# Patient Record
Sex: Male | Born: 1935 | Race: White | Hispanic: No | Marital: Married | Smoking: Former smoker
Health system: Southern US, Community
[De-identification: ages and names within clinical notes are randomized; demographics above are authoritative.]

## PROBLEM LIST (undated history)

## (undated) DIAGNOSIS — M1990 Unspecified osteoarthritis, unspecified site: Secondary | ICD-10-CM

## (undated) DIAGNOSIS — K449 Diaphragmatic hernia without obstruction or gangrene: Secondary | ICD-10-CM

## (undated) DIAGNOSIS — J449 Chronic obstructive pulmonary disease, unspecified: Secondary | ICD-10-CM

## (undated) DIAGNOSIS — J189 Pneumonia, unspecified organism: Secondary | ICD-10-CM

## (undated) DIAGNOSIS — E785 Hyperlipidemia, unspecified: Secondary | ICD-10-CM

## (undated) DIAGNOSIS — T8859XA Other complications of anesthesia, initial encounter: Secondary | ICD-10-CM

## (undated) DIAGNOSIS — F0390 Unspecified dementia without behavioral disturbance: Secondary | ICD-10-CM

## (undated) DIAGNOSIS — T4145XA Adverse effect of unspecified anesthetic, initial encounter: Secondary | ICD-10-CM

## (undated) DIAGNOSIS — K219 Gastro-esophageal reflux disease without esophagitis: Secondary | ICD-10-CM

## (undated) DIAGNOSIS — R569 Unspecified convulsions: Secondary | ICD-10-CM

## (undated) DIAGNOSIS — D509 Iron deficiency anemia, unspecified: Secondary | ICD-10-CM

## (undated) DIAGNOSIS — I10 Essential (primary) hypertension: Secondary | ICD-10-CM

## (undated) DIAGNOSIS — I5023 Acute on chronic systolic (congestive) heart failure: Secondary | ICD-10-CM

## (undated) DIAGNOSIS — I480 Paroxysmal atrial fibrillation: Secondary | ICD-10-CM

## (undated) DIAGNOSIS — E6609 Other obesity due to excess calories: Secondary | ICD-10-CM

## (undated) DIAGNOSIS — I509 Heart failure, unspecified: Secondary | ICD-10-CM

## (undated) HISTORY — PX: JOINT REPLACEMENT: SHX530

## (undated) HISTORY — DX: Paroxysmal atrial fibrillation: I48.0

## (undated) HISTORY — DX: Iron deficiency anemia, unspecified: D50.9

## (undated) HISTORY — DX: Gastro-esophageal reflux disease without esophagitis: K21.9

## (undated) HISTORY — DX: Hyperlipidemia, unspecified: E78.5

## (undated) HISTORY — DX: Diaphragmatic hernia without obstruction or gangrene: K44.9

## (undated) HISTORY — DX: Essential (primary) hypertension: I10

## (undated) HISTORY — PX: TOTAL KNEE ARTHROPLASTY: SHX125

## (undated) HISTORY — DX: Other obesity due to excess calories: E66.09

## (undated) HISTORY — DX: Unspecified osteoarthritis, unspecified site: M19.90

---

## 1999-02-20 ENCOUNTER — Ambulatory Visit: Admission: RE | Admit: 1999-02-20 | Discharge: 1999-02-20 | Payer: Self-pay | Admitting: Family Medicine

## 1999-05-16 ENCOUNTER — Encounter: Payer: Self-pay | Admitting: Orthopaedic Surgery

## 1999-05-22 ENCOUNTER — Inpatient Hospital Stay (HOSPITAL_COMMUNITY): Admission: RE | Admit: 1999-05-22 | Discharge: 1999-05-29 | Payer: Self-pay | Admitting: Orthopaedic Surgery

## 1999-05-23 ENCOUNTER — Encounter: Payer: Self-pay | Admitting: Orthopaedic Surgery

## 1999-05-24 ENCOUNTER — Encounter: Payer: Self-pay | Admitting: Internal Medicine

## 1999-05-29 ENCOUNTER — Inpatient Hospital Stay: Admission: RE | Admit: 1999-05-29 | Discharge: 1999-06-02 | Payer: Self-pay | Admitting: *Deleted

## 1999-06-04 ENCOUNTER — Encounter: Admission: RE | Admit: 1999-06-04 | Discharge: 1999-08-08 | Payer: Self-pay | Admitting: Family Medicine

## 1999-09-25 ENCOUNTER — Ambulatory Visit (HOSPITAL_BASED_OUTPATIENT_CLINIC_OR_DEPARTMENT_OTHER): Admission: RE | Admit: 1999-09-25 | Discharge: 1999-09-25 | Payer: Self-pay | Admitting: Orthopaedic Surgery

## 2003-05-27 ENCOUNTER — Encounter (INDEPENDENT_AMBULATORY_CARE_PROVIDER_SITE_OTHER): Payer: Self-pay | Admitting: Specialist

## 2003-05-27 ENCOUNTER — Ambulatory Visit (HOSPITAL_COMMUNITY): Admission: RE | Admit: 2003-05-27 | Discharge: 2003-05-27 | Payer: Self-pay | Admitting: Gastroenterology

## 2003-08-23 ENCOUNTER — Inpatient Hospital Stay (HOSPITAL_COMMUNITY): Admission: RE | Admit: 2003-08-23 | Discharge: 2003-08-30 | Payer: Self-pay | Admitting: Orthopedic Surgery

## 2003-08-26 ENCOUNTER — Encounter (INDEPENDENT_AMBULATORY_CARE_PROVIDER_SITE_OTHER): Payer: Self-pay | Admitting: Cardiology

## 2003-11-15 ENCOUNTER — Encounter: Admission: RE | Admit: 2003-11-15 | Discharge: 2003-11-25 | Payer: Self-pay | Admitting: Family Medicine

## 2003-11-22 ENCOUNTER — Encounter: Admission: RE | Admit: 2003-11-22 | Discharge: 2003-11-22 | Payer: Self-pay | Admitting: Family Medicine

## 2003-11-25 ENCOUNTER — Observation Stay (HOSPITAL_COMMUNITY): Admission: EM | Admit: 2003-11-25 | Discharge: 2003-11-27 | Payer: Self-pay

## 2003-12-05 ENCOUNTER — Encounter: Admission: RE | Admit: 2003-12-05 | Discharge: 2004-03-04 | Payer: Self-pay | Admitting: Family Medicine

## 2005-07-29 HISTORY — PX: CARDIOVASCULAR STRESS TEST: SHX262

## 2005-11-26 ENCOUNTER — Encounter: Admission: RE | Admit: 2005-11-26 | Discharge: 2005-11-26 | Payer: Self-pay | Admitting: Orthopedic Surgery

## 2005-12-06 ENCOUNTER — Encounter: Admission: RE | Admit: 2005-12-06 | Discharge: 2005-12-06 | Payer: Self-pay | Admitting: Orthopedic Surgery

## 2005-12-10 ENCOUNTER — Encounter: Admission: RE | Admit: 2005-12-10 | Discharge: 2005-12-10 | Payer: Self-pay | Admitting: Orthopedic Surgery

## 2006-02-11 ENCOUNTER — Inpatient Hospital Stay (HOSPITAL_COMMUNITY): Admission: RE | Admit: 2006-02-11 | Discharge: 2006-02-14 | Payer: Self-pay | Admitting: Orthopedic Surgery

## 2006-03-10 ENCOUNTER — Encounter: Admission: RE | Admit: 2006-03-10 | Discharge: 2006-04-14 | Payer: Self-pay | Admitting: Orthopedic Surgery

## 2006-03-26 ENCOUNTER — Inpatient Hospital Stay (HOSPITAL_COMMUNITY): Admission: RE | Admit: 2006-03-26 | Discharge: 2006-03-30 | Payer: Self-pay | Admitting: Orthopedic Surgery

## 2006-04-21 ENCOUNTER — Encounter: Admission: RE | Admit: 2006-04-21 | Discharge: 2006-05-27 | Payer: Self-pay | Admitting: Orthopedic Surgery

## 2007-08-27 ENCOUNTER — Encounter: Admission: RE | Admit: 2007-08-27 | Discharge: 2007-08-27 | Payer: Self-pay | Admitting: Orthopedic Surgery

## 2007-10-26 ENCOUNTER — Inpatient Hospital Stay (HOSPITAL_COMMUNITY): Admission: RE | Admit: 2007-10-26 | Discharge: 2007-10-31 | Payer: Self-pay | Admitting: Orthopedic Surgery

## 2007-10-26 ENCOUNTER — Encounter (INDEPENDENT_AMBULATORY_CARE_PROVIDER_SITE_OTHER): Payer: Self-pay | Admitting: Orthopedic Surgery

## 2007-11-16 ENCOUNTER — Encounter: Admission: RE | Admit: 2007-11-16 | Discharge: 2008-02-14 | Payer: Self-pay | Admitting: Orthopedic Surgery

## 2008-09-16 ENCOUNTER — Ambulatory Visit (HOSPITAL_COMMUNITY): Admission: RE | Admit: 2008-09-16 | Discharge: 2008-09-16 | Payer: Self-pay | Admitting: Family Medicine

## 2009-05-18 ENCOUNTER — Ambulatory Visit (HOSPITAL_COMMUNITY): Admission: RE | Admit: 2009-05-18 | Discharge: 2009-05-18 | Payer: Self-pay | Admitting: Orthopedic Surgery

## 2009-05-31 ENCOUNTER — Encounter: Admission: RE | Admit: 2009-05-31 | Discharge: 2009-06-19 | Payer: Self-pay | Admitting: Orthopedic Surgery

## 2009-08-29 HISTORY — PX: US ECHOCARDIOGRAPHY: HXRAD669

## 2009-09-14 ENCOUNTER — Inpatient Hospital Stay (HOSPITAL_COMMUNITY): Admission: EM | Admit: 2009-09-14 | Discharge: 2009-09-18 | Payer: Self-pay | Admitting: Emergency Medicine

## 2009-09-15 ENCOUNTER — Encounter (INDEPENDENT_AMBULATORY_CARE_PROVIDER_SITE_OTHER): Payer: Self-pay | Admitting: Internal Medicine

## 2009-10-25 LAB — CBC AND DIFFERENTIAL: Hemoglobin: 14.2 g/dL (ref 13.5–17.5)

## 2010-01-12 ENCOUNTER — Encounter: Admission: RE | Admit: 2010-01-12 | Discharge: 2010-01-12 | Payer: Self-pay | Admitting: Cardiology

## 2010-01-25 ENCOUNTER — Ambulatory Visit (HOSPITAL_COMMUNITY): Admission: RE | Admit: 2010-01-25 | Discharge: 2010-01-25 | Payer: Self-pay | Admitting: Cardiology

## 2010-01-25 HISTORY — PX: CARDIOVERSION: SHX1299

## 2010-03-02 ENCOUNTER — Ambulatory Visit: Payer: Self-pay | Admitting: Cardiology

## 2010-03-29 ENCOUNTER — Ambulatory Visit: Payer: Self-pay | Admitting: Cardiology

## 2010-04-30 ENCOUNTER — Ambulatory Visit: Payer: Self-pay | Admitting: Cardiology

## 2010-05-29 ENCOUNTER — Ambulatory Visit: Payer: Self-pay | Admitting: Cardiology

## 2010-08-06 ENCOUNTER — Encounter: Payer: Self-pay | Admitting: Cardiology

## 2010-08-06 DIAGNOSIS — I482 Chronic atrial fibrillation, unspecified: Secondary | ICD-10-CM | POA: Insufficient documentation

## 2010-08-06 DIAGNOSIS — E6609 Other obesity due to excess calories: Secondary | ICD-10-CM | POA: Insufficient documentation

## 2010-08-06 DIAGNOSIS — I10 Essential (primary) hypertension: Secondary | ICD-10-CM | POA: Insufficient documentation

## 2010-08-06 DIAGNOSIS — E118 Type 2 diabetes mellitus with unspecified complications: Secondary | ICD-10-CM | POA: Insufficient documentation

## 2010-08-29 ENCOUNTER — Ambulatory Visit (INDEPENDENT_AMBULATORY_CARE_PROVIDER_SITE_OTHER): Payer: 59 | Admitting: *Deleted

## 2010-08-29 DIAGNOSIS — I4892 Unspecified atrial flutter: Secondary | ICD-10-CM

## 2010-10-19 LAB — URINALYSIS, ROUTINE W REFLEX MICROSCOPIC
Bilirubin Urine: NEGATIVE
Glucose, UA: NEGATIVE mg/dL
Hgb urine dipstick: NEGATIVE
Ketones, ur: NEGATIVE mg/dL
Protein, ur: NEGATIVE mg/dL

## 2010-10-19 LAB — PROTIME-INR
INR: 1.11 (ref 0.00–1.49)
Prothrombin Time: 14.2 seconds (ref 11.6–15.2)

## 2010-10-19 LAB — BASIC METABOLIC PANEL WITH GFR
BUN: 15 mg/dL (ref 6–23)
CO2: 27 meq/L (ref 19–32)
Calcium: 8.6 mg/dL (ref 8.4–10.5)
Chloride: 105 meq/L (ref 96–112)
Creatinine, Ser: 0.82 mg/dL (ref 0.4–1.5)
GFR calc non Af Amer: >60 mL/min
Glucose, Bld: 112 mg/dL — ABNORMAL HIGH (ref 70–99)
Potassium: 3.9 meq/L (ref 3.5–5.1)
Sodium: 139 meq/L (ref 135–145)

## 2010-10-19 LAB — PROTEIN ELECTROPHORESIS, SERUM
Albumin ELP: 58.7 % (ref 55.8–66.1)
Alpha-1-Globulin: 6.3 % — ABNORMAL HIGH (ref 2.9–4.9)
Alpha-2-Globulin: 12.9 % — ABNORMAL HIGH (ref 7.1–11.8)
Beta 2: 3.8 % (ref 3.2–6.5)
Beta Globulin: 8.3 % — ABNORMAL HIGH (ref 4.7–7.2)
Gamma Globulin: 10 % — ABNORMAL LOW (ref 11.1–18.8)
M-Spike, %: NOT DETECTED g/dL
Total Protein ELP: 6.3 g/dL (ref 6.0–8.3)

## 2010-10-19 LAB — CBC
HCT: 23.1 % — ABNORMAL LOW (ref 39.0–52.0)
Hemoglobin: 7.1 g/dL — ABNORMAL LOW (ref 13.0–17.0)
Hemoglobin: 8.6 g/dL — ABNORMAL LOW (ref 13.0–17.0)
Hemoglobin: 8.7 g/dL — ABNORMAL LOW (ref 13.0–17.0)
MCHC: 30.7 g/dL (ref 30.0–36.0)
MCHC: 30.8 g/dL (ref 30.0–36.0)
Platelets: 259 10*3/uL (ref 150–400)
RBC: 3.81 MIL/uL — ABNORMAL LOW (ref 4.22–5.81)
RBC: 4.05 MIL/uL — ABNORMAL LOW (ref 4.22–5.81)
RBC: 4.06 MIL/uL — ABNORMAL LOW (ref 4.22–5.81)
RDW: 21.6 % — ABNORMAL HIGH (ref 11.5–15.5)
RDW: 23.2 % — ABNORMAL HIGH (ref 11.5–15.5)
RDW: 28.3 % — ABNORMAL HIGH (ref 11.5–15.5)
WBC: 9.2 10*3/uL (ref 4.0–10.5)

## 2010-10-19 LAB — GLUCOSE, CAPILLARY
Glucose-Capillary: 104 mg/dL — ABNORMAL HIGH (ref 70–99)
Glucose-Capillary: 106 mg/dL — ABNORMAL HIGH (ref 70–99)
Glucose-Capillary: 106 mg/dL — ABNORMAL HIGH (ref 70–99)
Glucose-Capillary: 113 mg/dL — ABNORMAL HIGH (ref 70–99)
Glucose-Capillary: 114 mg/dL — ABNORMAL HIGH (ref 70–99)
Glucose-Capillary: 114 mg/dL — ABNORMAL HIGH (ref 70–99)
Glucose-Capillary: 115 mg/dL — ABNORMAL HIGH (ref 70–99)
Glucose-Capillary: 125 mg/dL — ABNORMAL HIGH (ref 70–99)
Glucose-Capillary: 127 mg/dL — ABNORMAL HIGH (ref 70–99)
Glucose-Capillary: 50 mg/dL — ABNORMAL LOW (ref 70–99)
Glucose-Capillary: 66 mg/dL — ABNORMAL LOW (ref 70–99)
Glucose-Capillary: 70 mg/dL (ref 70–99)
Glucose-Capillary: 99 mg/dL (ref 70–99)

## 2010-10-19 LAB — TISSUE TRANSGLUTAMINASE, IGA: Tissue Transglutaminase Ab, IgA: 0.2 U/mL (ref <7)

## 2010-10-19 LAB — CARDIAC PANEL(CRET KIN+CKTOT+MB+TROPI)
CK, MB: 1.3 ng/mL (ref 0.3–4.0)
CK, MB: 1.4 ng/mL (ref 0.3–4.0)
Relative Index: INVALID (ref 0.0–2.5)
Relative Index: INVALID (ref 0.0–2.5)
Total CK: 66 U/L (ref 7–232)
Total CK: 69 U/L (ref 7–232)

## 2010-10-19 LAB — BASIC METABOLIC PANEL
BUN: 20 mg/dL (ref 6–23)
CO2: 27 mEq/L (ref 19–32)
Calcium: 8.9 mg/dL (ref 8.4–10.5)
Creatinine, Ser: 0.9 mg/dL (ref 0.4–1.5)
GFR calc Af Amer: >60 mL/min (ref >60)
GFR calc non Af Amer: >60 mL/min (ref >60)
GFR calc non Af Amer: >60 mL/min (ref >60)
Glucose, Bld: 78 mg/dL (ref 70–99)
Potassium: 4.5 mEq/L (ref 3.5–5.1)
Sodium: 138 mEq/L (ref 135–145)

## 2010-10-19 LAB — CROSSMATCH: Antibody Screen: NEGATIVE

## 2010-10-19 LAB — RETICULIN ANTIBODIES, IGA W TITER: Reticulin Ab, IgA: NEGATIVE

## 2010-10-19 LAB — ERYTHROPOIETIN: Erythropoietin: 140 m[IU]/mL — ABNORMAL HIGH (ref 2.6–34.0)

## 2010-10-19 LAB — PREPARE RBC (CROSSMATCH)

## 2010-10-19 LAB — DIFFERENTIAL
Basophils Relative: 0 % (ref 0–1)
Eosinophils Absolute: 0.1 10*3/uL (ref 0.0–0.7)
Lymphocytes Relative: 21 % (ref 12–46)
Neutrophils Relative %: 69 % (ref 43–77)

## 2010-10-19 LAB — TISSUE TRANSGLUTAMINASE, IGG: t-Transglutaminase (tTG) IgG: 0.1 U/mL

## 2010-10-19 LAB — POCT CARDIAC MARKERS
CKMB, poc: 1 ng/mL (ref 1.0–8.0)
Myoglobin, poc: 65.7 ng/mL (ref 12–200)

## 2010-10-19 LAB — IRON AND TIBC: Iron: 50 ug/dL (ref 42–135)

## 2010-10-19 LAB — COMPREHENSIVE METABOLIC PANEL
BUN: 22 mg/dL (ref 6–23)
CO2: 26 mEq/L (ref 19–32)
Calcium: 8.9 mg/dL (ref 8.4–10.5)
Creatinine, Ser: 0.81 mg/dL (ref 0.4–1.5)
GFR calc non Af Amer: >60 mL/min (ref >60)
Glucose, Bld: 80 mg/dL (ref 70–99)

## 2010-10-19 LAB — GLIADIN ANTIBODIES, SERUM: Gliadin IgG: 0.1 U/mL (ref <7)

## 2010-10-19 LAB — URINE CULTURE: Culture: NO GROWTH

## 2010-10-19 LAB — RETICULOCYTES
RBC.: 4.56 MIL/uL (ref 4.22–5.81)
Retic Count, Absolute: 50.2 10*3/uL (ref 19.0–186.0)
Retic Ct Pct: 1.1 % (ref 0.4–3.1)

## 2010-10-19 LAB — VITAMIN B12: Vitamin B-12: 262 pg/mL (ref 211–911)

## 2010-10-21 ENCOUNTER — Other Ambulatory Visit: Payer: Self-pay | Admitting: Cardiology

## 2010-10-21 DIAGNOSIS — I10 Essential (primary) hypertension: Secondary | ICD-10-CM

## 2010-10-25 NOTE — Telephone Encounter (Signed)
escribe request  

## 2010-10-25 NOTE — Telephone Encounter (Signed)
[facility] °

## 2010-11-13 LAB — CROSSMATCH: ABO/RH(D): O NEG

## 2010-11-13 LAB — GLUCOSE, CAPILLARY: Glucose-Capillary: 134 mg/dL — ABNORMAL HIGH (ref 70–99)

## 2010-11-26 ENCOUNTER — Encounter: Payer: Self-pay | Admitting: Cardiology

## 2010-12-03 ENCOUNTER — Encounter: Payer: Self-pay | Admitting: Nurse Practitioner

## 2010-12-03 ENCOUNTER — Ambulatory Visit (INDEPENDENT_AMBULATORY_CARE_PROVIDER_SITE_OTHER): Payer: 59 | Admitting: Nurse Practitioner

## 2010-12-03 DIAGNOSIS — I10 Essential (primary) hypertension: Secondary | ICD-10-CM

## 2010-12-03 DIAGNOSIS — R609 Edema, unspecified: Secondary | ICD-10-CM

## 2010-12-03 DIAGNOSIS — I4891 Unspecified atrial fibrillation: Secondary | ICD-10-CM

## 2010-12-03 NOTE — Assessment & Plan Note (Signed)
Blood pressure is marginal. He is not really interested in more medicines. I encouraged him to work harder on his diet and weight loss.

## 2010-12-03 NOTE — Patient Instructions (Addendum)
Stay on your current medicines. Try to limit your salt use. Please wear your support stockings for your swelling. I will have you see Dr. Patty Sermons in about 3 months.

## 2010-12-03 NOTE — Assessment & Plan Note (Signed)
I encouraged him to use his support stockings and to try and watch his salt. He is not interested in further medicines at this point. I will have him see Dr. Patty Sermons in about 2 months. Patient is agreeable to this plan and will call if any problems develop in the interim.

## 2010-12-03 NOTE — Assessment & Plan Note (Signed)
He remains in sinus by clinical exam. Protimes are checked by his PCP.

## 2010-12-03 NOTE — Progress Notes (Signed)
Omar Chan Date of Birth: 02-Jul-1926   History of Present Illness: Omar Chan is seen today for his 2 month visit. He is seen for Dr. Patty Chan.  Overall he seems to be holding his own. His only concern is of lower extremity edema. He tries to watch his salt but I get the impression that he probably has more than he should. He does not wear his support stockings. He is on HCTZ. It does go down somewhat overnight. He is not really interested in other medicines. He is not having chest pain. He has chronic shortness of breath. He is tolerating his medicines. He will be seeing Dr. Clarene Chan on Wednesday with labs and a visit.  Current Outpatient Prescriptions on File Prior to Visit  Medication Sig Dispense Refill  . acetaminophen (TYLENOL) 500 MG tablet Take 500 mg by mouth as needed.        Marland Kitchen atorvastatin (LIPITOR) 80 MG tablet Take 80 mg by mouth daily.        Marland Kitchen diltiazem (CARDIZEM CD) 180 MG 24 hr capsule Take 180 mg by mouth daily.        . ferrous sulfate 325 (65 FE) MG EC tablet Take 325 mg by mouth daily.       Marland Kitchen gemfibrozil (LOPID) 600 MG tablet Take 600 mg by mouth 2 (two) times daily before a meal.        . glipiZIDE (GLUCOTROL) 10 MG tablet Take 10 mg by mouth 2 (two) times daily before a meal.        . hydrochlorothiazide 25 MG tablet Take 25 mg by mouth daily.        Marland Kitchen lisinopril (PRINIVIL,ZESTRIL) 40 MG tablet Take 40 mg by mouth daily.        . metFORMIN (GLUCOPHAGE) 500 MG tablet Take 500 mg by mouth every morning.        . metFORMIN (GLUCOPHAGE) 500 MG tablet Take 1,000 mg by mouth nightly.        . metoprolol tartrate (LOPRESSOR) 25 MG tablet TAKE 1 TABLET TWICE A DAY  180 tablet  2  . warfarin (COUMADIN) 5 MG tablet Take 5 mg by mouth as directed.        . famotidine-calcium carbonate-magnesium hydroxide (PEPCID COMPLETE) 10-800-165 MG CHEW Chew 1 tablet by mouth daily as needed.          Allergies  Allergen Reactions  . Aspirin     Past Medical History  Diagnosis Date    . A-fib     cardioversion 01/25/10  . Diabetes mellitus   . Exogenous obesity   . Hypertension     Past Surgical History  Procedure Date  . Total knee arthroplasty     B    History  Smoking status  . Former Smoker  . Quit date: 07/29/1981  Smokeless tobacco  . Not on file    History  Alcohol Use No    Family History  Problem Relation Age of Onset  . Cancer Mother     deceased  . Pneumonia Father     deceased  . ALS Brother     deceased  . Cancer Brother     deceased/bone ca  . Cancer Sister     deceased    Review of Systems: The review of systems is positive for chronic DOE. He is not having palpitations. He thinks his rhythm has been ok. Coumadin is checked by Dr. Clarene Chan. He is not having any adverse bleeding or  bruising. He has not fallen. Gait is a little unsteady due to his arthritis.  All other systems were reviewed and are negative.  Physical Exam: BP 150/80  Pulse 60  Wt 272 lb (123.378 kg) Patient is very pleasant and in no acute distress. He is obese. Skin is warm and dry. Color is normal.  HEENT is unremarkable. Normocephalic/atraumatic. PERRL. Sclera are nonicteric. Neck is supple. No masses. No JVD. Lungs are clear. Cardiac exam shows a regular rate and rhythm. He does have an outflow murmur noted.  Abdomen is obese and soft. Extremities are with trace edema bilaterally.  Gait and ROM are intact. He is using a cane.  No gross neurologic deficits noted.  LABORATORY DATA: N/A   Assessment / Plan:

## 2010-12-11 NOTE — H&P (Signed)
Omar Chan, Omar Chan                ACCOUNT NO.:  [id number]   MEDICAL RECORD NO.:  [id number]          PATIENT TYPE:  INP   LOCATION:  NA                           FACILITY:  [facility]   PHYSICIAN:  Madlyn Frankel. Charlann Boxer, M.D.  DATE OF BIRTH:  06/12/1926   DATE OF ADMISSION:  10/26/2007  DATE OF DISCHARGE:                              HISTORY & PHYSICAL   PROCEDURE:  Revision right total knee arthroplasty.   CHIEF COMPLAINTS:  Right knee pain.   HISTORY OF PRESENT ILLNESS:  A 77 year old male with a history of  painful and swollen right total knee with bone scan revealing loose  prosthesis.  Lab work was negative for infection.  He has been  presurgically assessed for revision right total knee with  recommendations for close monitoring of his diabetes which is not well  controlled and spinal anesthesia preferable to general.   PAST MEDICAL HISTORY:  Significant for:  1. Osteoarthritis.  2. Diabetes, non-insulin dependent.  3. Hypertension.  4. Dyslipidemia.   PAST SURGICAL HISTORY:  1. Left knee replacement in 2000.  2. Right knee replacement in 2005.   FAMILY HISTORY:  Lung cancer, Lou Gehrig's disease.   SOCIAL HISTORY:  Married, retired.  Primary caregiver will be spouse  versus SNF postoperatively.   DRUG ALLERGIES:  ASPIRIN.   MEDICATIONS:  1. Gemfibrozil 600 mg one p.o. b.i.d.  2. Lipitor 80 mg one p.o. daily.  3. Prazosin capsules 2 mg two p.o. b.i.d.  4. Hydrochlorothiazide 25 mg 1/2 tablet daily.  5. Lisinopril 20 mg two p.o. daily.  6. Glipizide 10 mg one p.o. b.i.d.  7. Metformin 500 mg one p.o. daily.  8. Tylenol p.r.n..   Verify dosages and frequencies with the patient at time of admission.   REVIEW OF SYSTEMS:  HEMATOLOGY-ONCOLOGY:  Bruises easily.  Otherwise see  History of Present Illness.   PHYSICAL EXAMINATION:  VITAL SIGNS:  Pulse 84, respirations 18, blood  pressure 142/70.  GENERAL:  Awake, alert and oriented, mild distress.  NECK:  Supple.  No  carotid bruits.  CHEST:  Lungs clear to auscultation bilaterally.  BREASTS:  Deferred.  HEART:  Regular rate and rhythm.  S1-S2 distinct.  ABDOMEN:  Soft, nontender, bowel sounds present.  GENITOURINARY:  Deferred.  EXTREMITIES:  Uses a single-point cane.  Unable to palpate dorsalis  pedis pulse.  He does have a brisk capillary refill.  Bilateral lower  extremity edema to mid calf.  SKIN:  No signs of cellulitis.  NEUROLOGIC:  Intact distal sensibilities.   Labs, EKG, chest x-ray pending presurgical testing.   IMPRESSION:  Aseptic loosening right total knee arthroplasty.   PLAN OF ACTION:  Revision right total knee arthroplasty at [facility] on October 26, 2007, by surgeon Dr. Durene Romans.  Risks and  complications were discussed.  Dr. Charlann Boxer plans on a hypertype fixation  with Press-Fit stems, cementing proximal portion of the tibia and femur.   Postoperative medications were not dispensed at time of History and  Physical as he may require SNF placement postoperatively.     ______________________________  Sharlet Salina  Chanetta Marshall, PA      [name], M.D.  Electronically Signed    BLM/MEDQ  D:  10/19/2007  T:  10/19/2007  Job:  [id number]   cc:   Chales Salmon. Abigail Miyamoto, M.D.  Fax: [phone]

## 2010-12-11 NOTE — Discharge Summary (Signed)
NAMESAMANTHA, Omar Chan                ACCOUNT NO.:  [id number]   MEDICAL RECORD NO.:  [id number]          PATIENT TYPE:  INP   LOCATION:  1612                         FACILITY:  [facility]   PHYSICIAN:  Madlyn Frankel. Charlann Boxer, M.D.  DATE OF BIRTH:  10/30/1925   DATE OF ADMISSION:  10/26/2007  DATE OF DISCHARGE:  10/31/2007                               DISCHARGE SUMMARY   ADMITTING DIAGNOSES:  1. Failed right total knee arthroplasty.  2. Osteoarthritis.  3. Diabetes.  4. Hypertension.  5. Dyslipidemia.   DISCHARGE DIAGNOSIS:  1. 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  2. Osteoarthritis.  3. Diabetes.  4. Hypertension.  5. Dyslipidemia.  6. Urinary retention.  7. Hypoglycemia.   HISTORY OF PRESENT ILLNESS:  A 77 year old male with a history painful  and swollen right total knee with bone scan revealing loose prosthesis.  Lab work was negative for infection.  Presurgically assessed for  revision right total knee replacement.  Close monitoring of diabetes  recommended as well spinal anesthesia preferable to general.   CONSULTANTS:  Dr. Rito Ehrlich for elevated heart rate.   PROCEDURE:  Revision right total knee arthroplasty.   SURGEON:  Dr. Durene Romans.   ASSISTANT:  Dwyane Luo PA-C.   LABS ON ADMISSION:  CBC:  White blood cells 7.3, hemoglobin 13.2,  hematocrit 39.1, platelets 225, tracked throughout his course of stay.  He did have some moderate blood loss.  White blood cell was 6,  hemoglobin 8.5, hematocrit 24, platelets 161 prior to discharge.  White  cell differential all within normal limits.  Coags:  His PT was 13.4,  INR one.  His PTT was 42.  D-dimer on March 31 was  1.92.  CMET and  routine chemistries on admission:  Sodium 140, potassium 3.9, glucose  166, creatinine 0.8.  Prior to discharge his sodium was 138, potassium  4.4, glucose 54 which restabilized afterwards and creatinine 0.69.  His  kidney function remained normal with his calcium 8 at discharge.  Cardiac markers performed while in the hospital with no sign of  myocardial infarction.  His troponin 0.01.  His urine was negative for  nitrites.  Synovial right knee negative for infection.  Urine clean  catch on the April 1 showed no culture growth.   Cardiology:  EKG normal sinus rhythm.  A run of SVT on April 1 which  resolved prior to discharge.   Radiology:  Chest two-view stable with probable changes of COPD, small  hiatal hernia.  CT angiography chest, no evidence of PE.  Small foci of  consolidation at the posterior lung bases with atelectasis, pneumonia or  aspiration pneumonitis, differential moderate hiatal hernia.   HOSPITAL COURSE:  The patient admitted to hospital for revision right  total knee arthroplasty.  Surgery performed on the March 30, admitted to  orthopedic floor.  We saw him on March 31.  He was afebrile, doing well.  We discontinued his Hemovac.  Nursing made a note that 6 o'clock on that  day he had elevated heart rate.  PA on call came in to see him, called a  cardiology consult, did a chest x-ray and got some cardiac enzymes which  were negative for acute myocardial infarction.  Placed a Foley due to  urinary retention.  We saw him the next day.  He was afebrile.  Wound  had some dried-on blood but no active drainage.  Xeroform was reapplied  to the dressing.  Neurovascularly intact.  He is weightbearing as  tolerated.  We maintained the Foley catheter for now.  He made a couple  of steps with physical therapy but very limited.  The hospitalist  continued to follow along.  He had low glucose of 54, hypoglycemic  event, stabilized afterwards.  We encouraged sitting in chair as he had  made limited progress of this point, encouraged incentive spirometer,  heplocked his IV, d/c'd his Foley, started him on Flomax.  Made very,  very slow progress.  Medicine continued to follow-up for his medical  conditions, was well maintained and managed.  Ortho staff saw him  on  April 3. He was doing okay.  The right knee was dry.  Plan was to DC him  on Flomax.  Medicine saw him and recommended some Avelox for potential  pneumonia.  He was able to urinate without a Foley.  Seen by Ortho on  April 4, was stable, afebrile, able to urinate, no significant drainage  or signs of infection of the wound.  He was ready for discharge home.   DISCHARGE MEDICATIONS:  1. Lovenox 40 mg subcu q. 24 x10 days.  2. Robaxin 500 mg p.o. q. 6 for muscle spasm.  3. Vicodin 5/325 p.o. q. 4-6 pain.  4. Aspirin 325 mg 1 p.o. daily x4 weeks.  5. Gemfibrozil 600 mg p.o. q.a.m., p.o. q.p.m..  6. Lipitor 80 mg 1 p.o. q.a.m.  7. __________  caps 2 mg 2 p.o. q.a.m. and 2 p.o. q.p.m.  8. HCTZ 25 mg 1 p.o. q.a.m.  9. Lisinopril 20 mg 2 p.o. q.a.m.  10.Glipizide XL tabs 10 mg 1 p.o. q.a.m., 1 p.o. q.p.m.  11.Metformin 500 mg 1 p.o. q.p.m.  12.Tylenol 500 mg p.r.n.  13.Flomax 0.4 mg p.o. daily.   DISCHARGE WOUND CARE:  Keep dry.   DISCHARGE DIET:  Diabetic regular.   DISCHARGE FOLLOWUP:  Follow up with Dr. Charlann Boxer [phone] in two weeks for  wound check.     ______________________________  Yetta Glassman. [address], [place]      Madlyn Frankel. Charlann Boxer, M.D.  Electronically Signed    BLM/MEDQ  D:  12/07/2007  T:  12/07/2007  Job:  [id number]

## 2010-12-11 NOTE — Op Note (Signed)
NAMEYERAY, TOMAS                ACCOUNT NO.:  [id number]   MEDICAL RECORD NO.:  [id number]          PATIENT TYPE:  INP   LOCATION:  0009                         FACILITY:  [facility]   PHYSICIAN:  Madlyn Frankel. Charlann Boxer, M.D.  DATE OF BIRTH:  1926-02-08   DATE OF PROCEDURE:  10/26/2007  DATE OF DISCHARGE:                               OPERATIVE REPORT   PREOPERATIVE DIAGNOSIS:  Failed right total knee replacement, which  appeared to be aseptic based on preoperative workup.   POSTOPERATIVE DIAGNOSIS:  Failed right total knee replacement, which  appeared to be aseptic based on preoperative workup.   FINDINGS:  During the operation I found that the tibial component was  easy to remove after and extensive amount of time probably 15 to 20  minutes of trying to remove the femoral stem. I determined that it was  not loose. I did not end up removing this component.   PROCEDURE:  Revision right knee replacement.   COMPONENTS USED:  We used a Dupuy MBP revision tray size 4 with a 10 mm  size 3 medial augment and a 10 mm lateral augment times four. I used a  75 x 20 mm stem. A 12.5 posterior stabilizing insert was utilized.   SURGEON:  Charlann Boxer.   ASSISTANT:  Glennis Brink, MD   ANESTHESIA:  General both with regional femoral block.   DRAINS:  Times one.   COMPLICATIONS:  None.   TOURNIQUET TIME:    120 minutes at 300 mmHg.   DESCRIPTION OF PROCEDURE:  Mr. Facundo is a 77 year old gentleman, who  presented to me with a painful revised total knee replacement. Initially  he had been doing fairly well with some difficulty with obtaining range  of motion.  He had persistent pain with increased activity. Workup for  this was negative for infection. Given this persistent discomfort, he  wished to proceed with knee replacement surgery. I discussed the risks  and potential concerns of infection. In addition to the risk of it  failing again and needing further surgery versus the standard risk of  infection  due to his diabetes, deep vein thrombosis. . Consent was  obtained.   PROCEDURE IN DETAIL:  The patient was brought to the operative theatre.  Anesthesia was administered. He was prepped and draped.  The patient was  placed in the supine position. Right thigh tourniquet was placed on the  right lower extremity.  The right lower extremity was prepped and  draped. Midline incision was made. The patient knee, skin and  subcutaneous tissue were noted to be very rigid consistent with a  patient with diabetes.  There were no signs of obvious distention.  At  this time, I was able to create soft tissue flap over this  I did do a  medial arthrotomy. I explored it and the joint was completely normal  with synovial looking fluid straw colored.  Cultures were taken. Further  debridement was carried out with exposure. I did do a quadricep snip to  help with the exposure due to stiffness of his tissue.   Once I had  done my debridement exposing this suspension mechanism, I was  able to flex the knee to 90 degrees. I used an osteootome to remove the  polyethylene insert.  Again, no signs of infection posteriorly.  A half  inch osteotome was utilized to elevate the tibial tray, which was then  fairly easy.  I was able to get enough exposure to remove this. I spent  time at this point removing the cement.  After removing most of the  cement other than down the portion of the proximal fibula, I tented the  femur. I spent approximately 20 to 30 minutes trying to debride this  component. This was done based on the findings of the bone scan a small  amount of uptake.  There was no surrounding infection and no drainage.  After a significant amount of effort I was unable to remove the femoral  component.   Based on the removal of some of the cement, I decided at the end of the  case I would pack cement back in around this area to help bone with  healing.   At this point, attention was redirected to the  tibia.  The remainder of  the cement was removed. I used the cement restricter insert to remove  the old cement restrictor.   I then began reaming. The plan was to use the cement proximally nad  press fit distally.  Once I was able to hand ream __________  trial  reduction. Initially, I had planned to do a 20 mm stem and ream  appropriately. Based on the fact that __________  I did do a portion of  __________   A trial reduction was carried out with initially the 20 mm x 75 mm stem.  The 10 augment we __________  laterally. With this I was able to do the  10 mm insert __________   At this point, all trial components were removed. The trials then closed  with __________  size 4 and BP revision. Initially, I did open up the 17  x 20 mm stem.   I used the size 3 10 mm medial augment and a size 4 lateral 10 mm  augment.   At this point, the entire knee was cleaned, irrigated with pulse lavage  any remaining cement as necessary. Cut surface of the tibia was  __________ .  I debrided around the __________ .  Once I had done this,  I dried the knee and cement was mixed.  The component was made on the  back table and cement mixed. I passed the component but unfortunately  did not have any purchase. At this point, we very quickly added the 2 mm  stem. This was placed in the component. There was no concern about the  cement hardening at this point due to the fact that I had used the HB  DePuy cement which had a longer setting up time.  This component was  impacted down with a good initial strength.  Excessive cement was  removed as I brought the knee out of extension with 12.5 insert.  The  knee came to full extension and the ligaments appeared to stable and  balanced.   At this  point, the knee was copiously irrigated with normal saline  solution. I placed the final 5 x 12.5 posterior stabilized insert.   A Hemovac drain was placed deep; reapproximated the extensor mechanism  including  the quad snip portion with a #1 vicrylwithout difficulty  Again the  subcutaneous layer was reapprioxiamted using a 2-0 vicryl and  chose to use 2-0 nylon on the skin due to its rigid nature.  The skin  was clean, dry and dressed thoroughly with a sterile dressing.  The  patient was brought to the recovery room in stable condition having  tolerated the procedure well.      [name], M.D.  Electronically Signed     MDO/MEDQ  D:  10/26/2007  T:  10/26/2007  Job:  [id number]

## 2010-12-11 NOTE — Consult Note (Signed)
Omar Chan, Omar Chan                ACCOUNT NO.:  [id number]   MEDICAL RECORD NO.:  [id number]          PATIENT TYPE:  INP   LOCATION:  1612                         FACILITY:  [facility]   PHYSICIAN:  Hollice Espy, M.D.DATE OF BIRTH:  05-24-1926   DATE OF CONSULTATION:  10/27/2007  DATE OF DISCHARGE:                                 CONSULTATION   PRIMARY CARE PHYSICIAN:  Chales Salmon. Abigail Miyamoto, M.D.   REASON FOR CONSULTATION:  Elevated heart rate.   HISTORY OF PRESENT ILLNESS:  The patient is a 77 year old white male  with past medical history of diabetes mellitus, morbid obesity, and  hypertension, who was admitted on October 26, 2007 for a revision of his  right total knee.  The patient was evaluated.  He had had a prosthetic  right total knee put in and lately was having painful swelling of that  knee.  After evaluation he was found to have a loose prosthesis.  The  patient was admitted and underwent a revision of the right knee.  Fortunately after an evaluation by Dr. Charlann Boxer, the patient did not have to  have the part of the component removed and tolerated the procedure well.  By March 31 he was doing well, starting to ambulate, and the plan was  for him to get him to [facility] skilled nursing when in the  evening of March 31 he continued pulse ox, noting that his heart rate  was logged in at 163.  The patient himself appeared to be asymptomatic  from this.  No evidence of chest pain or shortness of breath.  An EKG  was done which showed some atrial tachycardia with an ARQRS complex.  The patient did not have any medications given and converted to sinus  rhythm a short while later.  [facility] were called for  evaluation and consultation.   REVIEW OF SYSTEMS:  Currently the patient himself seems to be doing  well.  His only complaint is of pain in that leg area.  He says since  his PC has been discontinued he is having more painful spells, he has  trouble sleeping, but  otherwise he says he is having no episodes of  chest pain, past or present, and his breathing at times is a little  labored, but he says is completely at his baseline.  He denies any  headaches, vision changes.  No dysphagia, no chest pain or palpitations,  no shortness of breath, wheezing, or coughing.  No abdominal pain.  No  hematuria or dysuria.  No diarrhea.  No focal extremity numbness,  weakness, or pain other than his leg as described above.  Review of  systems otherwise negative.   PAST MEDICAL HISTORY:  1. Obesity.  2. Hypertension.  3. Diabetes mellitus.  4. History of BPH.  5. Osteoarthritis.  6. The patient underwent a successful stress test in June 2007.  An      old echocardiogram done in January of 2005 four years ago notes      dynamic left ventricular outflow tract obstruction with mild  increase in aortic valve thickness, but otherwise normal echo and      preserved ejection fraction.   MEDICATIONS:  1. The patient currently in the hospital is on Lipitor 80.  2. Colace 100 b.i.d.  3. Lovenox 40.  4. Iron 325 t.i.d.  5. Lopid 600 b.i.d.  6. Glucotrol 10.  7. HCTZ 25.  8. Insulin sliding scale.  9. Lisinopril 40 which has been put on hold today after his episode.  10.Glucophage 500.  11.P.r.n. Percocet.  12.P.r.n. MiraLax.  13.Terazosin four p.o. b.i.d.  14.Senokot.  15.Normal saline with fluids 100 ml an hour.  16.P.r.n. Tylenol as allotted.  17.Reglan.  18.Robaxin.  19.Ambien.   SOCIAL HISTORY:  Currently the patient denies any alcohol, tobacco, or  drug use.   FAMILY HISTORY:  Noncontributory.   PHYSICAL EXAMINATION:  VITAL SIGNS:  Most recent set of vitals:  Temperature 97.6, heart rate runs around 92, respirations 20, blood  pressure 95/42, O2 saturation 96% on 2 L.  GENERAL:  He is alert and oriented x3 and in some mild distress  secondary to pain.  HEENT:  Normocephalic, atraumatic.  Mucous membranes are slightly dry.  NECK:  He has no  carotid bruits.  HEART:  Currently regular, normal sinus rhythm, S1/S2.  Soft 2/6  systolic ejection murmur.  LUNGS:  Decreased breath sounds throughout secondary to body habitus.  ABDOMEN:  Soft, obese, nontender.  Positive bowel sounds.  EXTREMITIES:  No clubbing, cyanosis, but a trace to 1+ pitting edema.  MUSCULOSKELETAL:  I deferred the musculoskeletal exam secondary to his  recent surgery.   LABORATORY DATA:  D-dimer is elevated at 1.92.  CPK 138, MB 1.6,  troponin I 0.01.  Sodium 142, potassium 4.3, chloride 111, bicarb 25,  BUN 21, creatinine 1.1, glucose 151.  White count 9.2, H&H 9.5 and 27,  platelet count 156.   In review of his EKG it looks to be an attack of possible run of SVT.   ASSESSMENT/PLAN:  1. Tachycardia.  SVT versus atrial tachycardia.  Likely this may be      possibly secondary to the pain or volume depletion.  Agree with      continuing the patient on IV fluids.  He shows no signs of volume      overload.  He is asymptomatic from this.  Recommend keeping him on      telemetry, continuing to monitor at this time and would not      continue any other aggressive intervention.  We need to be properly      managing his pain to prevent future stress which can lead to      tachycardia.  See below for discussion of anemia.  2. Postoperative hemorrhagic blood loss.  Note that the patient's      hemoglobin dropped in approximately 14  hours from 10.8 down to      9.5.  Expect this is postoperative, but will check a repeat H&H.      If it has dropped further consider blood transfusion.  3. Status post knee revision.  Ambulate as per PT/OT.  4. Diabetes mellitus.  Continue sliding scale, p.o. medications.  5. Hypertension.  Will continue to monitor.  The patient may need a      chronotropic agent such as Cardizem or beta blocker.  His heart      rate remains on the higher end of normal.      Hollice Espy, M.D.  Electronically Signed  SKK/MEDQ  D:   10/27/2007  T:  10/28/2007  Job:  [id number]   cc:   [name], M.D.  Fax: [phone]   Chales Salmon. Abigail Miyamoto, M.D.  Fax: [phone]

## 2010-12-14 NOTE — Op Note (Signed)
Omar Chan, Omar Chan                ACCOUNT NO.:  [id number]   MEDICAL RECORD NO.:  [id number]          PATIENT TYPE:  INP   LOCATION:  1617                         FACILITY[facility]  [facility]   PHYSICIAN:  Shirley Friar, MDDATE OF BIRTH:  Aug 19, 1925   DATE OF PROCEDURE:  03/28/2006  DATE OF DISCHARGE:                                 OPERATIVE REPORT   INDICATION:  Hematemesis.   MEDICATIONS:  1. Fentanyl 50 mcg IV.  2. Versed 4 mg IV.   FINDINGS:  Endoscope was inserted into the oropharynx and esophagus was  intubated.  In the distal part of the esophagus was circumferential  erythema, ulceration and flat black eschars consistent with severe erosive  esophagitis.  No active bleeding was seen.  Endoscope was advanced through  this area which extended from 35 cm to the GE junction at 40 cm.  A medium-  sized hiatal hernia was noted upon insertion into the stomach.  Endoscope  was advanced down into the stomach which was normal in its appearance  including normal angularis, cardia, fundus, body and antrum, except for the  hiatal hernia as noted above.  Endoscope was advanced down through the  duodenal bulb and second portion of duodenum which were both normal.  Endoscope was withdrawn back into the stomach and then into the esophagus  and esophagitis was again evaluated.  Endoscope was then withdrawn and  confirmed the above findings.   ASSESSMENT:  1. Severe erosive esophagitis.  2. Medium sized hiatal hernia.   PLAN:  1. IV Protonix 40 mg q.12 hours and discontinue Pepcid.  2. Carafate slurry 1 g/10 mL p.o. 1 hour before meals and at bedtime.  3. Clear liquid diet and advance as tolerated to previous diet.  4. Okay from GI standpoint to resume anticoagulation, but would defer      timing of this to surgery.      Shirley Friar, MD  Electronically Signed     VCS/MEDQ  D:  03/28/2006  T:  03/30/2006  Job:  [id number]   cc:   [name], M.D.  Fax: [phone]

## 2010-12-14 NOTE — Op Note (Signed)
   Omar Chan, Omar Chan                          ACCOUNT NO.:  [id number]   MEDICAL RECORD NO.:  [id number]                   PATIENT TYPE:  AMB   LOCATION:  ENDO                                 FACILITY:  [facility]   PHYSICIAN:  John C. Madilyn Fireman, M.D.                 DATE OF BIRTH:  02-Jan-1926   DATE OF PROCEDURE:  05/27/2003  DATE OF DISCHARGE:                                 OPERATIVE REPORT   PROCEDURE:  Colonoscopy.   INDICATIONS FOR PROCEDURE:  Anemia with hemoglobin of 10.8.   DESCRIPTION OF PROCEDURE:  The patient was placed in the left lateral  decubitus position and placed on the pulse monitor with continuous low-flow  oxygen delivered by nasal cannula.  He was sedated with 62.5 mcg IV fentanyl  and  5 mg IV Versed.  The Olympus video colonoscope was inserted into the  rectum and advanced to the cecum, confirmed by transillumination of  McBurney's point and visualization of the ileocecal valve and appendiceal  orifice.  Prep was excellent.  The cecum, ascending and transverse colon all  appeared normal with no masses, polyps, diverticula, or other mucosal  abnormalities.  In the descending sigmoid colon, there were multiple  diverticula.  No other abnormalities.  The rectum appeared normal and  retroflexed view of the anus revealed no obvious internal hemorrhoids.  The  colonoscope was then withdrawn and the patient returned to the recovery room  in stable condition.  He tolerated the procedure well and there were no  immediate complications.   IMPRESSION:  Diverticulosis; otherwise normal study.   PLAN:  Will proceed with EGD.                                                John C. Madilyn Fireman, M.D.    JCH/MEDQ  D:  05/27/2003  T:  05/27/2003  Job:  [id number]   cc:   Chales Salmon. Abigail Miyamoto, M.D.  [address]  [place]  [place] [address]  Fax: [phone]

## 2010-12-14 NOTE — Consult Note (Signed)
[name], [name]                          ACCOUNT NO.:  [id number]   MEDICAL RECORD NO.:  [id number]                   PATIENT TYPE:  INP   LOCATION:  0163                                 FACILITY:  [facility]   PHYSICIAN:  Corinna L. Lendell Caprice, MD             DATE OF BIRTH:  1926/05/19   DATE OF CONSULTATION:  08/26/2003  DATE OF DISCHARGE:                                   CONSULTATION   REASON FOR CONSULTATION:  Heart rate of 180.   IMPRESSION/RECOMMENDATIONS:  1. Supraventricular tachycardia. Patient has been transferred to the     intensive care unit and given 6 mg of adenosine IV and has subsequently     converted to normal sinus rhythm. The patient has a history of atrial     flutter in 2000 according to records. I will rule out myocardial     infarction with serial enzymes and troponins. I will check a TSH,     echocardiogram, electrolytes, and also a D-dimer.  If other workup is     negative and if the D-dimer is elevated, he may need to have a PE ruled     out as the etiology. If he remains in normal sinus rhythm and remains     otherwise stable, he can be transferred to telemetry soon.  2. Right total knee arthroplasty. The patient is on DVT prophylaxis.  3. Diabetes. His control is margin. Continue sliding scale. His medications     may need to be adjusted.  4. Hypertension. If his supraventricular tachycardia continues, we may need     to change his antihypertensives to a calcium channel blocker or beta     blocker.  5. Hyperlipidemia. His medications have been continued.  6. History of gastrointestinal bleed in 2000 secondary to erosive     esophagitis. I will add Pepcid while patient is on anticoagulation.   HISTORY OF PRESENT ILLNESS:  Mr. Omar Chan is a pleasant 77 year old patient,  whose primary care physician is Dr. Abigail Miyamoto, who is hospitalized after a  total knee arthroplasty. He has severe DJD. I was called at  a.m. with a  STAT consult by the physician's assistant  for a heart rate of 180. The  patient was on the orthopedic floor and was not on a monitor at that time.  He was completely asymptomatic. He had no chest pain. He had some slight  shortness of breath, but could not feel any fibrillation. He was moved to  the intensive care unit to control his rate. There was a single reading of a  heart rate of 150 a few days ago, but otherwise his heart rate has been  below 100.   PAST MEDICAL HISTORY:  As above.   MEDICATIONS:  1. Dilaudid PCA.  2. Colace.  3. Iron.  4. Subcutaneous heparin.  5. Warfarin protocol.  6. Prazosin 2 mg p.o. b.i.d.  7. Avandia 4 mg p.o.  b.i.d.  8. Lopid 600 mg p.o. daily.  9. Lipitor 40 mg p.o. daily.  10.      Glucotrol 10 mg p.o. daily.  11.      Lisinopril 10 mg p.o. daily.  12.      Sliding scale insulin.  13.      Ancef.   SOCIAL HISTORY:  The patient is married. He does not smoke or drink.   FAMILY HISTORY:  Noncontributory.   REVIEW OF SYSTEMS:  CONSTITUTIONAL: No fevers or chills. HEENT: No headache  and no dysphagia. RESPIRATORY: As above. No cough. CARDIOVASCULAR: As above.  GI: No nausea, vomiting, or diarrhea. GU: No dysuria or hematuria.  MUSCULOSKELETAL: The patient is controlled. He has no calf pain. SKIN: No  rash. PSYCHIATRIC: No depression. NEUROLOGIC: No seizures or stroke history.  ENDOCRINE: His blood glucose measurements have bene ranging have been  ranging 150 to 200.   PHYSICAL EXAMINATION:  VITAL SIGNS: On physical examination, his heart rate  initially was about 180 and his blood pressure was about 100 systolic,  respiratory rate 16, and he is afebrile.  GENERAL: The patient is an obese white male in no acute distress. He is  asleep and easily arousable.  HEENT:  Normocephalic and atraumatic. Pupils equal, round, and reactive to  light. Sclerae nonicteric. Moist mucous membranes.  NECK: Thick, supple; no carotid bruits.  LUNGS: Clear to auscultation bilaterally without rales,  rhonchi, or wheezes.  CARDIOVASCULAR: A very rapid rate with no murmurs, rubs, or gallops  appreciated.  ABDOMEN: Soft, nontender, nondistended.  GU/RECTAL:  Deferred.  EXTREMITIES: He has a clean and dry dressing on his right knee. Kendalls are  in place. He has some slight swelling in his right leg. No calf tenderness.  Homan's sign negative.  NEUROLOGIC: Alert and oriented. Cranial nerves, sensory, and motor exams are  intact.  PSYCHIATRIC: Normal affect.  SKIN: No rash.   LABORATORY DATA:  His CBC today is significant for a hemoglobin of 9.8,  hematocrit 29. Yesterday his INR was 1.5. EKG shows SVT with a rate of 180  and slight ST depression laterally. After conversion to normal sinus rhythm,  his EKG shows a normal sinus rhythm and poor R-wave progression.   ASSESSMENT/PLAN:  As above. I would like to thank Dr. Darrelyn Hillock for this  consultation and I will continue to follow as needed.                                               Corinna L. Lendell Caprice, MD    CLS/MEDQ  D:  08/26/2003  T:  08/26/2003  Job:  [id number]   cc:   Chales Salmon. Abigail Miyamoto, M.D.  [address]  [place]  [place] [address]  Fax: [phone]

## 2010-12-14 NOTE — H&P (Signed)
NAMESOPHEAP, BOEHLE                          ACCOUNT NO.:  [id number]   MEDICAL RECORD NO.:  [id number]                   PATIENT TYPE:  INP   LOCATION:  NA                                   FACILITY:  [facility]   PHYSICIAN:  Georges Lynch. Gioffre, M.D.             DATE OF BIRTH:  06/20/1926   DATE OF ADMISSION:  08/23/2003  DATE OF DISCHARGE:                                HISTORY & PHYSICAL   HISTORY:  The patient has had right knee pain for the past several years.  Over the past few months, he has had increasing pain, making it difficult  for him to ambulate.  He is using a cane.  He has used Hyalgan injections in  the past, as well as cortisone.  He has not been able to take medicines for  inflammation because he is allergic to aspirin.  The patient had a left  total knee arthroplasty in 2000.  He has done very well following that  procedure, and elects to proceed with a right total knee arthroplasty.   ALLERGIES:  ASPIRIN causes a rash.   PRIMARY CARE Danyele Smejkal:  Chales Salmon. Abigail Miyamoto, M.D.   PAST MEDICAL HISTORY:  1. Hypertension.  2. Noninsulin-dependent diabetes.  3. Hypercholesterolemia.  4. Hiatal hernia.  5. Degenerative arthritis.   CURRENT MEDICATIONS:  1. Avandia 4 mg twice daily.  2. Lipitor 40 mg daily.  3. Glyburide ER 10 mg daily.  4. Gemfibrozil 600 mg b.i.d.  5. Lisinopril 10 mg daily.  6. Prazosin 2 mg b.i.d.   PREVIOUS SURGICAL HISTORY:  The patient had a left total knee arthroplasty  in 2000.   FAMILY HISTORY:  Mother had lung cancer.   REVIEW OF SYSTEMS:  GENERAL:  Denies weight change, fever, chills, fatigue.  HEENT:  Denies headache, visual changes, tinnitus, hearing loss or sore  throat.  CARDIOVASCULAR:  Denies chest pain, palpitations, shortness of  breath, or orthopnea.  PULMONARY:  Denies dyspnea, recent cough, sputum  production, hemoptysis.  GI:  Denies dysphagia, nausea, vomiting,  hematemesis, or abdominal pain.  GU:  Denies dysuria, frequency,  or  hematuria.  ENDOCRINE:  Denies polyuria, polydipsia, appetite change, heat  or cold intolerance.  MUSCULOSKELETAL:  The patient does have severe right  knee pain.  NEUROLOGIC:  Denies dizziness, vertigo, syncope, seizures.  SKIN:  Denies itching, rashes, masses, or moles.   PHYSICAL EXAMINATION:  VITAL SIGNS:  Temperature is 97.5, pulse 72,  respirations 18, blood pressure 140/70 right arm sitting.  GENERAL:  A 77 year old male in no acute distress.  HEENT:  Pupils are equal, round and reactive to light.  Extraocular  movements intact.  Pharynx clear.  NECK:  Supple without masses.  CHEST:  Clear to auscultation bilaterally.  No wheezes, rales, or rhonchi  noted.  HEART:  He does have a 3/6 systolic ejection murmur.  ABDOMEN:  Positive bowel sounds, soft, nontender.  EXTREMITIES:  He  has decreased range of motion of his right knee with  grating sensation with range of motion.  SKIN:  Warm and dry.   X-ray of his right knee reveals significant degenerative changes with mild  genu varus.   IMPRESSION:  Degenerative arthritis, right knee.   PLAN:  The patient is to be admitted to [facility] on August 23, 2003 to undergo a right total knee arthroplasty.     Ebbie Ridge. Paitsel, P.A.                     Ronald A. Darrelyn Hillock, M.D.    Tilden Dome  D:  08/19/2003  T:  08/19/2003  Job:  [id number]

## 2010-12-14 NOTE — Discharge Summary (Signed)
Omar Chan, Omar Chan                          ACCOUNT NO.:  [id number]   MEDICAL RECORD NO.:  [id number]                   PATIENT TYPE:  INP   LOCATION:  0354                                 FACILITY:  [facility]   PHYSICIAN:  Georges Lynch. Darrelyn Hillock, M.D.             DATE OF BIRTH:  05-24-1926   DATE OF ADMISSION:  08/23/2003  DATE OF DISCHARGE:  08/30/2003                                 DISCHARGE SUMMARY   ADMISSION DIAGNOSES:  1. Degenerative arthritis, right knee.  2. Hypertension.  3. Non-insulin dependent diabetes.  4. Hypercholesterolemia.  5. Hiatal hernia.   DISCHARGE DIAGNOSES:  1. Hypertension.  2. Non-insulin dependent diabetes.  3. Hypercholesterolemia.  4. Hiatal hernia.  5. Degenerative arthritis, right knee, status post right total knee     arthroplasty.  6. Tachycardia.   PROCEDURES:  The patient was taken to the operating room on August 23, 2003, to undergo a right total knee arthroplasty.  Surgeon was Ranee Gosselin, M.D., assistant was Terie Purser, P.A.-C.  Hemovac drain placed at  time of surgery.  Anesthesia was spinal.   CONSULTATIONS:  1. Physical therapy.  2. Occupational therapy.  3. Medicine, Dr. Lendell Caprice.   BRIEF HISTORY:  This patient is a 77 year old male who was admitted for a  right total knee arthroplasty on August 23, 2003.  Had been having  increasing right knee pain for the past several years.  The pain had become  increasingly worse, making it difficult for him to ambulate.  He was  requiring a cane for ambulation.  He had gotten Hyalgan injections in the  past as well as Cortisone.  He has not been able to take non-steroidal anti-  inflammatory medications due to his allergy to aspirin.  The patient  underwent a left total knee arthroplasty in 2000.  He has done very well  following that procedure, and elected to proceed with a right total knee  arthroplasty.   LABORATORY DATA:  Preadmission CBC:  WBC 4.5, RBC 3.93, hemoglobin 12.2,  hematocrit 36.1, platelet count 187.  Preadmission chemistries:  Sodium 141,  potassium 4.6, chloride 107, CO2 29, glucose 123 slightly elevated, BUN 19,  creatinine 1, calcium 9.6, albumin 3.4 slightly low.  Preadmission PT 13.8,  INR 1.1, PTT 44 slightly elevated.  Preadmission urinalysis was negative.  The patient's blood type is O negative, negative antibody screen.  Preadmission x-ray of right knee revealed osteoarthritis of right knee,  small joint effusion.  Preadmission chest x-ray showed no active disease.  Postoperative x-ray of the right knee revealed satisfactory positioning of  total right knee prosthesis.  Preadmission EKG revealed normal sinus rhythm  at a rate of 67.  The patient's hemoglobin and hematocrit were followed  throughout his hospitalization.  He did have a postoperative drop in his  hemoglobin on postoperative day #2, to 8.8, he received blood transfusion 2  units of packed red blood cells  on postoperative day #4.  The patient's  hemoglobin stabilized after the blood transfusion.   HOSPITAL COURSE:  The patient was admitted to [facility], taken to  the operating room for the above stated procedure.  He tolerated the  procedure well, was allowed to return to the recovery room and then to the  orthopaedic floor to continue his postoperative care.  A Hemovac drain was  placed at the time of surgery.  It was discontinued on postoperative day #1.  The patient's hemoglobin and hematocrit were followed throughout his  hospitalization.  He did have a postoperative drop in his hemoglobin on  postoperative day  #3, and he was transfused 2 units of packed red blood cells, stabilizing his  hemoglobin.  On postoperative day #3, the patient was having problems  maintaining his oxygen saturation, and it was documented that his pulse rate  was 177.  Medicine consult was called.  The patient was in SVT.  He was  converted to normal sinus rhythm with 6 mg of Adenosine.   The patient was  transferred to the ICU following this episode.  The patient was followed by  medicine in the ICU and stabilized and transferred back to telemetry.  The  patient has done well, and is progressing well.  He is able to ambulate down  the hall.  It is felt that as soon as the patient is medically stable he can  be transferred to [facility] to continue his rehabilitation.   DISCHARGE MEDICATIONS:  1. Colace 100 mg one p.o. b.i.d.  2. Trinsicon one p.o. t.i.d.  3. Coumadin per pharmacy protocol.  4. Zosyn 2 mg b.i.d.  5. Avandia 4 mg b.i.d.  6. Lopid 600 mg b.i.d.  7. Lipitor 40 mg daily.  8. Glucotrol 10 mg b.i.d.  9. Pepcid 20 mg b.i.d.  10.      Diltiazem 240 mg daily.  11.      Robaxin 500 mg one p.o. q.6h. p.r.n. muscle spasm.  12.      Mepergan Fortis one to two q.4h. p.r.n. pain.  13.      Insulin Novolog to scale t.i.d. a.c.  CBG's 60-100= 0 units, CBG     101-150= 2 units, CBG 151-200= 3 units, CBG 201-250= 6 units, CBG 251-     300= 9 units, CBG 301-350= 12 units.  14.      Insulin Novolog q.h.s.  CBG 251-300= 3 units, 301-350= 4 units,     greater than 350= 5 units.   WOUND CARE:  The patient is to keep incision dry and clean.  Staples need to  be removed on postoperative day #14.   ACTIVITY:  The patient can ambulate full weightbearing with walker.   FOLLOWUP:  The patient is scheduled to follow up with Dr. Darrelyn Hillock two weeks  from the date of surgery.     Ebbie Ridge. Paitsel, P.A.                     Ronald A. Darrelyn Hillock, M.D.    Tilden Dome  D:  08/30/2003  T:  08/30/2003  Job:  [id number]

## 2010-12-14 NOTE — Discharge Summary (Signed)
Omar Chan, Omar Chan                          ACCOUNT NO.:  [id number]   MEDICAL RECORD NO.:  [id number]                   PATIENT TYPE:  INP   LOCATION:  0451                                 FACILITY:  [facility]   PHYSICIAN:  Omar Quarry, MD                   DATE OF BIRTH:  06-12-1926   DATE OF ADMISSION:  11/25/2003  DATE OF DISCHARGE:  11/27/2003                                 DISCHARGE SUMMARY   HISTORY OF PRESENT ILLNESS:  Omar Chan is a 77 year old man who presented  to [facility] on November 25, 2003, with increasingly severe  low back pain over a two week period.  He stated that the pain began when he  bent over to unload dishes from a dishwasher.  He reported that the pain  seemed to be getting steadily worse in spite of taking oral oxycodone and  Soma compound.  His wife was having to take care of him at home which was  quite frustrating to him.   PAST MEDICAL HISTORY:  1. Generalized osteoarthritis.  2. Status post bilateral total knee replacements.   ADMISSION PHYSICAL EXAMINATION:  As described by Omar Chan:  VITAL SIGNS:  Blood pressure is 122/70, pulse is 83, respirations are 18, O2  saturation is 96%.  HEENT:  Within normal limits.  CHEST:  Clear to auscultation and percussion.  CARDIOVASCULAR:  Normal S1 and S2.  There were no murmurs, rubs, or gallops.  ABDOMEN:  Benign.  There were normal bowel sounds without masses or  tenderness.  There was no guarding or rebound.  NEUROLOGIC:  Remarkable for diffuse tenderness about the L5 area.  There was  a positive straight leg raise.  The patient had normal dorsiflexion and  plantar flexion of the feet.  Reflexes were symmetrical.   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  However, the  radiologist indicated that there were no signs of any nerve root impingement  and no signs of spinal stenosis.  Relevant  laboratory studies obtained  included a CBC which revealed a hemoglobin of 12.9, white count of 6000.  Sodium was 139, potassium 4.4, chloride was 109, CO2 23, creatinine of 0.9,  BUN 21.  Urinalysis was normal.   HOSPITAL COURSE:  On admission, the patient's status was discussed with  orthopaedics and a decision was made to try treating him with a Medrol Dose  Pak.  He was also prescribed Percocet one or two q.4h. p.r.n. pain.  His  usual medications were continued.  On November 26, 2003, I added a Duragesic  patch to be applied every three days.  The patient showed rather significant  improvement.  He said that his back pain was substantially better each day.  He was able to sit up and  walk around the room, although he continued to  experience some discomfort when he did this.  By Nov 27, 2003, the patient  indicated that he would prefer to be at home, and this seemed reasonable as  his condition had improved.   DISCHARGE DIAGNOSES:  1. Severe low back discomfort, possibly secondary to diffuse degenerative     joint disease.  2. Hypertension.  3. Diabetes.  4. Generalized osteoarthritis.  5. Hyperlipidemia.   DISCHARGE MEDICATIONS:  The patient will be advised to continue his usual  medications which consist of:  1. Prazosin 2 mg b.i.d.  2. Glipizide 10 mg daily.  3. Avandia 4 mg b.i.d.  4. Lisinopril 10 mg daily.  5. Lipitor 40 mg daily.  6. Gemfibrozil 600 mg b.i.d.   ADDITIONAL DISCHARGE MEDICATIONS:  1. Percocet one or two q.4h. p.r.n. pain.  2. We will continue a tapering schedule of prednisone.  We will have him     take 30 mg daily x2 days, 20 mg daily x2 days, 10 mg daily x2 days, and     then stop.   FOLLOWUP:  He will contact Dr. Recardo Chan office on Monday, and be seen back  in the office later that week.   I did not make an appointment with neurosurgery or orthopaedics because  first of all the patient's x-rays did not suggest that this was a problem  that would  be easily remedied by a surgical procedure, and secondly the  patient appropriately did not really want to consider the possibility of  surgery.   CONDITION ON DISCHARGE:  Fair.                                               Omar Quarry, MD    SY/MEDQ  D:  11/27/2003  T:  11/27/2003  Job:  [id number]   cc:   Omar Chan. Omar Chan, M.D.  [address]  [place]  [place] [address]  Fax: [phone]

## 2010-12-14 NOTE — Op Note (Signed)
Omar Chan, Omar Chan                          ACCOUNT NO.:  [id number]   MEDICAL RECORD NO.:  [id number]                   PATIENT TYPE:  AMB   LOCATION:  ENDO                                 FACILITY:  [facility]   PHYSICIAN:  John C. Madilyn Fireman, M.D.                 DATE OF BIRTH:  Dec 02, 1925   DATE OF PROCEDURE:  05/27/2003  DATE OF DISCHARGE:                                 OPERATIVE REPORT   PROCEDURE:  Esophagogastroduodenoscopy with biopsy.   INDICATIONS:  Anemia with documented significant recent drop in hemoglobin  and no specific findings on colonoscopy.   DESCRIPTION OF PROCEDURE:  The patient was placed in the left lateral  decubitus position and placed on the pulse monitor with continuous low-flow  oxygen delivered by nasal cannula.  He was sedated with 1 mg IV Versed and  12.5 mg IV fentanyl  in addition to the medicine given for the previous  colonoscopy.  The video endoscope was advanced under direct vision into the  oropharynx and esophagus.  The esophagus was straight and of normal caliber,  the squamocolumnar line at 38 cm, above a 4 cm fixed hiatal hernia.  There  was no visible esophagitis, ring, stricture, or other abnormalities at the  GE junction.  The stomach was entered and a small amount of liquid  secretions were suctioned from the fundus.  Retroflexed view of the cardia  confirmed a large hiatal hernia and was otherwise unremarkable.  No erosions  were noted within the hernia sac nor the endothoracic, fundus, body, antrum  and pylorus which were was otherwise completely normal as well.  The pylorus  was nondeformed and easily allowed passage of the endoscope to the tip of  the duodenum.  Both bulb and second portion were  inspected and appeared to  be within normal limits.  The scope was then advanced as far as possible  down distal duodenum and biopsies were taken to rule out celiac disease.  The scope was then withdrawn and the patient returned to the recovery  room  in stable condition.  He tolerated the procedure well and there were no  immediate complications.   IMPRESSION:  A 4 cm hiatal hernia; otherwise normal study.   PLAN:  Await biopsy results and will obtained Hemoccults to insure he is not  having any ongoing occult GI blood loss if this has not been done recently.                                               John C. Madilyn Fireman, M.D.    JCH/MEDQ  D:  05/27/2003  T:  05/27/2003  Job:  [id number]   cc:   Chales Salmon. Abigail Miyamoto, M.D.  [address]  [place]  [place] [address]  Fax: [phone]

## 2010-12-14 NOTE — Op Note (Signed)
   [name], Omar Chan                          ACCOUNT NO.:  [id number]   MEDICAL RECORD NO.:  [id number]                   PATIENT TYPE:  AMB   LOCATION:  ENDO                                 FACILITY:  [facility]   PHYSICIAN:  John C. Madilyn Fireman, M.D.                 DATE OF BIRTH:  [birth year]   DATE OF PROCEDURE:  05/27/2003  DATE OF DISCHARGE:                                 OPERATIVE REPORT   ADDENDUM:   PROCEDURE:  Colonoscopy.   I failed to mention in the body of the report that there was a large,  somewhat pale appearing AVM in the base of the cecum.  I elected not to  fulgurate it at this time due to no evidence of previous gross hematochezia,  melena or heme positive stools.                                               John C. Madilyn Fireman, M.D.    JCH/MEDQ  D:  05/27/2003  T:  05/27/2003  Job:  [id number]

## 2010-12-14 NOTE — Op Note (Signed)
NAMEJAIRON, RIPBERGER                ACCOUNT NO.:  [id number]   MEDICAL RECORD NO.:  [id number]          PATIENT TYPE:  INP   LOCATION:  1510                         FACILITY:  [facility]   PHYSICIAN:  Madlyn Frankel. Charlann Boxer, M.D.  DATE OF BIRTH:  1926-01-07   DATE OF PROCEDURE:  03/26/2006  DATE OF DISCHARGE:                                 OPERATIVE REPORT   PREOPERATIVE DIAGNOSIS:  Right knee pain and swelling, status post revision  total knee replacement four weeks ago, rule out infection.   POSTOPERATIVE DIAGNOSIS/FINDINGS:  Right knee postoperative swelling and  pain, no obvious clinical signs of infection.   PROCEDURE:  Right knee open incision and drainage, excision of synovium with  cultures sent from fluid and tissue.   SURGEON:  Durene Romans, M.D.   ASSISTANT:  Surgical tech.   ANESTHESIA:  General.   ESTIMATED BLOOD LOSS:  Minimal.   TOURNIQUET TIME:  70 minutes at 300 mmHg.   DRAINS:  Drains times one.   COMPLICATIONS:  None.   INDICATIONS FOR PROCEDURE:  Mr. Cueva is a pleasant 77 year old gentleman who  is now four weeks out from a revision right total knee.  He had presented to  the office about a week and a half ago with painful swelling on the knee. He  reported at the time, no fevers, chills or any other constitutional  symptoms.  An aspiration of the knee at the time revealed 2000 white cells  and no growth at three days.  This was called and reported to him and he  reported persistent swelling and pain. The 2000 white cells is obviously not  a very significant number with regards to native knees but in the setting of  the knee replacements, is somewhat of concern with threshold of around 2500  cells being reported.  I reviewed all these findings with him and felt that  the safest way to treat this, was to take him to the OR, I and D the knee,  excise the synovium and take new cultures.  I discussed over the phone,  risks and benefits, consent was obtained for the  surgical procedure.   PROCEDURE IN DETAIL:  The patient was brought to the operative theater. Once  adequate anesthesia was established, the patient was positioned supine on  the operating table with the proximal thigh a tourniquet placed. The leg was  exsanguinated and tourniquet elevated. Midline incision was incised, sharp  dissection was carried down to the extensor mechanism. A medial arthrotomy  was carried out.  Appeared to have more of a sanguineous hemarthrosis, post  anything that looked purulent.  Cultures were obtained. Knee exposure was  obtained including synovectomy and debridement.  I did send off  suprapatellar fat pad and synovium for STAT gram stain and culture.  Following the debridement, I did receive notes from the gram stain and  culture that were negative growth with rare white blood cells.  I irrigated  the knee with pulse lavage six liters normal saline solution, followed by  500 cc of Bacitracin fluid.  Following the irrigation and debridement, the  extensor mechanism was reapproximated over a suction drain with #1 PDS.  I  reapproximated the remainder of the wound with 2-0 Vicryl and 3-0 nylon on  the skin. The patient was extubated and brought to the recovery room in  stable condition.      [name], M.D.  Electronically Signed     MDO/MEDQ  D:  03/26/2006  T:  03/27/2006  Job:  [id number]

## 2010-12-14 NOTE — Discharge Summary (Signed)
NAMETREVONN, Omar Chan                ACCOUNT NO.:  [id number]   MEDICAL RECORD NO.:  [id number]          PATIENT TYPE:  INP   LOCATION:  1617                         FACILITY:  [facility]   PHYSICIAN:  Madlyn Frankel. Charlann Boxer, M.D.  DATE OF BIRTH:  30-Jun-1926   DATE OF ADMISSION:  03/26/2006  DATE OF DISCHARGE:  03/30/2006                                 DISCHARGE SUMMARY   ADMITTING DIAGNOSES:  1. Right knee pain and swelling status post revision total knee      replacement arthroplasty 4 weeks ago, rule out infection.  2. Hypertension.  3. Diabetes.  4. Benign prostatic hypertrophy.   DISCHARGE DIAGNOSES:  1. Right knee pain and swelling status post revision total knee      replacement arthroplasty 4 weeks ago, rule out infection.  2. Hypertension.  3. Diabetes.  4. Benign prostatic hypertrophy.  5. Severe erosive esophagitis.  6. Medium sized hiatal hernia.   OPERATION:  On [id number], the patient underwent right knee open incision  and drainage, excision of synovial with cultures sent from fluid and tissue.   CONSULTS:  Dr. Bosie Clos of gastroenterology.   PROCEDURE:  Endoscopy for hematemesis performed by Dr. Bosie Clos.   BRIEF HISTORY:  This 77 year old white male who underwent a revision of  total knee replacement arthroplasty of the right knee some 4 weeks ago was  seen in our office and had aspiration of the knee.  It was red, erythematous  and had an effusion.  The aspirate showed of 2000 white cells but did not  show any growth after 3 days. Since this was suspicious for a knee  infection, it was decided an I&D would be indicated and the patient was  admitted for same.   COURSE IN THE HOSPITAL:  The patient tolerated the surgical procedure quite  well, placed again on anticoagulation in the form of a Lovenox.  The patient  had some dyspepsia and began vomiting up dark vomitus. This was checked and  found to be positive for occult blood. We asked for a GI consult. Dr.  Bosie Clos  and his PA saw the patient.  It was found out that the patient had  severe erosive esophagitis and a medium-size hernia.  Medications and diet  was adjusted (please see below).  Once he was cleared and treated as far as  GI was concerned, it was decided he could be maintained in his home  environment.  Arrangements were made for discharge.  On the day of  discharge, the wound was clean and dry.  Neurovascular was intact at the  operative extremity.  Laboratory values in the hospital hematologically showed a preoperative CBC  within normal limits.  Final hemoglobin was 10.0 with hematocrit of 29.5.  Final white count was 10.2.  Blood chemistries remained normal other than an  elevated glucose. C-reactive protein was high at 0.9.  Urinalysis negative  for a urinary tract infection. Gram's stain showed rare WBCs, no organisms.  The culture showed no growth in 3 days.   CONDITION ON DISCHARGE:  Improved, stable.   PLAN:  The patient is discharged  to his home. He is to continue with his  Lovenox.  He is given mild analgesics for discomfort.  No IV antibiotics  were indicated at this time.  Dr. Bosie Clos has placed him on Carafate slurry  clear liquid diet to advance to previous diet and will follow-up in their  office.  Dr. Charlann Boxer will see him in his office 10 days after the date of  surgery.      [name].      [name], M.D.  Electronically Signed    DLU/MEDQ  D:  04/08/2006  T:  04/09/2006  Job:  [id number]   cc:   Omar Chan. Omar Chan, M.D.  Fax: [phone]

## 2010-12-14 NOTE — H&P (Signed)
Omar Chan, Omar Chan                ACCOUNT NO.:  [id number]   MEDICAL RECORD NO.:  [id number]           PATIENT TYPE:   LOCATION:                                 FACILITY:   PHYSICIAN:  Omar Frankel. Charlann Chan, M.D.  DATE OF BIRTH:  June 23, 1926   DATE OF ADMISSION:  02/11/2006  DATE OF DISCHARGE:                                HISTORY & PHYSICAL   CHIEF COMPLAINT:  Pain in my right knee.   HISTORY OF PRESENT ILLNESS:  This 77 year old white male seen by [place] for  continuing and progressive problems concerning pain into his right knee. The  patient underwent a right total knee replacement arthroplasty approximately  2 to 2-1/2 years ago utilizing the Jackson systems. This was performed by  Dr. Darrelyn Chan. Unfortunately he has had increasing pain into the right knee,  difficulty getting about and having swelling into the knee. He has had an  extensive workup with aspiration for infection, a low white blood count of  about 70. All the cultures were negative from the right knee. Bone scan  showed some uptake in the region of the fusion seen in the right knee. This  was thought to be in the presence of an inflammatory process. He tried  antiinflammatories and really has not helped his overall situation.   This is a very active gentleman, retired Cabin crew, attends many reunions and  finds that his right knee is interfering markedly with his day to day  activities. He now has to use a cane exclusive for ambulation. After much  discussion including the risks and benefits of surgery, it was felt he would  benefit with surgical intervention and is being admitted for revision of  right total knee replacement arthroplasty. Cultures will be taken at the  time of surgery.   The patient has been cleared preoperatively by Dr. Henrine Chan and by his  cardiologist, Dr. Cassell Chan.   PAST MEDICAL HISTORY:  This patient has been in relatively good health. He  is being treated for hypertension and type 2  diabetes.   CURRENT MEDICATIONS:  1.  Gemfibrozil 600 mg b.i.d.  2.  Avandia 4 mg 1 b.i.d.  3.  Lipitor 80 mg daily.  4.  Glipizide 10 mg daily.  5.  Prazosin 2 mg b.i.d.  6.  Lisinopril 20 mg daily.   ALLERGIES:  He is allergic to ASPIRIN which causes whelps.   Again, his cardiologist is Dr. Ronny Chan and his family physician is Dr.  Henrine Chan.   PAST SURGICAL HISTORY:  Left total knee replacement arthroplasty by Dr.  Jerl Chan in the year 2000 and right total knee replacement arthroplasty by  Dr. Darrelyn Chan in January 2005.   FAMILY HISTORY:  Noncontributory.   SOCIAL HISTORY:  The patient is married, retired, no intake of tobacco or  tobacco products, has 4 children, and spouse will be caregiver after  surgery. Perhaps will be able just to go home with home health.   REVIEW OF SYSTEMS:  CNS:  No seizure disorder, paralysis, numbness or double  vision. RESPIRATORY:  No productive cough, no  hemoptysis, no shortness of  breath. CARDIOVASCULAR:  No chest pain, no angina, no orthopnea.  GASTROINTESTINAL:  No nausea, vomiting, melena, or bloody stools.  GENITOURINARY:  No discharge, dysuria or hematuria.  MUSCULOSKELETAL:  Primarily in present illness.   PHYSICAL EXAMINATION:  GENERAL:  Alert, cooperative, friendly, obese, 86-  year-old white male whose accompanied by his wife, Omar Chan.  VITAL SIGNS:  Blood pressure 142/70, pulse 80, respirations 12.  HEENT:  Normocephalic. PERRLA. Wears glasses. Oropharynx is clear.  CHEST:  Clear to auscultation, no rhonchi, no rales, no wheezes.  HEART:  Regular rate and rhythm. Grade 3.6 holosystolic murmur heard best at  the right sternal border.  ABDOMEN:  Soft, nontender, obese. Liver and spleen not felt.  GENITALIA/RECTAL:  Not done not pertinent to present illness.  EXTREMITIES:  The patient has painful range of motion of the right knee. He  has at least 1+ swelling compared to the left. He has 1+ pitting edema  bilaterally and  equal.   ADMISSION DIAGNOSES:  1.  Painful right total knee replacement arthroplasty.  2.  Hypertension.  3.  Diabetes type 2.   PLAN:  The patient will undergo revision of the right total knee replacement  arthroplasty. Should we have any medical problems, we will certainly call  Dr. Abigail Chan or one of his associates and if we have any cardiac problems, we  will call Dr. Patty Chan. The patient mentioned that in his last  hospitalization in 2005 that he was in intensive care because he had a  spell after his anesthesia. I asked him to discuss with the  anesthesiologist the possibility of a spinal anesthesia for this procedure.      Omar Chan.      Omar Chan, M.D.  Electronically Signed    DLU/MEDQ  D:  01/28/2006  T:  01/28/2006  Job:  [id number]   cc:   Omar Chan. Omar Chan, M.D.  Fax: [phone]   Omar Chan, M.D.  Fax: [phone]

## 2010-12-14 NOTE — Consult Note (Signed)
Omar Chan, Omar Chan                ACCOUNT NO.:  [id number]   MEDICAL RECORD NO.:  [id number]          PATIENT TYPE:  INP   LOCATION:  1617                         FACILITY[facility]  [facility]   PHYSICIAN:  Shirley Friar, MDDATE OF BIRTH:  [birth year]   DATE OF CONSULTATION:  DATE OF DISCHARGE:                                   CONSULTATION   REASON FOR CONSULTATION:  We were asked to do a GI consult on Omar Chan for  hematemesis.   CHIEF COMPLAINT:  Hematemesis, status post total knee replacement.   HISTORY OF PRESENT ILLNESS:  The patient reports having knee surgery on  March 26, 2006 to clean out an infection and replace a knee replacement. He  has had this right knee replaced three times now, August 23, 2003, February 11, 2006, and March 26, 2006. He started vomiting several hours post surgery on  the 29th, after he had eaten a grilled cheese sandwich and [place] fries. The  vomiting continued throughout the day on Thursday, the 30th, and ceased  Thursday evening. As he vomited, it became progressively darker. The nurse  guaiaced a sample and found it heme positive. Interestingly enough, the  patient reports that a similar episode happened in October 2004 when he was  having knee surgery and suffered coffee ground emesis. In 2004, he was  scoped by Dr. Dorena Cookey. His endoscopy was normal with a 4 cm hiatal  hernia. His colonoscopy showed a pale AVM in the base of the cecum and  diverticulosis. The reason listed on the colon and endoscopy report for  doing these procedures, was chronic anemia. At that time, his hemoglobin was  10.8. Also of note, was that in the year 2000, Omar Chan had a lower  esophageal stricture and erosive esophagitis. Omar Chan denies NSAID'S and  alcohol. He reports no headache, weakness, dizziness, dyschesia, reflux,  abdominal pain. No nausea, vomiting, or dizziness. No melena, hematochezia.  He is positive for weight loss of 13 pounds in the last 4 to 6 weeks and  he  reports he has no taste for food since the knee surgery that he had in July.  He is also having costochondral pain from vomiting all day yesterday.   PAST MEDICAL HISTORY:  Significant for hiatal hernia, anemia, hypertension,  diabetes mellitus type 2, hypercholesterolemia, history of bilateral knee  replacements, and osteoarthritis.   CURRENT MEDICATIONS:  Include iron, Lopid, Lipitor, Prazosin, Glipizide,  Lisinopril, Tylenol, NovoLog, Pepcid, hydrocodone, Reglan, Benadryl,  Restoril, and Robaxin.   ALLERGIES:  ASPIRIN (swelling and a rash when he takes aspirin.)   SOCIAL HISTORY:  He is married and retired. He is an ex-smoker and an ex-  alcohol consumer.   FAMILY HISTORY:  Negative for any GI malignancy.   REVIEW OF SYSTEMS:  Negative for fever, chills, sore throat, headache,  cough.   PHYSICAL EXAMINATION:  VITAL SIGNS:  Temperature 98.4, pulse 86, respiratory  rate 20, blood pressure 144/64. Is O2 sat is 98% on 2 liters.  GENERAL:  He is alert and oriented. No acute distress.  HEENT:  He has  a good deal of periorbital edema.  NECK:  Supple with no adenopathy, no thyromegaly.  LUNGS:  He had some expiratory wheezes on the left anteriorly.  HEART:  Regular rate and rhythm.  ABDOMEN:  Obese, firm, but not tender to palpation. He has no bruits. He had  positive bowel sounds.   LABORATORY DATA:  CBC showed a white count of 10.2. Hemoglobin and  hematocrit 10.0 and 29.5. Platelet count 229,000. Chem 7 showed a sodium of  140, potassium 4.2, chloride 109, bicarb 26, BUN 18, creatinine 0.8. Glucose  144, calcium 8.1. He was gastric occult positive, pH of gastric fluid was 3.   ASSESSMENT/PLAN:  This is a 77 year old obese male with history of multiple  knee replacements, who had an upper gastrointestinal bleed, status post knee  surgery. He has chronic anemia. His hemoglobin is currently 10.0. He is  status post knee surgery on March 26, 2006 that was necessary because of  an  infection in his knee replacement. Dr. Charlott Rakes has seen and  examined the patient and he will perform endoscopy this evening. Will  evaluate with EGD to look for peptic ulcer disease vs. esophagitis (reflux  vs infectious) vs hemorrhagic gastritis.      Stephani Police, [place]      Shirley Friar, MD  Electronically Signed    MLY/MEDQ  D:  03/28/2006  T:  03/29/2006  Job:  [id number]   cc:   [name], M.D.  Fax: [phone]

## 2010-12-14 NOTE — Op Note (Signed)
Omar Chan, Omar Chan                ACCOUNT NO.:  [id number]   MEDICAL RECORD NO.:  [id number]          PATIENT TYPE:  INP   LOCATION:  0008                         FACILITY:  [facility]   PHYSICIAN:  Madlyn Frankel. Charlann Boxer, M.D.  DATE OF BIRTH:  Aug 17, 1925   DATE OF PROCEDURE:  02/11/2006  DATE OF DISCHARGE:                                 OPERATIVE REPORT   PREOPERATIVE DIAGNOSIS:  Failed right total knee replacement with noted  findings, clinically, preoperatively of infection.   POSTOPERATIVE DIAGNOSIS:  Failed right total knee replacement with noted  findings, clinically, preoperatively of infection.   PROCEDURE:  Revision right total knee replacement.   COMPONENTS USED:  A DePuy revision knee system with size #5 Sigma posterior  stabilized stem with a 30-mm cemented stem at 5 degrees valgus with a 4-mm  distal lateral augment.  A size 4 MBT revision tray without augments, and a  17.5-mm polyethylene insert posterior stabilized for the MBTA revision tray  and a 38 patellar button.   SURGEON:  Madlyn Frankel. Charlann Boxer, M.D.   ASSISTANTDruscilla Brownie. Cherlynn June.   ANESTHESIA:  Dermal spinal.   COMPLICATIONS:  None.   DRAINS:  x1.   BLOOD LOSS:  150 mL.   TOURNIQUET TIME:  120 minutes at 300 mm.   COMPLICATIONS:  None.   INDICATION FOR PROCEDURE:  Omar Chan is a pleasant 77 year old gentleman who  is referred for evaluation of his right knee.  He presented with recurring  swelling to the right knee with pain.  This pain had been progressive.  He  had no clinical presentation of infection.  Lab work had been negative  preoperatively.  He had a bone scan which indicated increased uptake around  the proximal tibia, indicating loosening.   I discussed with him the risks and benefits of the proposed procedure, the  concerns for infection. I reviewed the revision, total knee replacement.  The risks of infection, DVT, component failure, persistent discomfort, and  need for revision surgery  postoperatively.  This was all discussed in the  setting of increased complications in revision setting.  Consent was  obtained.   PROCEDURE IN DETAIL:  The patient was brought to the operative theater.  Once adequate anesthesia and 2 grams of Ancef and prophylactic antibiotics  were administered, the patient was positioned supine with the proximal thigh  tourniquet placed.  The right lower extremity was then prepped and draped in  the sterile fashion following a prescrub.  The patient's previous incision  was incised.  Sharp dissection was carried down to identify the extensor  mechanism and plane was created.  The joint was opened, and was there was  noted to be a large hemarthrosis.  No signs of obvious infection. I sent a  tube of joint fluid to the lab for STAT Gram stain culture as well as cell  count.  These did not come back until the end of the case prior to  cementing.  They were noted to have no white blood cells, in the Gram stain;  and no bacteria noted.  Cell count  was noted to be 40.   Following this the exposure of the knee was carried out including large  synovectomy creating the medial lateral gutters.  This allowed for exposure  of the knee.  With exposure of the knee, in routine fashion, without an  adjunctive technique like quad snip or tubercle osteotomy; I then removed  the polyethylene insert.  It was obvious that the tibial component was loose  based on the movement at this time.   Further exposure allowed for delineation of the femoral component which was  noted to be in some extension.  I then used a 1/4-inch osteotome; and was  able to undermine this component; and, with time, was able to remove the  component with some bone loss off the anterior aspect of the femur.  Again,  the previous femoral component was noted to be in some extension which made  it difficult to get underneath it without damaging the femur.   Following removal of the femur.  Retractors  were placed to allow for  exposure of the tibia.  Tibial exposure was obtained with very little  difficulty.  Then using the 1/4-inch osteotome, with several taps, I was  able to remove the tibial component.  It cleared the lateral aspect of the  femur fine.  At this point, attention was directed to removing the cement  off the tibial tray; as it never seemed to have bonded to the cement.   This was done without difficulty and without complication.  At this point, I  placed an extramedullary guide on proximal tibia.  Following this with  removal of cement, I hand-reamed up with the plan to use a 30-mm cemented  stem on the femur.  I hand-reamed up to 17-mm on the femur, then just to  clean out the tibial canal, I reamed up to 14 mm.  Based on the fact that I  was planning on using the MBT revision tray, I just used an extramedullary  guide, cutting some posterior slope.  I based this off the lateral side with  plan to resect 2-mm of bone, based on the fact that the previous component  appeared to be a slight bit of varus.  This cut was made removing 2-mm of  bone off the lateral side and then cleaned up the cup medially.   I then sized the proximal tibia to be a size 4 with good rotation of the  component.  With this the component was positioned and the pins placed, and  the canal reamed to that size.  Based on the nature of the proximal femur,  despite no significant bone loss, the keel punch was not significant to hold  it in place and this was not utilized at this point.   At this point attention was directed to the femur.  The size 15 reamer was  then passed into the mid shaft of the isthmus of the femur and placement of  a 16-mm sleeve was then utilized to centralize the rod into the canal.  I  then placed the distal femoral cutting guide onto the femur noting that the  previous component was in too much valgus.  With the guide in place, I had determined that I needed to use 4-mm  augments distally and laterally.  Freshened-up cuts were made both medially and laterally with minimal cuts  made with cleaning up the surface with a 4-mm augment distally and  laterally.  At this point, I sized the femur  to be a size 5 holding the  component into position which appeared to fit nicely in the AP and medial  and lateral planes.   With the +2 adapter in place, I placed the anterior-and-posterior cutting  guide.  The rotation was confirmed off the cut surface of the tibia.  There  was no cut anteriorly and a minimal cut posteriorly, medially and laterally;  no needs for augments.  After this cut, the chamfer cut guide was placed and  the anterior-and-posterior chamfer cuts as well as box cut was made.  Following the femoral preparation a trial component was made up, placed onto  the distal femur with the tibial tray in place.  I tried initially a 12.5  poly and then went right to 15-mm poly.  This felt good in extension and  with flexion there was a slight bit of opening medially.  At this point I  was utilizing this as my trial.  With these trial components in position  rotation was marked on the tibia.  I then attended to the patella.   Following debridement around the patella.  I measured the patella with the  previous placed button and it was approximately 28 mm.  I resected the bone  and old patella component down to 15 mm and measured it to be a 38-mm  patella from the [employer] DePuy system.  This was held in position and drill  holes made.  Patella was noted to track without application of any pressure.  I did not feel that the joint was overstuffed, based on the position of the  femoral component.  Given this the final components were brought into the  field.   The trial components were removed and the cement restricters were measured  and placed deep with a size 6 in the femur and a size 4 in the tibia.  I  injected the knee with 60 mL of Marcaine with epinephrine.  No  Toradol was  used based on the fact that he had an aspirin allergy.  Once the components  were brought into the field, cement was mixed.  I mixed 3 bags of cement  with 3 grams of vancomycin for prophylaxis.   The tibial component was cemented into position first, marking rotation  previously noted.  I then cemented the femur into position.  A  15-mm spacer was applied, and the knee brought to extension to allow the  cement to cure.  Cement was debrided particularly carefully around the  anterior aspect of the femoral component, based on the extended nature of  the previously placed component; I did not want to remove an excessive  amount.   The patella component was then cemented into position with the clamp placed.  Excessive cement was debrided.  Once this cement had had cured, excessive  cement was debrided off the posterior aspect of the tibia as well as on the  medial and lateral aspect of the femur.  A trial reduction was then carried out and I actually chose a 17.5 poly as I felt that it secured his flexion  gaps better.  The knee came to full extension; and was stable from the  extension to flexion.   At this point a final 17.5 poly was inserted onto the tibial tray and the  knee reduced.   A medium Hemovac drain was placed, at this point, and the knee was  irrigated, again, and any other debris was removed.   With the knee  flexed, the knee extensor mechanism was reapproximated using  #1 Vicryl.  The tourniquet was let down at 2 hours.  The remainder of the  wound was closed in layers using staples on the skin.  Following this the  knee was cleaned, dried, and dressed sterilely with a sterile bulky Jones  dressing.  He was then brought to the recovery room in stable condition.      [name], M.D.  Electronically Signed     MDO/MEDQ  D:  02/11/2006  T:  02/11/2006  Job:  [id number]

## 2010-12-14 NOTE — Op Note (Signed)
NAMEFRAN, Omar Chan                          ACCOUNT NO.:  [id number]   MEDICAL RECORD NO.:  [id number]                   PATIENT TYPE:  INP   LOCATION:  X006                                 FACILITY:  [facility]   PHYSICIAN:  Georges Lynch. Darrelyn Chan, M.D.             DATE OF BIRTH:  1925/08/15   DATE OF PROCEDURE:  08/23/2003  DATE OF DISCHARGE:                                 OPERATIVE REPORT   SURGEON:  Georges Lynch. Darrelyn Chan, M.D.   ASSISTANT:  Ebbie Ridge. Paitsel, P.A.   PREOPERATIVE DIAGNOSES:  Severe degenerative arthritis with a varus  deformity of the right knee.   POSTOPERATIVE DIAGNOSES:  Severe degenerative arthritis with a varus  deformity of the right knee.   OPERATION:  Right total knee arthroplasty.  All three components were  cemented and 1 g of vancomycin was used in the cement.  The sizes used was a  size 11 right posterior cruciate stabilized type femoral component, the  tibial tray was a size 9 tibial tray, the insert was a size 9 flexed tibial  insert. The patella was a size 26 mm recessed patella.   DESCRIPTION OF PROCEDURE:  Under spinal anesthesia, routine orthopedic prep  and draping of the right lower extremity was carried out. The patient had 1  g of IV Ancef.  At this time after the sterile prep and draping was carried  out, the leg was exsanguinated with an esmarch and the tourniquet was  elevated to 375 mmHg.  Anterior approach to the knee was carried out,  bleeders identified and cauterized.  Following this, two flaps were created  and a median parapatellar approach was carried out, patella was reflected  laterally, knee was flexed and I did excised the anterior posterior cruciate  ligaments and also the medial and lateral menisci.  At this time, initial  drill hole was made in the intercondylar notch.  A #1 jig was inserted and  10 mm thickness off the distal femur was removed. At this time, a #2 jig was  inserted for a size 11 right posterior cruciate's  stabilized femur and I  carried out my anterior, posterior and chamfering cuts. Following this, a  tibial cut was made, I removed 4 mm thickness of the affected medial side of  the tibia and we did utilize the intramedullary guides. After this, we then  made our notch cut out of the femur and our patellar notch cut in the usual  fashion. We then went through the trials and felt that the 10 mm thickness  trial insert was the most stable. We removed all the trials and prior to  doing that, I cut the 10 mm thickness off the articular surface of the  patella for a recessed patella and three drill holes were made and this was  for a size 26 patella.  Following this, I then flexed the knee after  removing all the  trial components and cut my keel cut out of the proximal  tibial metaphysis. I thoroughly water picked out the knee, dried the knee  out, cemented all three components in simultaneously and 1 g of vancomycin  was used in the cement.  At this particular time, I then removed all loose  pieces of cement and we went through the trials again and it was felt that  the size 10 mm thickness insert was the most stable.  After removing the  trial insert, we then flexed the knee and made sure there were no other  loose pieces of cement present. We thoroughly irrigated out the knee, dried  the knee out and inserted our permanent size 9 tibial tray flex insert.  The  knee then was reduced, taken through motion and we had excellent stability.  I did a partial lateral release, I inserted a Hemovac drain and closed the  wound in layers in the usual fashion.  Sterile Neosporin dressings were  applied.                                               Omar Chan, M.D.   RAG/MEDQ  D:  08/23/2003  T:  08/23/2003  Job:  [id number]

## 2010-12-14 NOTE — Discharge Summary (Signed)
NAMELYELL, CLUGSTON                ACCOUNT NO.:  [id number]   MEDICAL RECORD NO.:  [id number]          PATIENT TYPE:  INP   LOCATION:  1516                         FACILITY:  [facility]   PHYSICIAN:  Madlyn Frankel. Charlann Boxer, M.D.  DATE OF BIRTH:  Jul 31, 1925   DATE OF ADMISSION:  02/11/2006  DATE OF DISCHARGE:  02/14/2006                                 DISCHARGE SUMMARY   ADMITTING DIAGNOSES:  1.  Painful right total knee replacement arthroplasty.  2.  Hypertension afebrile.  3.  Diabetes type 2.   DISCHARGE DIAGNOSIS:  1.  Painful right total knee replacement arthroplasty.  2.  Hypertension afebrile.  3.  Diabetes type 2.  4.  Mild postoperative hemorrhagic anemia.   OPERATIONS:  On February 11, 2006, the patient underwent revision right total  knee replacement arthroplasty for failed right total knee replacement.  Jenne Campus, P.A.-C., assisted.   BRIEF HISTORY:  This 77 year old oral male was seen by [place] for problems  concerning his right knee.  He had recurrent swelling of the right knee, and  he had a level of pain that was markedly increasing.  He had moderate amount  of swelling.  The preoperative workup showed no evidence of any infection.  Bone scan showed loosening of the components.  This knee was done at another  facility.  After risks and benefits of surgery were described to the  patient, it was felt he would benefit from surgical intervention, and the  above procedure was scheduled.   COURSE IN THE HOSPITAL:  The patient tolerated surgical procedure quite  well, was very pleased with the relief in the discomfort he had  preoperatively.  He understood that he would have pain on the anterior knee  when incision was done, but overall he felt more stability of the knee.  The  patient did progress so well with physical therapy.  He was familiar with  the protocol as he had had this knee only 2 to 2-1/2 years ago.  That was  performed with Dr. Darrelyn Hillock.  Wound remained clean and  dry with no evidence  of infection.  Neurovascular remained intact in the operative extremity.  The patient had a hypoglycemic event but was totally asymptomatic.  This  occurred several times.  We certainly watched his glucose.  He did not have  a bowel movement but had had positive flatus.   It was felt that the hypoglycemic event was the fact that he did have some  diabetes and was placed on a insulin protocol.  This was, of course,  discontinued.  He mentioned that he had had a similar situation before his  previous surgery.  We notified Dr. Abigail Miyamoto of this and recommended follow-up  and would take the glipizide only and would follow-up with Dr. Abigail Miyamoto.   The patient was in stable condition at the time of discharge.   LABORATORY VALUES:  In the hospital hematologically showed his preoperative  CBC with a mild anemia.  RBC was 3.87, hemoglobin was 12.0, hematocrit 36.0.  White count was normal.  Final hemoglobin was 8.4 with  hematocrit of 25.0.  His blood chemistries were essentially negative.  His preoperative glucose  was 140, and due to insensitivity to insulin, it dropped to 47 as mentioned  above.  Urinalysis was negative urinary tract infection.  Cultures done at  surgery showed no growth in 3 days and no organisms by Gram stain.  No  anaerobes were isolated as well.  Electrocardiogram showed marked sinus  bradycardia at 49 beats per minute.  The chest x-ray showed mild  cardiomegaly with suspicion for COPD, pulmonary interstitial disease.   CONDITION ON DISCHARGE:  Improved, stable.  The patient is discharged to his  home.  Continue weightbearing as tolerated.  Return to see Dr. Charlann Boxer 2 weeks  after date of surgery.  Use dry dressing p.r.n. to the knee.  Per Dr.  Recardo Evangelist orders, he is take the glipizide only for his type 2 diabetes.  He  will see Dr. Abigail Miyamoto next week.  He is given on Coumadin, to continue with  Coumadin protocol for 4 weeks after date of surgery, and be  managed by  pharmacy.  Robaxin 500 mg for muscle relaxant, oxycodone for breakthrough  pain, Tylenol 2 q.6h. and Trinsicon as an iron supplement 1 b.i.d.  He is  encouraged to call should he have problems or questions.  He is to laxative  of choice at home.      [name].      [name], M.D.  Electronically Signed    DLU/MEDQ  D:  02/28/2006  T:  03/01/2006  Job:  [id number]   cc:   Chales Salmon. Abigail Miyamoto, M.D.  Fax: [phone]   Cassell Clement, M.D.  Fax: [phone]

## 2010-12-14 NOTE — H&P (Signed)
[name], [name]                ACCOUNT NO.:  [id number]   MEDICAL RECORD NO.:  [id number]          PATIENT TYPE:  INP   LOCATION:  1617                         FACILITY:  [facility]   PHYSICIAN:  Omar Chan. Omar Chan, M.D.  DATE OF BIRTH:  02/11/1926   DATE OF ADMISSION:  03/26/2006  DATE OF DISCHARGE:  03/30/2006                                HISTORY & PHYSICAL   This history and physical is dictated from the hospital record.   CHIEF COMPLAINT:  Problems with my right knee.   PRESENT ILLNESS:  This 77 year old gentleman had a revision of his right  total knee 4 weeks ago.  He presented to the office about a week ago with  painful swelling into the knee.  He had no fevers, chills, or other  constitutional symptoms.  Aspiration the knee, done in the office, showed  2,000 white cells and no growth on cultures at 3 days.  He had persistent  swelling and pain which gave [place] some concern as the threshold of white cell  count being 2,500.  Dr. Charlann Chan discussed this all with the patient, felt  safest way was to take him to the operating room, perform an incision and  drainage, excise the synovium and take new cultures.  Risks and benefits  were described to the patient at that time.   PAST MEDICAL HISTORY:  The patient's family physician is Dr. Abigail Chan.   SURGICALLY:  1. He had a right total knee revision done, October 12, 2005.  2. Right total knee replacement was done by Dr. Darrelyn Chan in January 2005.  3. Left total knee replacement arthroplasty in 2000.   The patient has:  1. Hypertension.  2. Heart murmur and currently is on prophylactic antibiotics.  3. He has diabetes.  4. He has been diagnosed with BPH, but denies any voiding problems.  5. He has generalized osteoarthritis with disk herniation, nerve      impingement in the back which transient as his symptomatology.  6. He has had a stress test in Chan 2007, which was normal with ejection      fraction of 61%, overseen by Dr. Cassell Chan.   ALLERGIES:  ASPIRIN WHICH CAUSES HIVES.   CURRENT MEDICATIONS:  1. Gemfibrozil 600 mg p.o. b.i.d.  2. Lipitor 80 mg p.o. q.a.m.  3. Prazosin 2 mg p.o. b.i.d.  4. Glipizide 10 mg p.o. q.a.m.  5. Lisinopril 20 mg p.o. q.a.m.  6. Tylenol for discomfort.   REVIEW OF SYSTEMS:  CNS:  No seizure disorder, paralysis, numbness, or  double vision.  RESPIRATORY:  The patient is a heavy breather.  He has  some shortness of breath with extreme exertion.  No hemoptysis.  GASTROINTESTINAL:  No nausea, vomiting, melena, bloody stools.  CARDIOVASCULAR:  No chest pain, no angina, no orthopnea.  GENITOURINARY:  No  discharge, dysuria, hematuria.  MUSCULOSKELETAL:  Primarily in present  illness.   SOCIAL HISTORY:  Can be seen in previous dictations and admissions.   FAMILY HISTORY:  Can be seen in previous dictations and admissions.   PHYSICAL EXAMINATION:  GENERAL:  Alert,  and cooperative, 77 year old white  male who is quite obese.  VITAL SIGNS:  Blood pressure  150/84, temperature 96.8, pulse 86,  respirations are 20.  HEENT:  Normocephalic.  PERLA.  Oropharynx is clear.  CHEST:  Clear to auscultation.  HEART:  Regular rate and rhythm.  Heart sounds are quite distant.  ABDOMEN:  Obese, soft.  Liver spleen not felt.  GENITALIA/RECTAL:  Not done.  Not pertinent to present illness.  EXTREMITIES:  Right knee with tenderness to palpation.  It is somewhat  erythematous.  Neurovascular is intact in operative extremity.   ADMISSION DIAGNOSES:  1. Status post right total knee replacement arthroplasty revision with      effusion.  2. Diabetes.  3. Hypertension.  4. Benign prostatic hypertrophy.   PLAN:  The patient will be admitted for a total knee I&D with cultures and  tissue samples sent for laboratory evaluation.      Omar Chan.      Omar Chan, M.D.  Electronically Signed    DLU/MEDQ  D:  04/08/2006  T:  04/08/2006  Job:  [id number]   cc:   Omar Chan.  Omar Chan, M.D.  Fax: [phone]

## 2011-03-01 IMAGING — CR DG CHEST 2V
2 series · 2 of 2 positions shown · non-contrast
Comparison: 10/28/2007

CLINICAL DATA: Shortness of breath on exertion.  Tobacco use.

CHEST - 2 VIEW

[w chest pa]
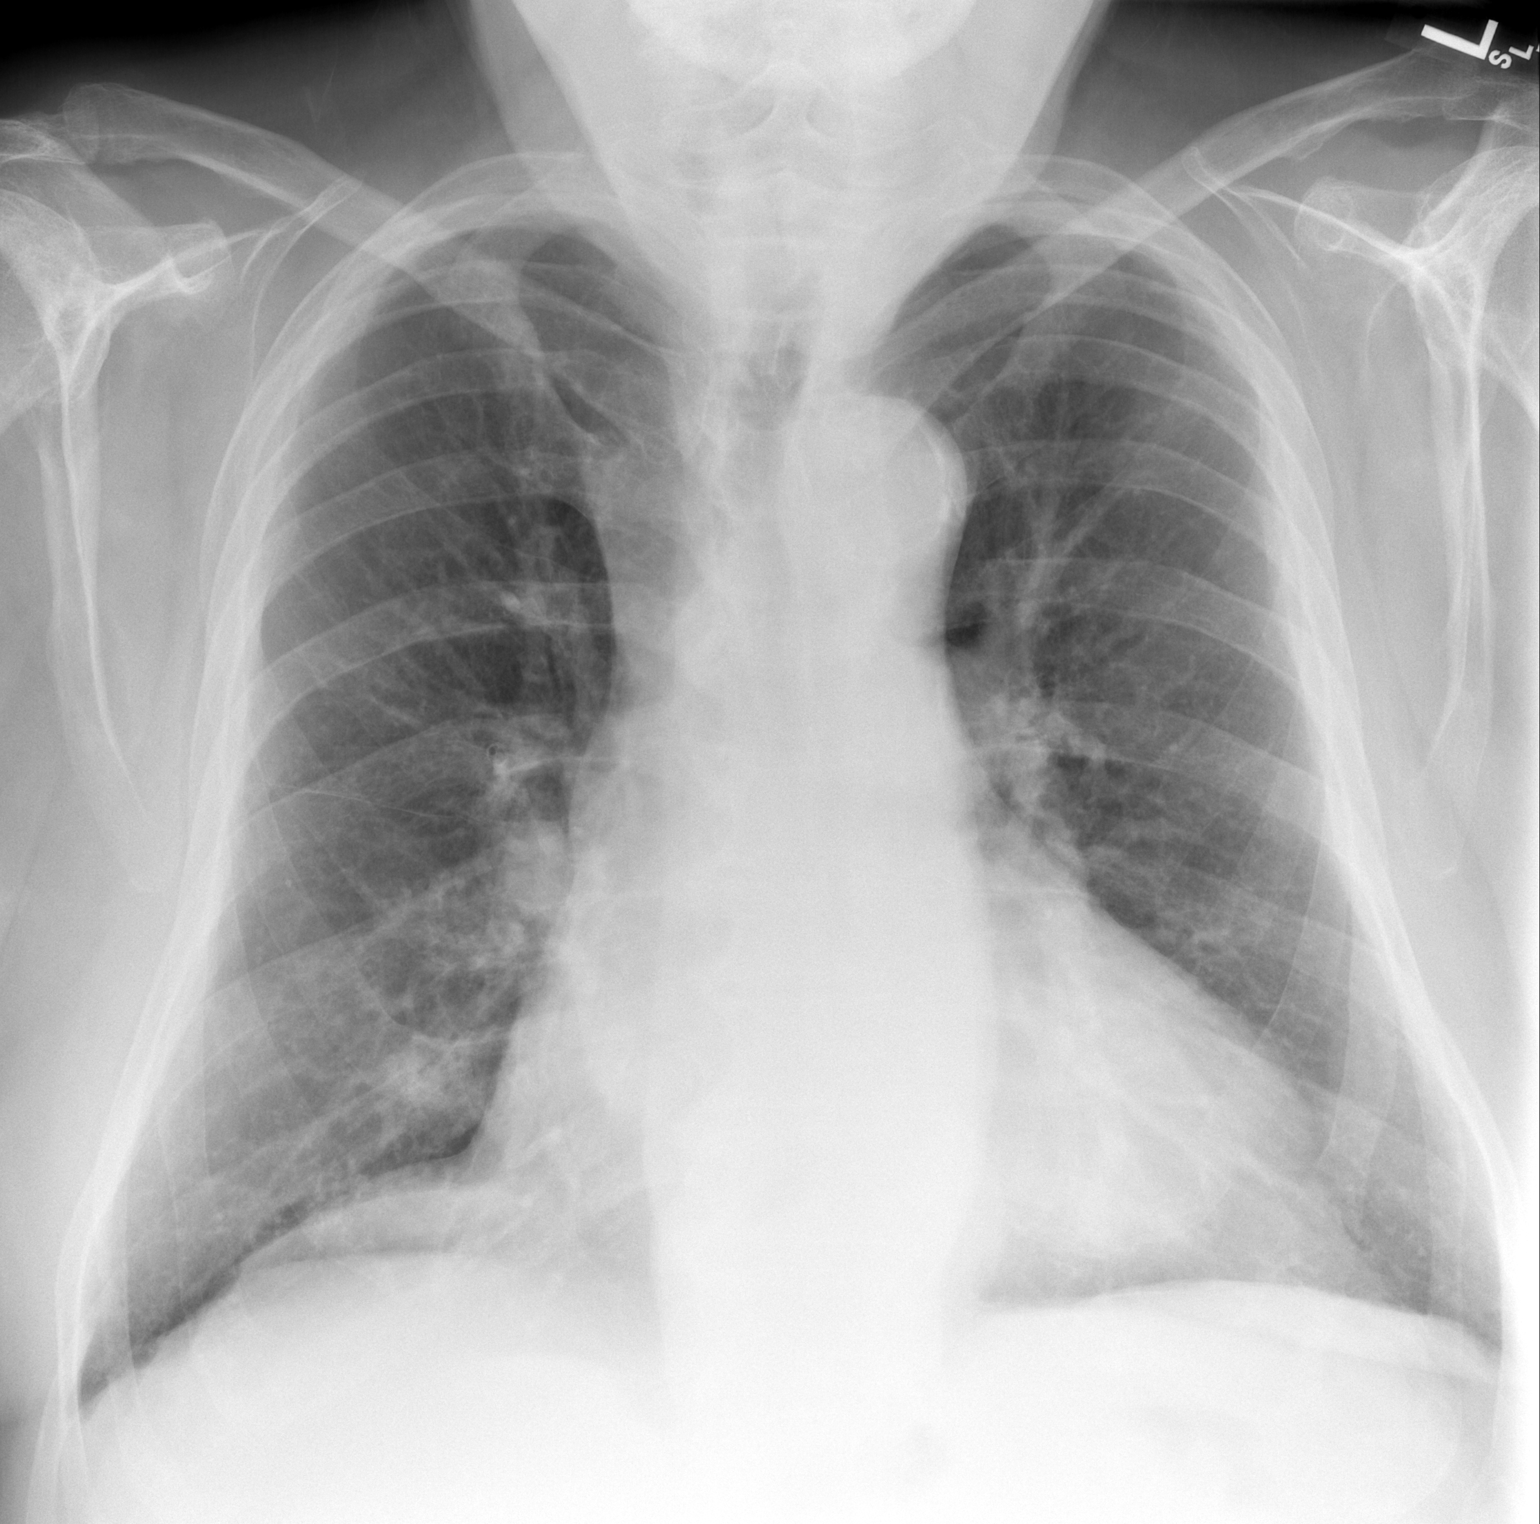

[w chest lat *]
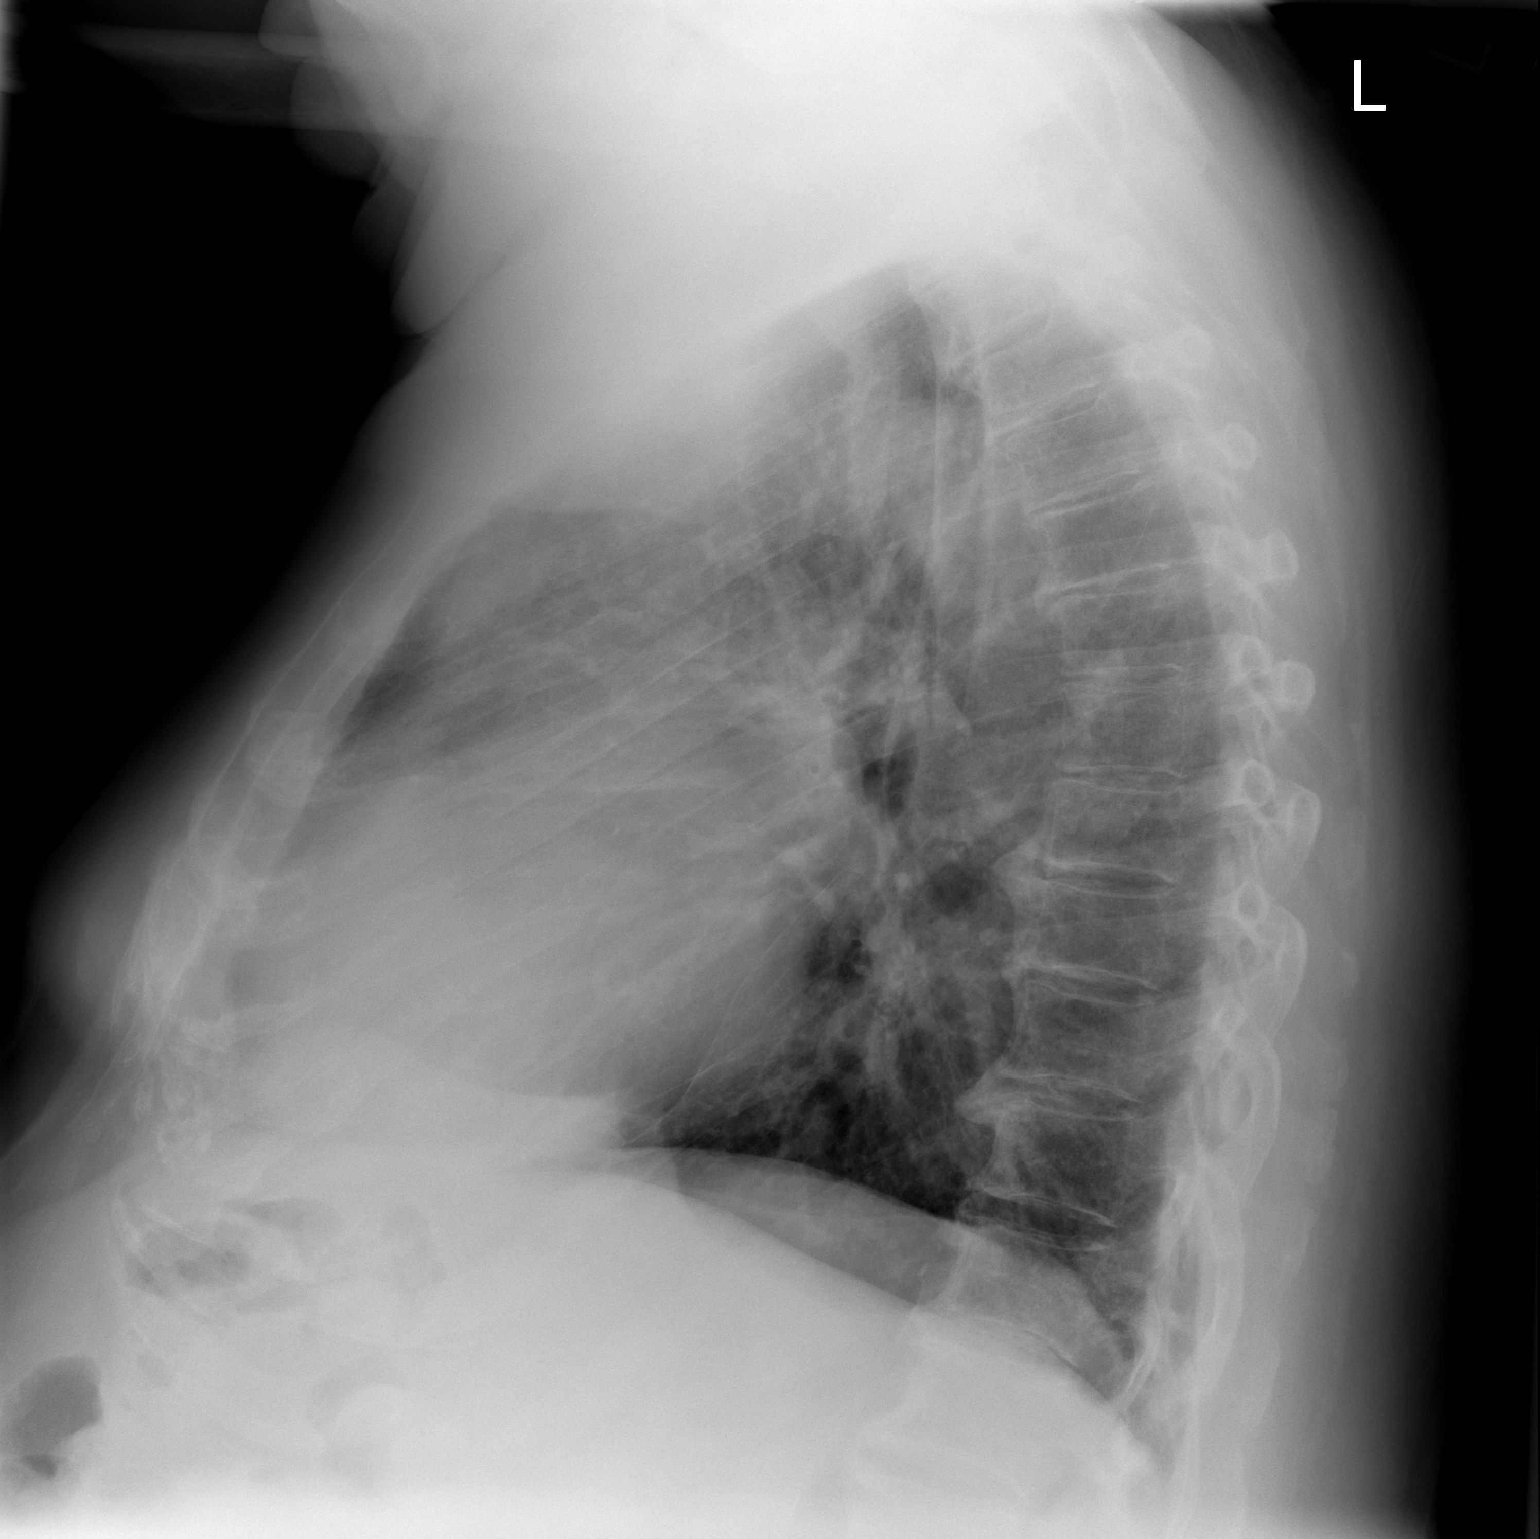

[2 of 2 positions shown; findings below may reference images not displayed]

FINDINGS: Cardiomegaly noted with atherosclerotic calcification of
the aortic arch.

No pleural effusion identified.  Thoracic spondylosis is present.
Mild right basilar subsegmental atelectasis is noted.  No pneumonia
is evident.
IMPRESSION: 1.  Right basilar subsegmental atelectasis.
2.  Cardiomegaly.
3.  Thoracic spondylosis.

## 2011-03-06 ENCOUNTER — Other Ambulatory Visit: Payer: Self-pay | Admitting: *Deleted

## 2011-03-06 ENCOUNTER — Ambulatory Visit (INDEPENDENT_AMBULATORY_CARE_PROVIDER_SITE_OTHER): Payer: 59 | Admitting: Cardiology

## 2011-03-06 ENCOUNTER — Encounter: Payer: Self-pay | Admitting: Cardiology

## 2011-03-06 DIAGNOSIS — M791 Myalgia, unspecified site: Secondary | ICD-10-CM

## 2011-03-06 DIAGNOSIS — I4891 Unspecified atrial fibrillation: Secondary | ICD-10-CM

## 2011-03-06 DIAGNOSIS — E119 Type 2 diabetes mellitus without complications: Secondary | ICD-10-CM

## 2011-03-06 DIAGNOSIS — I10 Essential (primary) hypertension: Secondary | ICD-10-CM

## 2011-03-06 DIAGNOSIS — E785 Hyperlipidemia, unspecified: Secondary | ICD-10-CM

## 2011-03-06 DIAGNOSIS — IMO0001 Reserved for inherently not codable concepts without codable children: Secondary | ICD-10-CM

## 2011-03-06 NOTE — Assessment & Plan Note (Signed)
The patient has been maintaining normal sinus rhythm since his cardioversion one year ago.  He himself is not aware if he is in atrial fibrillation or not.  He remains on long-term Coumadin.

## 2011-03-06 NOTE — Assessment & Plan Note (Signed)
The patient is diabetic.  He controls his blood sugar with diet.  He's not having any hypoglycemic episodes.  His weight is down slightly despite having increased edema in his feet

## 2011-03-06 NOTE — Progress Notes (Signed)
Omar Chan Date of Birth:  06-05-1926 [facility] / [facility] [address]   [address] [place], [place]  [address] [phone]           Fax   [phone]  History of Present Illness: This pleasant 77 year old gentleman is seen for a scheduled followup office visit.  He has a past history of atrial fibrillation and is on long-term Coumadin.  The Coumadin is followed at [facility] at [facility] he was cardioverted in June of 2011 and is maintaining normal sinus rhythm since that time.  He's not having any symptoms of congestive heart failure.  He denies any chest pain or increasing shortness of breath.  He's not been aware of any palpitations.  He does have some chronic lower extremity edema more on the right than on the left.  He has a heart murmur but his echocardiogram on 09/15/09 showed no significant aortic valve disease.  Current Outpatient Prescriptions  Medication Sig Dispense Refill  . acetaminophen (TYLENOL) 500 MG tablet Take 500 mg by mouth as needed.        Marland Kitchen atorvastatin (LIPITOR) 80 MG tablet Take 80 mg by mouth daily.        Marland Kitchen diltiazem (CARDIZEM CD) 180 MG 24 hr capsule Take 180 mg by mouth daily.        Marland Kitchen gemfibrozil (LOPID) 600 MG tablet Take 600 mg by mouth 2 (two) times daily before a meal.        . glipiZIDE (GLUCOTROL) 10 MG tablet Take 10 mg by mouth 2 (two) times daily before a meal.        . hydrochlorothiazide 25 MG tablet Take 25 mg by mouth daily.        Marland Kitchen lisinopril (PRINIVIL,ZESTRIL) 40 MG tablet Take 40 mg by mouth daily.        . metFORMIN (GLUCOPHAGE) 500 MG tablet Take 500 mg by mouth every morning.        . metFORMIN (GLUCOPHAGE) 500 MG tablet Take 1,000 mg by mouth nightly.        . metoprolol tartrate (LOPRESSOR) 25 MG tablet        . warfarin (COUMADIN) 5 MG tablet Take 5 mg by mouth as directed.        Marland Kitchen DISCONTD: metoprolol tartrate (LOPRESSOR) 25 MG tablet TAKE 1 TABLET TWICE A DAY  180 tablet  2    Allergies  Allergen  Reactions  . Aspirin     Patient Active Problem List  Diagnoses  . A-fib  . Diabetes mellitus  . Exogenous obesity  . Hypertension  . Edema    History  Smoking status  . Former Smoker  . Quit date: 07/29/1981  Smokeless tobacco  . Not on file    History  Alcohol Use No    Family History  Problem Relation Age of Onset  . Cancer Mother     deceased  . Pneumonia Father     deceased  . ALS Brother     deceased  . Cancer Brother     deceased/bone ca  . Cancer Sister     deceased    Review of Systems: Constitutional: no fever chills diaphoresis or fatigue or change in weight.  Head and neck: no hearing loss, no epistaxis, no photophobia or visual disturbance. Respiratory: No cough, shortness of breath or wheezing. Cardiovascular: No chest pain peripheral edema, palpitations. Gastrointestinal: No abdominal distention, no abdominal pain, no change in bowel habits hematochezia or melena. Genitourinary: No  dysuria, no frequency, no urgency, no nocturia. Musculoskeletal:No arthralgias, no back pain, no gait disturbance  Neurological: No dizziness, no headaches, no numbness, no seizures, no syncope, no weakness, no tremors. Hematologic: No lymphadenopathy, no easy bruising. Psychiatric: No confusion, no hallucinations, no sleep disturbance.    Physical Exam: Filed Vitals:   03/06/11 0912  BP: 136/76  Pulse: 80  The general appearance reveals a well-developed elderly gentleman in no distress.Pupils equal and reactive.   Extraocular Movements are full.  There is no scleral icterus.  The mouth and pharynx are normal.  The neck is supple.  The carotids reveal no bruits.  The jugular venous pressure is normal.  The thyroid is not enlarged.  There is no lymphadenopathy.  The chest is clear to percussion and auscultation. There are no rales or rhonchi. Expansion of the chest is symmetrical.  The precordium is quiet.  The first heart sound is normal.  The second heart sound  is physiologically split.  There is no  gallop rub or click.  There is no abnormal lift or heave.He does have a soft systolic ejection murmur at the base.The rhythm is regular in normal sinus rhythm clinically The abdomen is soft and nontender. Bowel sounds are normal. The liver and spleen are not enlarged. There Are no abdominal masses. There are no bruits.  Extremities reveal bilateral edema worse on the right.Strength is normal and symmetrical in all extremities.  There is no lateralizing weakness.  There are no sensory deficits.       Assessment / Plan: The patient is to continue on same medication.  He does complain of some aching and decreased strength in his legs he is on low dose statin therapy.  When he returns in 3 months for followup office visit we will check fasting lipid panel and chemistries and also a total CK level.

## 2011-03-06 NOTE — Assessment & Plan Note (Signed)
His blood pressures have been remaining normal when checked.  He's not having any headaches dizziness or syncope.

## 2011-04-22 LAB — CARDIAC PANEL(CRET KIN+CKTOT+MB+TROPI)
CK, MB: 1.6
Relative Index: 1.2

## 2011-04-22 LAB — URINALYSIS, ROUTINE W REFLEX MICROSCOPIC
Nitrite: NEGATIVE
Protein, ur: NEGATIVE
Specific Gravity, Urine: 1.014
Urobilinogen, UA: 0.2

## 2011-04-22 LAB — BASIC METABOLIC PANEL
BUN: 19
CO2: 25
CO2: 27
CO2: 28
Calcium: 7.9 — ABNORMAL LOW
Calcium: 8.5
Calcium: 9.1
Chloride: 107
Creatinine, Ser: 0.8
GFR calc Af Amer: >60
GFR calc non Af Amer: >60
Glucose, Bld: 151 — ABNORMAL HIGH
Glucose, Bld: 166 — ABNORMAL HIGH
Potassium: 4.3
Potassium: 4.3
Sodium: 140
Sodium: 142

## 2011-04-22 LAB — CBC
HCT: 30.6 — ABNORMAL LOW
Hemoglobin: 10.8 — ABNORMAL LOW
Hemoglobin: 9.5 — ABNORMAL LOW
MCHC: 33.8
MCHC: 35.4
Platelets: 225
RBC: 3.02 — ABNORMAL LOW
RBC: 3.38 — ABNORMAL LOW
RDW: 13.9

## 2011-04-22 LAB — TYPE AND SCREEN: ABO/RH(D): O NEG

## 2011-04-22 LAB — APTT: aPTT: 42 — ABNORMAL HIGH

## 2011-04-22 LAB — DIFFERENTIAL
Basophils Absolute: 0
Basophils Relative: 0
Monocytes Absolute: 0.5
Neutro Abs: 5.5
Neutrophils Relative %: 76

## 2011-04-22 LAB — PROTIME-INR
INR: 1
Prothrombin Time: 13.4

## 2011-04-22 LAB — GRAM STAIN

## 2011-04-23 LAB — URINE CULTURE
Culture: NO GROWTH
Special Requests: NEGATIVE

## 2011-04-23 LAB — CBC
MCV: 91.3
Platelets: 161
RBC: 2.96 — ABNORMAL LOW
WBC: 6
WBC: 7.5

## 2011-04-23 LAB — BASIC METABOLIC PANEL
BUN: 15
CO2: 26
Chloride: 108
Chloride: 108
Creatinine, Ser: 0.69
GFR calc Af Amer: >60
GFR calc non Af Amer: >60
Sodium: 140

## 2011-04-23 LAB — CARDIAC PANEL(CRET KIN+CKTOT+MB+TROPI)
CK, MB: 1.9
Total CK: 127

## 2011-06-06 ENCOUNTER — Other Ambulatory Visit (INDEPENDENT_AMBULATORY_CARE_PROVIDER_SITE_OTHER): Payer: 59 | Admitting: *Deleted

## 2011-06-06 ENCOUNTER — Other Ambulatory Visit: Payer: 59 | Admitting: *Deleted

## 2011-06-06 DIAGNOSIS — E785 Hyperlipidemia, unspecified: Secondary | ICD-10-CM

## 2011-06-06 DIAGNOSIS — M791 Myalgia, unspecified site: Secondary | ICD-10-CM

## 2011-06-06 LAB — HEPATIC FUNCTION PANEL
ALT: 13 U/L (ref 0–53)
AST: 16 U/L (ref 0–37)
Albumin: 3.6 g/dL (ref 3.5–5.2)
Total Protein: 6.6 g/dL (ref 6.0–8.3)

## 2011-06-06 LAB — BASIC METABOLIC PANEL
BUN: 20 mg/dL (ref 6–23)
CO2: 28 mEq/L (ref 19–32)
Chloride: 106 mEq/L (ref 96–112)
GFR: 100.9 mL/min (ref >60.00)
Glucose, Bld: 124 mg/dL — ABNORMAL HIGH (ref 70–99)
Potassium: 4.1 mEq/L (ref 3.5–5.1)

## 2011-06-06 LAB — LIPID PANEL
Cholesterol: 154 mg/dL (ref 0–200)
VLDL: 23 mg/dL (ref 0.0–40.0)

## 2011-06-07 ENCOUNTER — Encounter: Payer: Self-pay | Admitting: Cardiology

## 2011-06-07 ENCOUNTER — Ambulatory Visit (INDEPENDENT_AMBULATORY_CARE_PROVIDER_SITE_OTHER): Payer: 59 | Admitting: Cardiology

## 2011-06-07 VITALS — BP 140/70 | HR 62 | Ht 72.0 in | Wt 277.0 lb

## 2011-06-07 DIAGNOSIS — I119 Hypertensive heart disease without heart failure: Secondary | ICD-10-CM

## 2011-06-07 DIAGNOSIS — I4891 Unspecified atrial fibrillation: Secondary | ICD-10-CM

## 2011-06-07 DIAGNOSIS — I10 Essential (primary) hypertension: Secondary | ICD-10-CM

## 2011-06-07 DIAGNOSIS — E7800 Pure hypercholesterolemia, unspecified: Secondary | ICD-10-CM

## 2011-06-07 DIAGNOSIS — E119 Type 2 diabetes mellitus without complications: Secondary | ICD-10-CM

## 2011-06-07 NOTE — Patient Instructions (Signed)
Your physician recommends that you continue on your current medications as directed. Please refer to the Current Medication list given to you today. Your physician recommends that you schedule a follow-up appointment in: 4 months with EKG  

## 2011-06-07 NOTE — Assessment & Plan Note (Signed)
The patient's blood pressure has been remaining stable on his current therapy.  No dizzy spells.  No headaches.

## 2011-06-07 NOTE — Assessment & Plan Note (Signed)
Patient has history of diabetes mellitus and exogenous obesity.  He has not been having any hypoglycemic attacks.

## 2011-06-07 NOTE — Progress Notes (Signed)
[name] Date of Birth:  10-12-1925 [facility] / [facility] [address]   [address] [place], [place]  [address] [phone]           Fax   [phone]  History of Present Illness: This pleasant 77 year old gentleman is seen for a scheduled followup office visit.  He has a past history of chronic atrial fibrillation and is on long-term Coumadin.  The Coumadin is followed at the [place] [facility] at [facility].  He was cardioverted successfully in June of 2011 and is maintaining normal sinus rhythm since then.  He has not been experiencing any symptoms of congestive heart failure.  Current Outpatient Prescriptions  Medication Sig Dispense Refill  . acetaminophen (TYLENOL) 500 MG tablet Take 500 mg by mouth as needed.        Marland Kitchen atorvastatin (LIPITOR) 80 MG tablet Take 80 mg by mouth daily.        Marland Kitchen diltiazem (CARDIZEM CD) 180 MG 24 hr capsule Take 180 mg by mouth daily.        Marland Kitchen gemfibrozil (LOPID) 600 MG tablet Take 600 mg by mouth 2 (two) times daily before a meal.        . glipiZIDE (GLUCOTROL) 10 MG tablet Take 10 mg by mouth 2 (two) times daily before a meal.        . hydrochlorothiazide 25 MG tablet Take 25 mg by mouth daily.        Marland Kitchen lisinopril (PRINIVIL,ZESTRIL) 40 MG tablet Take 40 mg by mouth daily.        . metFORMIN (GLUCOPHAGE) 500 MG tablet Take 500 mg by mouth every morning. 1-2 tablets 3 times daily      . metoprolol tartrate (LOPRESSOR) 25 MG tablet        . warfarin (COUMADIN) 5 MG tablet Take 5 mg by mouth as directed.        Marland Kitchen DISCONTD: metFORMIN (GLUCOPHAGE) 500 MG tablet Take 1,000 mg by mouth nightly.          Allergies  Allergen Reactions  . Aspirin     Patient Active Problem List  Diagnoses  . A-fib  . Diabetes mellitus  . Exogenous obesity  . Hypertension  . Edema    History  Smoking status  . Former Smoker  . Quit date: 07/29/1981  Smokeless tobacco  . Not on file    History  Alcohol Use No    Family History    Problem Relation Age of Onset  . Cancer Mother     deceased  . Pneumonia Father     deceased  . ALS Brother     deceased  . Cancer Brother     deceased/bone ca  . Cancer Sister     deceased    Review of Systems: Constitutional: no fever chills diaphoresis or fatigue or change in weight.  Head and neck: no hearing loss, no epistaxis, no photophobia or visual disturbance. Respiratory: No cough, shortness of breath or wheezing. Cardiovascular: No chest pain peripheral edema, palpitations. Gastrointestinal: No abdominal distention, no abdominal pain, no change in bowel habits hematochezia or melena. Genitourinary: No dysuria, no frequency, no urgency, no nocturia. Musculoskeletal:No arthralgias, no back pain, no gait disturbance or myalgias. Neurological: No dizziness, no headaches, no numbness, no seizures, no syncope, no weakness, no tremors. Hematologic: No lymphadenopathy, no easy bruising. Psychiatric: No confusion, no hallucinations, no sleep disturbance.    Physical Exam: Filed Vitals:   06/07/11 0905  BP: 140/70  Pulse:  62  The patient appears to be in no distress.  Head and neck exam reveals that the pupils are equal and reactive.  The extraocular movements are full.  There is no scleral icterus.  Mouth and pharynx are benign.  No lymphadenopathy.  No carotid bruits.  The jugular venous pressure is normal.  Thyroid is not enlarged or tender.  Chest is clear to percussion and auscultation.  No rales or rhonchi.  Expansion of the chest is symmetrical.  Heart reveals no abnormal lift or heave.  First and second heart sounds are normal.  There is a soft basilar systolic ejection murmur  The abdomen is soft and nontender.  Bowel sounds are normoactive.  There is no hepatosplenomegaly or mass.  There are no abdominal bruits.  Extremities reveal no phlebitis or edema.  Pedal pulses are good.  There is no cyanosis or clubbing.  He does have significant osteoarthritic  deformity of his right knee.  His orthopedist Dr. Jerl Santos has not encouraged surgery.  Neurologic exam is normal strength and no lateralizing weakness.  No sensory deficits.  Integument reveals no rash    Assessment / Plan: Continue careful diet and same medication and try to lose more weight.  His weight this time is up 4 pounds.  Return in 4 months for a followup office visit and EKG.  We will send copies of recent labs to Dr. Catha Gosselin

## 2011-06-07 NOTE — Assessment & Plan Note (Signed)
His rhythm remains regular today.  We did not do an EKG.  He has not been aware of any palpitations recently.

## 2011-06-10 ENCOUNTER — Telehealth: Payer: Self-pay | Admitting: Cardiology

## 2011-06-10 NOTE — Telephone Encounter (Signed)
Labs,OV note faxed to Jan/Dr.Little Office @ [phone]  06/10/11/km

## 2011-10-04 ENCOUNTER — Ambulatory Visit (INDEPENDENT_AMBULATORY_CARE_PROVIDER_SITE_OTHER): Payer: 59 | Admitting: Cardiology

## 2011-10-04 ENCOUNTER — Encounter: Payer: Self-pay | Admitting: Cardiology

## 2011-10-04 VITALS — BP 138/80 | HR 60 | Ht 72.0 in | Wt 275.0 lb

## 2011-10-04 DIAGNOSIS — I359 Nonrheumatic aortic valve disorder, unspecified: Secondary | ICD-10-CM

## 2011-10-04 DIAGNOSIS — E119 Type 2 diabetes mellitus without complications: Secondary | ICD-10-CM

## 2011-10-04 DIAGNOSIS — R609 Edema, unspecified: Secondary | ICD-10-CM

## 2011-10-04 DIAGNOSIS — I119 Hypertensive heart disease without heart failure: Secondary | ICD-10-CM

## 2011-10-04 DIAGNOSIS — I4891 Unspecified atrial fibrillation: Secondary | ICD-10-CM

## 2011-10-04 DIAGNOSIS — I358 Other nonrheumatic aortic valve disorders: Secondary | ICD-10-CM

## 2011-10-04 NOTE — Assessment & Plan Note (Signed)
The patient has had no palpitations or recurrent atrial fibrillation.  He is not having any problems from his Coumadin

## 2011-10-04 NOTE — Progress Notes (Signed)
[name] Date of Birth:  18-Sep-1925 [facility] [address] [place], [place]  [address] [phone]  Fax   [phone]  HPI: This pleasant 78 year old gentleman is a seen for a scheduled 6 month followup office visit.  As a history of chronic atrial fibrillation and is on long-term Coumadin.  His Coumadin is followed at [facility] clinic for he is maintaining normal sinus rhythm since successful cardioversion in June of 2011.  Denies any symptoms of congestive heart failure.  He does have moderate exertional shortness of breath.  He is relatively sedentary because of severe arthritis of his knees.  Current Outpatient Prescriptions  Medication Sig Dispense Refill  . acetaminophen (TYLENOL) 500 MG tablet Take 500 mg by mouth as needed.        Marland Kitchen atorvastatin (LIPITOR) 80 MG tablet Take 80 mg by mouth daily.        Marland Kitchen diltiazem (CARDIZEM CD) 180 MG 24 hr capsule Take 180 mg by mouth daily.        Marland Kitchen gemfibrozil (LOPID) 600 MG tablet Take 600 mg by mouth 2 (two) times daily before a meal.        . glipiZIDE (GLUCOTROL) 10 MG tablet Take 10 mg by mouth 2 (two) times daily before a meal.        . hydrochlorothiazide 25 MG tablet Take 25 mg by mouth daily.        Marland Kitchen lisinopril (PRINIVIL,ZESTRIL) 40 MG tablet Take 40 mg by mouth daily.        . metFORMIN (GLUCOPHAGE) 500 MG tablet Take 500 mg by mouth every morning. 1-2 tablets 3 times daily      . metoprolol tartrate (LOPRESSOR) 25 MG tablet        . warfarin (COUMADIN) 5 MG tablet Take 5 mg by mouth as directed.          Allergies  Allergen Reactions  . Aspirin     Patient Active Problem List  Diagnoses  . A-fib  . Diabetes mellitus  . Exogenous obesity  . Hypertension  . Edema    History  Smoking status  . Former Smoker  . Quit date: 07/29/1981  Smokeless tobacco  . Not on file    History  Alcohol Use No    Family History  Problem Relation Age of Onset  . Cancer Mother     deceased  . Pneumonia  Father     deceased  . ALS Brother     deceased  . Cancer Brother     deceased/bone ca  . Cancer Sister     deceased    Review of Systems: The patient denies any heat or cold intolerance.  No weight gain or weight loss.  The patient denies headaches or blurry vision.  There is no cough or sputum production.  The patient denies dizziness.  There is no hematuria or hematochezia.  The patient denies any muscle aches or arthritis.  The patient denies any rash.  The patient denies frequent falling or instability.  There is no history of depression or anxiety.  All other systems were reviewed and are negative.   Physical Exam: Filed Vitals:   10/04/11 0902  BP: 138/80  Pulse: 60   The general appearance reveals a well-developed large gentleman in no distress.Pupils equal and reactive.   Extraocular Movements are full.  There is no scleral icterus.  The mouth and pharynx are normal.  The neck is supple.  The carotids reveal no bruits.  The jugular venous pressure is normal.  The thyroid is not enlarged.  There is no lymphadenopathy.  The chest is clear to percussion and auscultation. There are no rales or rhonchi. Expansion of the chest is symmetrical.  The precordium is quiet.  The first heart sound is normal.  The second heart sound is physiologically split.  There is no  gallop rub or click.  There is a soft systolic ejection murmur at the base. There is no abnormal lift or heave.The abdomen is soft and nontender. Bowel sounds are normal. The liver and spleen are not enlarged. There Are no abdominal masses. There are no bruits.  The pedal pulses are good.  There is no phlebitis .  There is trace edema  There is no cyanosis or clubbing.  EKG shows normal sinus rhythm at 59 per minute without ischemic changes.  He does have first degree AV block.  Assessment / Plan: Continue same medication and be rechecked in 6 months

## 2011-10-04 NOTE — Assessment & Plan Note (Signed)
The patient has a history of dependent edema which has not worsened since last visit.  He is sedentary because of his arthritis and walks very little and has to walk with a cane.

## 2011-10-04 NOTE — Assessment & Plan Note (Signed)
The patient denies any hypoglycemic reactions from his diabetes therapy

## 2011-10-04 NOTE — Patient Instructions (Signed)
Your physician recommends that you continue on your current medications as directed. Please refer to the Current Medication list given to you today.  Your physician wants you to follow-up in: 6 months. You will receive a reminder letter in the mail two months in advance. If you don't receive a letter, please call our office to schedule the follow-up appointment.  

## 2012-04-01 ENCOUNTER — Encounter: Payer: Self-pay | Admitting: Cardiology

## 2012-04-01 ENCOUNTER — Ambulatory Visit (INDEPENDENT_AMBULATORY_CARE_PROVIDER_SITE_OTHER): Payer: 59 | Admitting: Cardiology

## 2012-04-01 VITALS — BP 128/68 | HR 60 | Ht 72.0 in | Wt 278.0 lb

## 2012-04-01 DIAGNOSIS — E6609 Other obesity due to excess calories: Secondary | ICD-10-CM

## 2012-04-01 DIAGNOSIS — E669 Obesity, unspecified: Secondary | ICD-10-CM

## 2012-04-01 DIAGNOSIS — E7800 Pure hypercholesterolemia, unspecified: Secondary | ICD-10-CM

## 2012-04-01 DIAGNOSIS — I10 Essential (primary) hypertension: Secondary | ICD-10-CM

## 2012-04-01 DIAGNOSIS — I480 Paroxysmal atrial fibrillation: Secondary | ICD-10-CM

## 2012-04-01 DIAGNOSIS — I119 Hypertensive heart disease without heart failure: Secondary | ICD-10-CM

## 2012-04-01 DIAGNOSIS — I4891 Unspecified atrial fibrillation: Secondary | ICD-10-CM

## 2012-04-01 NOTE — Assessment & Plan Note (Signed)
The patient is remaining in normal sinus rhythm following his cardioversion 2 years ago.  He himself is not aware of his rhythm and remains on long-term Coumadin.  He has not had any TIA symptoms.

## 2012-04-01 NOTE — Progress Notes (Signed)
Omar Chan Date of Birth:  June 19, 1926 [facility] [address] [place], [place]  [address] [phone]         Fax   [phone]  History of Present Illness: This pleasant 78 year old gentleman is a seen for a scheduled 6 month followup office visit. As a history of chronic atrial fibrillation and is on long-term Coumadin. His Coumadin is followed at [facility] clinic for he is maintaining normal sinus rhythm since successful cardioversion in June of 2011. Denies any symptoms of congestive heart failure. He does have moderate exertional shortness of breath. He is relatively sedentary because of severe arthritis of his knees.   Current Outpatient Prescriptions  Medication Sig Dispense Refill  . acetaminophen (TYLENOL) 500 MG tablet Take 500 mg by mouth as needed.        Marland Kitchen atorvastatin (LIPITOR) 80 MG tablet Take 80 mg by mouth daily.        Marland Kitchen diltiazem (CARDIZEM CD) 180 MG 24 hr capsule Take 180 mg by mouth daily.        Marland Kitchen gemfibrozil (LOPID) 600 MG tablet Take 600 mg by mouth 2 (two) times daily before a meal.        . glipiZIDE (GLUCOTROL) 10 MG tablet Take 10 mg by mouth 2 (two) times daily before a meal.        . hydrochlorothiazide 25 MG tablet Take 25 mg by mouth daily.        Marland Kitchen lisinopril (PRINIVIL,ZESTRIL) 40 MG tablet Take 40 mg by mouth daily.        . metFORMIN (GLUCOPHAGE) 500 MG tablet Take 500 mg by mouth every morning. 1 in the am and 2 in the pm      . metoprolol tartrate (LOPRESSOR) 25 MG tablet daily.       Marland Kitchen warfarin (COUMADIN) 5 MG tablet Take 5 mg by mouth as directed.          Allergies  Allergen Reactions  . Aspirin     Patient Active Problem List  Diagnosis  . A-fib  . Diabetes mellitus  . Exogenous obesity  . Hypertension  . Edema    History  Smoking status  . Former Smoker  . Quit date: 07/29/1981  Smokeless tobacco  . Not on file    History  Alcohol Use No    Family History  Problem Relation Age of Onset  . Cancer  Mother     deceased  . Pneumonia Father     deceased  . ALS Brother     deceased  . Cancer Brother     deceased/bone ca  . Cancer Sister     deceased    Review of Systems: Constitutional: no fever chills diaphoresis or fatigue or change in weight.  Head and neck: no hearing loss, no epistaxis, no photophobia or visual disturbance. Respiratory: No cough, shortness of breath or wheezing. Cardiovascular: No chest pain peripheral edema, palpitations. Gastrointestinal: No abdominal distention, no abdominal pain, no change in bowel habits hematochezia or melena. Genitourinary: No dysuria, no frequency, no urgency, no nocturia. Musculoskeletal:No arthralgias, no back pain, no gait disturbance or myalgias. Neurological: No dizziness, no headaches, no numbness, no seizures, no syncope, no weakness, no tremors. Hematologic: No lymphadenopathy, no easy bruising. Psychiatric: No confusion, no hallucinations, no sleep disturbance.    Physical Exam: Filed Vitals:   04/01/12 1410  BP: 128/68  Pulse: 60   the general appearance reveals a well-developed well-nourished elderly gentleman in no distress.The head and  neck exam reveals pupils equal and reactive.  Extraocular movements are full.  There is no scleral icterus.  The mouth and pharynx are normal.  The neck is supple.  The carotids reveal no bruits.  The jugular venous pressure is normal.  The  thyroid is not enlarged.  There is no lymphadenopathy.  The chest is clear to percussion and auscultation.  There are no rales or rhonchi.  Expansion of the chest is symmetrical.  The precordium is quiet.  The first heart sound is normal.  The second heart sound is physiologically split.  There is no  gallop rub or click.  There is a grade 2/6 systolic ejection murmur at the base. There is no abnormal lift or heave.  The abdomen is soft and nontender.  The bowel sounds are normal.  The liver and spleen are not enlarged.  There are no abdominal masses.   There are no abdominal bruits.  Extremities reveal good pedal pulses.  There is mild to moderate pretibial edema. There is no cyanosis or clubbing.  Strength is normal and symmetrical in all extremities.  There is no lateralizing weakness.  There are no sensory deficits.  The skin is warm and dry.  There is no rash.     Assessment / Plan: The patient is to continue same medication.  Recheck in 6 months for followup office visit and EKG

## 2012-04-01 NOTE — Assessment & Plan Note (Signed)
Blood pressure has been maintained in a good range on current therapy.  He has not had any dizzy spells or syncope.

## 2012-04-01 NOTE — Assessment & Plan Note (Signed)
The patient has a history of diabetes mellitus.  He has not been having any hypoglycemic episodes

## 2012-04-01 NOTE — Assessment & Plan Note (Signed)
Patient continues to be overweight.  His weight is up 3 pounds since last visit.  The patient went to a [employer] reunion over Labor Day weekend and ate well and attributes his weight gain to this past weekend.

## 2012-04-01 NOTE — Patient Instructions (Addendum)
Your physician recommends that you continue on your current medications as directed. Please refer to the Current Medication list given to you today.  Your physician wants you to follow-up in: 6 months. You will receive a reminder letter in the mail two months in advance. If you don't receive a letter, please call our office to schedule the follow-up appointment.  

## 2012-04-13 ENCOUNTER — Encounter (HOSPITAL_COMMUNITY): Payer: Self-pay | Admitting: Emergency Medicine

## 2012-04-13 ENCOUNTER — Emergency Department (HOSPITAL_COMMUNITY)
Admission: EM | Admit: 2012-04-13 | Discharge: 2012-04-13 | Discharge status: Against Medical Advice | Payer: 59 | Attending: Emergency Medicine | Admitting: Emergency Medicine

## 2012-04-13 ENCOUNTER — Encounter (HOSPITAL_COMMUNITY): Payer: Self-pay | Admitting: *Deleted

## 2012-04-13 ENCOUNTER — Emergency Department (HOSPITAL_COMMUNITY)
Admission: EM | Admit: 2012-04-13 | Discharge: 2012-04-13 | Disposition: A | Discharge status: Home / Self Care | Payer: Medicare Other | Attending: Emergency Medicine | Admitting: Emergency Medicine

## 2012-04-13 ENCOUNTER — Emergency Department (HOSPITAL_COMMUNITY): Payer: Medicare Other

## 2012-04-13 DIAGNOSIS — I10 Essential (primary) hypertension: Secondary | ICD-10-CM | POA: Insufficient documentation

## 2012-04-13 DIAGNOSIS — R1310 Dysphagia, unspecified: Secondary | ICD-10-CM

## 2012-04-13 DIAGNOSIS — I4891 Unspecified atrial fibrillation: Secondary | ICD-10-CM | POA: Insufficient documentation

## 2012-04-13 DIAGNOSIS — Z7901 Long term (current) use of anticoagulants: Secondary | ICD-10-CM | POA: Insufficient documentation

## 2012-04-13 DIAGNOSIS — Z87891 Personal history of nicotine dependence: Secondary | ICD-10-CM | POA: Insufficient documentation

## 2012-04-13 DIAGNOSIS — E119 Type 2 diabetes mellitus without complications: Secondary | ICD-10-CM | POA: Insufficient documentation

## 2012-04-13 DIAGNOSIS — K219 Gastro-esophageal reflux disease without esophagitis: Secondary | ICD-10-CM | POA: Insufficient documentation

## 2012-04-13 DIAGNOSIS — E785 Hyperlipidemia, unspecified: Secondary | ICD-10-CM | POA: Insufficient documentation

## 2012-04-13 LAB — COMPREHENSIVE METABOLIC PANEL
ALT: 9 U/L (ref 0–53)
AST: 16 U/L (ref 0–37)
Albumin: 3.7 g/dL (ref 3.5–5.2)
CO2: 25 mEq/L (ref 19–32)
Chloride: 102 mEq/L (ref 96–112)
GFR calc non Af Amer: 83 mL/min — ABNORMAL LOW (ref >90)
Sodium: 139 mEq/L (ref 135–145)
Total Bilirubin: 0.4 mg/dL (ref 0.3–1.2)

## 2012-04-13 LAB — CBC WITH DIFFERENTIAL/PLATELET
Basophils Absolute: 0 10*3/uL (ref 0.0–0.1)
Basophils Relative: 0 % (ref 0–1)
Lymphocytes Relative: 12 % (ref 12–46)
Neutro Abs: 8.8 10*3/uL — ABNORMAL HIGH (ref 1.7–7.7)
Neutrophils Relative %: 81 % — ABNORMAL HIGH (ref 43–77)
Platelets: 288 10*3/uL (ref 150–400)
RDW: 14.4 % (ref 11.5–15.5)
WBC: 11 10*3/uL — ABNORMAL HIGH (ref 4.0–10.5)

## 2012-04-13 NOTE — ED Provider Notes (Signed)
History     CSN: [id number]  Arrival date & time 04/13/12  1148   First MD Initiated Contact with Patient 04/13/12 1355      Chief Complaint  Patient presents with  . Dysphagia    (Consider location/radiation/quality/duration/timing/severity/associated sxs/prior treatment) HPI Comments: Omar Chan is a 78 y.o. Male who began having trouble swallowing yesterday afternoon. He was unable to tolerate any food or fluids yesterday, without vomiting. This morning wen he drank coffee, he began vomiting. He is controlling his secretions now, by his report. He's never had this previously. He denies recent fever, chills, nausea, vomiting, chest pain, shortness of breath, or back pain.  The history is provided by the patient.    Past Medical History  Diagnosis Date  . Paroxysmal atrial fibrillation     cardioversion 01/25/10  . Diabetes mellitus   . Exogenous obesity   . Hypertension   . Microcytic anemia   . Hyperlipidemia   . GERD (gastroesophageal reflux disease)   . Hiatal hernia   . Osteoarthritis     Past Surgical History  Procedure Date  . Total knee arthroplasty     B  . Cardioversion 01/25/10  . Cardiovascular stress test 2007    No ischemia. EF 61%  . US echocardiography February 2011    Normal EF; LAE and RVE    Family History  Problem Relation Age of Onset  . Cancer Mother     deceased  . Pneumonia Father     deceased  . ALS Brother     deceased  . Cancer Brother     deceased/bone ca  . Cancer Sister     deceased    History  Substance Use Topics  . Smoking status: Former Smoker    Quit date: 07/29/1981  . Smokeless tobacco: Not on file  . Alcohol Use: No      Review of Systems  All other systems reviewed and are negative.    Allergies  Aspirin  Home Medications   Current Outpatient Rx  Name Route Sig Dispense Refill  . ACETAMINOPHEN 500 MG PO TABS Oral Take 1,000 mg by mouth every 6 (six) hours as needed. For pain    . DILTIAZEM HCL  ER COATED BEADS 180 MG PO CP24 Oral Take 180 mg by mouth daily.     Marland Kitchen FERROUS SULFATE 325 (65 FE) MG PO TABS Oral Take 325 mg by mouth daily with breakfast.    . GEMFIBROZIL 600 MG PO TABS Oral Take 600 mg by mouth 2 (two) times daily before a meal.     . GLIPIZIDE 10 MG PO TABS Oral Take 10 mg by mouth 2 (two) times daily before a meal.     . HYDROCHLOROTHIAZIDE 25 MG PO TABS Oral Take 25 mg by mouth daily.     Marland Kitchen LISINOPRIL 40 MG PO TABS Oral Take 40 mg by mouth daily.     Marland Kitchen METFORMIN HCL 500 MG PO TABS Oral Take 500-1,000 mg by mouth daily. 1 in the am and 2 in the pm    . METOPROLOL TARTRATE 25 MG PO TABS Oral Take 25 mg by mouth daily.     . WARFARIN SODIUM 5 MG PO TABS Oral Take 5-7.5 mg by mouth daily. Take 1 tablet (5 mg total) every day except on Wednesday 1.5 tablets (7.5 mg total)    . ATORVASTATIN CALCIUM 80 MG PO TABS Oral Take 80 mg by mouth daily.       BP  128/77  Pulse 79  Temp 97.9 F (36.6 C) (Oral)  Resp 16  Ht 6' (1.829 m)  Wt 278 lb (126.1 kg)  BMI 37.70 kg/m2  SpO2 96%  Physical Exam  Nursing note and vitals reviewed. Constitutional: He is oriented to person, place, and time. He appears well-developed and well-nourished.  HENT:  Head: Normocephalic and atraumatic.  Right Ear: External ear normal.  Left Ear: External ear normal.  Eyes: Conjunctivae normal and EOM are normal. Pupils are equal, round, and reactive to light.  Neck: Normal range of motion and phonation normal. Neck supple.  Cardiovascular: Normal rate, regular rhythm, normal heart sounds and intact distal pulses.   Pulmonary/Chest: Effort normal and breath sounds normal. He exhibits no bony tenderness.  Abdominal: Soft. Normal appearance. There is no tenderness.  Musculoskeletal: Normal range of motion.  Neurological: He is alert and oriented to person, place, and time. He has normal strength. No cranial nerve deficit or sensory deficit. He exhibits normal muscle tone. Coordination normal.  Skin:  Skin is warm, dry and intact.  Psychiatric: He has a normal mood and affect. His behavior is normal. Judgment and thought content normal.    ED Course  Procedures (including critical care time)  Water Trial :  He tolerated water, followed by crackers, without distress.  Labs Reviewed  CBC WITH DIFFERENTIAL - Abnormal; Notable for the following:    WBC 11.0 (*)     Neutrophils Relative 81 (*)     Neutro Abs 8.8 (*)     All other components within normal limits  COMPREHENSIVE METABOLIC PANEL - Abnormal; Notable for the following:    GFR calc non Af Amer 83 (*)     All other components within normal limits   Dg Chest 2 View  04/13/2012  *RADIOLOGY REPORT*  Clinical Data: Dysphagia, hypertension, diabetes, former smoker  CHEST - 2 VIEW  Comparison: 01/12/2010, 09/14/2009  Findings: Enlargement of cardiac silhouette. Calcified tortuous aorta. Mediastinal contours and pulmonary vascularity otherwise normal. Lungs clear. No pleural effusion or pneumothorax. Scattered end plate spur formation thoracic spine.  IMPRESSION: Enlargement of cardiac silhouette. No acute abnormalities.   Original Report Authenticated By: Lollie Marrow, M.D.      1. Dysphagia       MDM  Nonspecific  swollowing, disorder, improved. He may have an unknown type of esophageal obstruction which is likely partial. Stable for discharge, with close outpatient followup.     Plan: Home Medications- usual; Home Treatments- Gradually advance diet; Recommended follow up- GI evaluation as OP  Flint Melter, MD 04/13/12 1659

## 2012-04-13 NOTE — ED Notes (Signed)
Pt sts trouble swallowing and feels like something is stuck x 2 days; pt handling secretions but sts unable to swallow PO; no distress noted

## 2012-04-13 NOTE — ED Notes (Addendum)
Pt left after being seen in triage. States the wait is too long.

## 2012-04-13 NOTE — ED Notes (Addendum)
Pt trial for water went well without and with straw. Attempted to try a cracker and pt tolerated the first cracker but when pt attempted to eat the second one he began to cough. Listened to lungs afterwards and pt was clear; he was able to clear the cracker that he stated went down the wrong way.

## 2012-04-13 NOTE — ED Notes (Addendum)
Pt reports experiencing difficulty swallowing since yesterday afternoon. States that he has been choking and spitting up liquids and food. Pt reports a history of dysphagia, but PCP told him to come to the ER. Pt states that he drank water while in the waiting room without difficulty. Pt thinks swallowing issue has been resolved but would still like to be checked out.

## 2012-04-21 ENCOUNTER — Other Ambulatory Visit: Payer: Self-pay | Admitting: Gastroenterology

## 2012-04-21 DIAGNOSIS — T18108A Unspecified foreign body in esophagus causing other injury, initial encounter: Secondary | ICD-10-CM

## 2012-04-29 ENCOUNTER — Ambulatory Visit
Admission: RE | Admit: 2012-04-29 | Discharge: 2012-04-29 | Disposition: A | Discharge status: Home / Self Care | Payer: Medicare Other | Source: Ambulatory Visit | Attending: Gastroenterology | Admitting: Gastroenterology

## 2012-04-29 DIAGNOSIS — T18108A Unspecified foreign body in esophagus causing other injury, initial encounter: Secondary | ICD-10-CM

## 2013-07-20 ENCOUNTER — Other Ambulatory Visit: Payer: Self-pay | Admitting: Family Medicine

## 2013-07-20 DIAGNOSIS — Z5181 Encounter for therapeutic drug level monitoring: Secondary | ICD-10-CM

## 2013-07-20 LAB — PROTIME-INR: INR: 2 — AB (ref 0.9–1.1)

## 2013-07-26 ENCOUNTER — Ambulatory Visit
Admission: RE | Admit: 2013-07-26 | Discharge: 2013-07-26 | Disposition: A | Discharge status: Home / Self Care | Payer: Medicare Other | Source: Ambulatory Visit | Attending: Family Medicine | Admitting: Family Medicine

## 2013-07-26 DIAGNOSIS — Z5181 Encounter for therapeutic drug level monitoring: Secondary | ICD-10-CM

## 2013-08-04 ENCOUNTER — Other Ambulatory Visit (HOSPITAL_COMMUNITY): Payer: Self-pay | Admitting: Radiology

## 2013-08-04 ENCOUNTER — Encounter: Payer: Self-pay | Admitting: Cardiovascular Disease

## 2013-08-04 ENCOUNTER — Ambulatory Visit (INDEPENDENT_AMBULATORY_CARE_PROVIDER_SITE_OTHER): Payer: Medicare Other | Admitting: Cardiovascular Disease

## 2013-08-04 ENCOUNTER — Ambulatory Visit (HOSPITAL_COMMUNITY): Payer: Medicare Other | Attending: Cardiovascular Disease | Admitting: Radiology

## 2013-08-04 VITALS — BP 140/90 | HR 139 | Ht 72.0 in | Wt 262.8 lb

## 2013-08-04 DIAGNOSIS — E119 Type 2 diabetes mellitus without complications: Secondary | ICD-10-CM | POA: Insufficient documentation

## 2013-08-04 DIAGNOSIS — I079 Rheumatic tricuspid valve disease, unspecified: Secondary | ICD-10-CM | POA: Insufficient documentation

## 2013-08-04 DIAGNOSIS — I4891 Unspecified atrial fibrillation: Secondary | ICD-10-CM

## 2013-08-04 DIAGNOSIS — Z6835 Body mass index (BMI) 35.0-35.9, adult: Secondary | ICD-10-CM | POA: Insufficient documentation

## 2013-08-04 DIAGNOSIS — I10 Essential (primary) hypertension: Secondary | ICD-10-CM | POA: Insufficient documentation

## 2013-08-04 DIAGNOSIS — Z8673 Personal history of transient ischemic attack (TIA), and cerebral infarction without residual deficits: Secondary | ICD-10-CM | POA: Insufficient documentation

## 2013-08-04 DIAGNOSIS — G459 Transient cerebral ischemic attack, unspecified: Secondary | ICD-10-CM

## 2013-08-04 DIAGNOSIS — E785 Hyperlipidemia, unspecified: Secondary | ICD-10-CM | POA: Insufficient documentation

## 2013-08-04 DIAGNOSIS — E669 Obesity, unspecified: Secondary | ICD-10-CM | POA: Insufficient documentation

## 2013-08-04 MED ORDER — METOPROLOL TARTRATE 50 MG PO TABS: 50.0000 mg | ORAL_TABLET | Freq: Two times a day (BID) | ORAL | Status: DC

## 2013-08-04 MED ORDER — DILTIAZEM HCL ER COATED BEADS 360 MG PO CP24: 360.0000 mg | ORAL_CAPSULE | Freq: Every day | ORAL | Status: DC

## 2013-08-04 NOTE — Progress Notes (Signed)
Echocardiogram performed.  

## 2013-08-04 NOTE — Patient Instructions (Addendum)
Your physician has recommended you make the following change in your medication:  Increase Cardizem to 360 mg once daily Increase Metoprolol (Lopressor) to 50 mg twice daily  Keep your appointment with Norma FredricksonLori Gerhardt, NP on Tuesday 1/13 at 9:00 am

## 2013-08-04 NOTE — Addendum Note (Signed)
Addended by: Levi AlandSWINYER, Sabrena Gavitt M on: 08/04/2013 02:48 PM   Modules accepted: Orders

## 2013-08-04 NOTE — Progress Notes (Signed)
HPI:  80 year old gentleman who has been followed by [name]. He has a history of hypertensive heart disease and paroxysmal atrial fibrillation. He recently saw Dr. Zachery DauerBarnes and complained of symptoms that were concerning for recent stroke. An echocardiogram was ordered. He presented today for his echocardiogram and was noted to have a heart rate of 135 beats per minute. We worked him in for further evaluation and treatment of rapid atrial fibrillation. He is here with his wife today.  A few weeks ago the patient had an episode where he experienced memory problems, difficulty with gait, and there is concern about whether he may have had a TIA. His behavior was abnormal for about 12 hours. A CT scan of the brain was done and showed no acute stroke. As part of this evaluation, an echocardiogram was ordered and he presented today for this test but was found to be in atrial fibrillation with RVR.  The patient has been feeling poorly now for some time. He denies any acute changes are last few weeks. He is limited by chronic dyspnea. He also has bad knees and significant obesity. He is very sedentary. He's had no chest pain, orthopnea, or PND. He does admit to chronic leg swelling. He's been compliant with his medications. His anticoagulation is monitored through his primary physician's office. The patient specifically denies palpitations, lightheadedness, or syncope. He does feel unsteady on his feet.  Outpatient Encounter Prescriptions as of 08/04/2013  Medication Sig  . acetaminophen (TYLENOL) 500 MG tablet Take 1,000 mg by mouth every 6 (six) hours as needed. For pain  . atorvastatin (LIPITOR) 80 MG tablet Take 80 mg by mouth daily.   Marland Kitchen. diltiazem (CARDIZEM CD) 180 MG 24 hr capsule Take 180 mg by mouth daily.   . ferrous sulfate 325 (65 FE) MG tablet Take 325 mg by mouth daily with breakfast.  . gemfibrozil (LOPID) 600 MG tablet Take 600 mg by mouth 2 (two) times daily before a meal.   . glipiZIDE  (GLUCOTROL) 10 MG tablet Take 10 mg by mouth 2 (two) times daily before a meal.   . hydrochlorothiazide 25 MG tablet Take 25 mg by mouth daily.   Marland Kitchen. lisinopril (PRINIVIL,ZESTRIL) 40 MG tablet Take 40 mg by mouth daily.   . metFORMIN (GLUCOPHAGE) 500 MG tablet Take 500-1,000 mg by mouth daily. 1 in the am and 2 in the pm  . metoprolol tartrate (LOPRESSOR) 25 MG tablet Take 25 mg by mouth daily.   Marland Kitchen. warfarin (COUMADIN) 5 MG tablet Take 5-7.5 mg by mouth daily. Take 1 tablet (5 mg total) every day except on Wednesday 1.5 tablets (7.5 mg total)    Allergies  Allergen Reactions  . Aspirin Hives    Past Medical History  Diagnosis Date  . Paroxysmal atrial fibrillation     cardioversion 01/25/10  . Diabetes mellitus   . Exogenous obesity   . Hypertension   . Microcytic anemia   . Hyperlipidemia   . GERD (gastroesophageal reflux disease)   . Hiatal hernia   . Osteoarthritis    ROS: Negative except as per HPI  Pulse 139  Ht 6' (1.829 m)  Wt 262 lb 12.8 oz (119.205 kg)  BMI 35.63 kg/m2  PHYSICAL EXAM: Pt is alert and oriented, elderly obese male in NAD HEENT: normal Neck: JVP - normal, carotids 2+= without bruits Lungs: CTA bilaterally CV: Irregular and tachycardic without murmur or gallop Abd: soft, NT, Positive BS, no hepatomegaly, obese Ext: 1+ pretibial edema bilaterally,  distal pulses intact and equal Skin: warm/dry no rash  EKG:  Atrial flutter with RVR heart rate 136 beats per minute (2-1 conduction). Rightward axis  ASSESSMENT AND PLAN: Atrial flutter with RVR. The patient has known atrial fib/flutter but this is been paroxysmal in the past. He has rapid ventricular rate today. His echo interpretation is currently pending, but I have reviewed the images. His LV function appears normal. His aortic valve is thickened but mobility is reasonably well intact and he does not have any severe valvular lesions. I'm going to adjust his medications for better rate control. His Cardizem  CD will be increased to 360 mg daily and his metoprolol be increased to 50 mg twice a day. He has an appointment RV scheduled with Norma Fredrickson on January 13. If his rate remains elevated, would consider setting him up for a cardioversion at that time. We have obtained INRs from his primary physician. His INR has been therapeutic over the last several months with an INR of 3.6 on November 12, 2.5 on November 26, and 2.0 on December 23.  I reviewed his echo images today as above, and also reviewed the report of his brain CT which showed no evidence of acute infarction or bleed.  Tonny Bollman 08/04/2013 2:38 PM

## 2013-08-10 ENCOUNTER — Other Ambulatory Visit: Payer: Medicare Other

## 2013-08-10 ENCOUNTER — Ambulatory Visit
Admission: RE | Admit: 2013-08-10 | Discharge: 2013-08-10 | Disposition: A | Discharge status: Home / Self Care | Payer: Medicare Other | Source: Ambulatory Visit | Attending: Nurse Practitioner | Admitting: Nurse Practitioner

## 2013-08-10 ENCOUNTER — Ambulatory Visit (INDEPENDENT_AMBULATORY_CARE_PROVIDER_SITE_OTHER): Payer: Medicare Other | Admitting: Nurse Practitioner

## 2013-08-10 ENCOUNTER — Encounter: Payer: Self-pay | Admitting: Nurse Practitioner

## 2013-08-10 VITALS — BP 160/90 | HR 92 | Ht 72.0 in | Wt 264.8 lb

## 2013-08-10 DIAGNOSIS — I4891 Unspecified atrial fibrillation: Secondary | ICD-10-CM

## 2013-08-10 DIAGNOSIS — I10 Essential (primary) hypertension: Secondary | ICD-10-CM

## 2013-08-10 LAB — PROTIME-INR
INR: 3.2 ratio — ABNORMAL HIGH (ref 0.8–1.0)
Prothrombin Time: 32.8 s — ABNORMAL HIGH (ref 10.2–12.4)

## 2013-08-10 LAB — TSH: TSH: 1.64 u[IU]/mL (ref 0.35–5.50)

## 2013-08-10 LAB — BASIC METABOLIC PANEL
BUN: 20 mg/dL (ref 6–23)
CO2: 24 mEq/L (ref 19–32)
Calcium: 8.8 mg/dL (ref 8.4–10.5)
Chloride: 109 mEq/L (ref 96–112)
Creatinine, Ser: 1.1 mg/dL (ref 0.4–1.5)
GFR: 66.8 mL/min (ref >60.00)
Glucose, Bld: 96 mg/dL (ref 70–99)
Potassium: 4.4 mEq/L (ref 3.5–5.1)
Sodium: 140 mEq/L (ref 135–145)

## 2013-08-10 LAB — CBC
HCT: 34.6 % — ABNORMAL LOW (ref 39.0–52.0)
Hemoglobin: 11.5 g/dL — ABNORMAL LOW (ref 13.0–17.0)
MCHC: 33.1 g/dL (ref 30.0–36.0)
MCV: 89.3 fl (ref 78.0–100.0)
Platelets: 281 10*3/uL (ref 150.0–400.0)
RBC: 3.88 Mil/uL — ABNORMAL LOW (ref 4.22–5.81)
RDW: 15.7 % — ABNORMAL HIGH (ref 11.5–14.6)
WBC: 7.2 10*3/uL (ref 4.5–10.5)

## 2013-08-10 LAB — APTT: aPTT: 42.8 s — ABNORMAL HIGH (ref 21.7–28.8)

## 2013-08-10 MED ORDER — METOPROLOL TARTRATE 50 MG PO TABS: 75.0000 mg | ORAL_TABLET | Freq: Two times a day (BID) | ORAL | Status: DC

## 2013-08-10 NOTE — Progress Notes (Signed)
[name] Date of Birth: [birth year] Medical Record #[id number]  History of Present Illness: Mr. Cleckler is seen back today for a one week check. Seen for Dr. Patty Sermons. Has hypertensive heart disease, DM, obesity, HLD, GERD, OA, hiatal hernia and PAF. Several weeks ago, he had a spell where he had memory problems, difficulty with his gait and concern that he may have had a TIA. He is limited by chronic dyspnea/arthritis and obesity. Remains quite sedentary. Has had past cardioversion.   Seen last week when he was here for his echo - noted to be back in AF with RVR - meds were adjusted for better rate control. Dr. Excell Seltzer looked at his echo - this showed normal LV function, thickened AV but mobility was reasonably well intact and no severe valvular lesions. Noted that his INRs thru his PCP had been ok - INR has been therapeutic over the last several months with an INR of 3.6 on November 12, 2.5 on November 26, and 2.0 on December 23.  Comes back today. Here with his wife. Never got a recall letter to see Dr. Patty Sermons back since 2013. Still doing about the same. Limited by his breathing. More fatigued. No falls. More swelling. No chest pain. Does not really feel his heart racing/skipping/beating hard, etc.   Current Outpatient Prescriptions  Medication Sig Dispense Refill  . acetaminophen (TYLENOL) 500 MG tablet Take 1,000 mg by mouth every 6 (six) hours as needed. For pain      . atorvastatin (LIPITOR) 80 MG tablet Take 80 mg by mouth daily.       Marland Kitchen diltiazem (CARDIZEM CD) 360 MG 24 hr capsule Take 1 capsule (360 mg total) by mouth daily.  90 capsule  3  . ferrous sulfate 325 (65 FE) MG tablet Take 325 mg by mouth daily with breakfast.      . gemfibrozil (LOPID) 600 MG tablet Take 600 mg by mouth 2 (two) times daily before a meal.       . glipiZIDE (GLUCOTROL) 10 MG tablet Take 10 mg by mouth 2 (two) times daily before a meal.       . hydrochlorothiazide 25 MG tablet Take 25 mg by mouth daily.        Marland Kitchen lisinopril (PRINIVIL,ZESTRIL) 40 MG tablet Take 40 mg by mouth daily.       . metFORMIN (GLUCOPHAGE) 500 MG tablet Take 500-1,000 mg by mouth daily. 1 in the am and 2 in the pm      . metoprolol (LOPRESSOR) 50 MG tablet Take 1.5 tablets (75 mg total) by mouth 2 (two) times daily.  180 tablet  3  . warfarin (COUMADIN) 5 MG tablet Take 5-7.5 mg by mouth daily. M-W-F whole pill Tu-Th-Sat/Sun half pill       No current facility-administered medications for this visit.    Allergies  Allergen Reactions  . Aspirin Hives    Past Medical History  Diagnosis Date  . Paroxysmal atrial fibrillation     cardioversion 01/25/10  . Diabetes mellitus   . Exogenous obesity   . Hypertension   . Microcytic anemia   . Hyperlipidemia   . GERD (gastroesophageal reflux disease)   . Hiatal hernia   . Osteoarthritis     Past Surgical History  Procedure Laterality Date  . Total knee arthroplasty      B  . Cardioversion  01/25/10  . Cardiovascular stress test  2007    No ischemia. EF 61%  . US echocardiography  February 2011    Normal EF; LAE and RVE    History  Smoking status  . Former Smoker  . Quit date: 07/29/1981  Smokeless tobacco  . Not on file    History  Alcohol Use No    Family History  Problem Relation Age of Onset  . Cancer Mother     deceased  . Pneumonia Father     deceased  . ALS Brother     deceased  . Cancer Brother     deceased/bone ca  . Cancer Sister     deceased    Review of Systems: The review of systems is per the HPI.  All other systems were reviewed and are negative.  Physical Exam: BP 160/90  Pulse 92  Ht 6' (1.829 m)  Wt 264 lb 12.8 oz (120.112 kg)  BMI 35.91 kg/m2 Patient is very pleasant and in no acute distress. Skin is warm and dry. Color is normal.  HEENT is unremarkable. Normocephalic/atraumatic. PERRL. Sclera are nonicteric. Neck is supple. No masses. No JVD. Lungs are clear. Cardiac exam shows an irregular rate and rhythm. Still a  little fast. Abdomen is soft. Extremities are with 1+ edema. Gait and ROM are intact. No gross neurologic deficits noted.  Wt Readings from Last 3 Encounters:  08/10/13 264 lb 12.8 oz (120.112 kg)  08/04/13 262 lb 12.8 oz (119.205 kg)  04/13/12 278 lb (126.1 kg)     LABORATORY DATA: EKG shows atrial flutter with variable block - rate of 92.   Lab Results  Component Value Date   WBC 11.0* 04/13/2012   HGB 13.8 04/13/2012   HCT 41.0 04/13/2012   PLT 288 04/13/2012   GLUCOSE 95 04/13/2012   CHOL 154 06/06/2011   TRIG 115.0 06/06/2011   HDL 48.90 06/06/2011   LDLCALC 82 06/06/2011   ALT 9 04/13/2012   AST 16 04/13/2012   NA 139 04/13/2012   K 3.8 04/13/2012   CL 102 04/13/2012   CREATININE 0.72 04/13/2012   BUN 19 04/13/2012   CO2 25 04/13/2012   INR 1.11 09/14/2009   Echo Study Conclusions from January 2015  - Left ventricle: The cavity size was normal. There was mild concentric hypertrophy. Systolic function was normal. The estimated ejection fraction was in the range of 55% to 60%. Wall motion was normal; there were no regional wall motion abnormalities. Atrial fibrillation with rapid ventricular response precludes evaluation of LV diastolic function. - Left atrium: The atrium was severely dilated. - Right atrium: The atrium was mildly dilated. - Tricuspid valve: Mild-moderate regurgitation directed centrally. - Pulmonary arteries: Systolic pressure was moderately increased. PA peak pressure: 58mm Hg (S).   CAROTID DOPPLER IMPRESSION: 1. Mild heterogeneous atherosclerotic plaque resulting in less than 50% diameter stenosis in the right internal carotid artery. 2. No significant plaque or stenosis in the left internal carotid artery. 3. Irregular rhythm noted in both carotid arteries. Query clinical history of atrial fibrillation or other dysrhythmia? Signed,  Sterling BigHeath K. McCullough, MD   Assessment / Plan: 1. AF/flutter - will increase his Lopressor to 75 mg BID - arrange for  cardioversion next week with [name]. Procedure/risk/benefits have been reviewed and he is willing to proceed. He is scheduled for next Thursday, January 22nd at 11am. Check labs today and send for CXR today.   2. Probable recent TIA - remains on his coumadin. His CT was negative for acute infarct/bleed  3. HTN - lopressor is increased  4. Advanced age  Patient is  agreeable to this plan and will call if any problems develop in the interim.   Rosalio Macadamia, RN, ANP-C [facility] [address] [place], [place]  [address] [phone]

## 2013-08-10 NOTE — Patient Instructions (Addendum)
Stay on your current medicines but increase the Lopressor to 75 mg two times a day (this is a pill and a half twice daily)  We need to check labs today  [facility] for [facility] today - you can just walk in  We are going to arrange for a cardioversion for next Thursday - January 22nd with [name]  You are scheduled for a cardioversion on Thursday, January 22 at 11am with [name] or associates. [facility] at 9:30am.  Enter through the [facility] Do not have any food or drink after midnight on Wednesday.  You may take your medicines with a sip of water on the day of your procedure EXCEPT DO NOT TAKE YOUR DIABETIC PILLS ON THE MORNING OF YOUR PROCEDURE.  You will need someone to drive you home following your procedure.    Call the [facility] office at [phone] if you have any questions, problems or concerns.   Electrical Cardioversion Electrical cardioversion is the delivery of a jolt of electricity to change the rhythm of the heart. Sticky patches or metal paddles are placed on the chest to deliver the electricity from a device. This is done to restore a normal rhythm. A rhythm that is too fast or not regular keeps the heart from pumping well. Electrical cardioversion is done in an emergency if:   There is low or no blood pressure as a result of the heart rhythm.   Normal rhythm must be restored as fast as possible to protect the brain and heart from further damage.   It may save a life. Cardioversion may be done for heart rhythms that are not immediately life-threatening, such as atrial fibrillation or flutter, in which:   The heart is beating too fast or is not regular.   Medicine to change the rhythm has not worked.   It is safe to wait in order to allow time for preparation.  Symptoms of the abnormal rhythm are bothersome.  The risk of stroke and other  serious complications can be reduced. LET YOUR CAREGIVER KNOW ABOUT:   All medicines you are taking, including vitamins, herbs, eye drops, creams, and over-the-counter medicines.   Previous problems you or members of your family have had with the use of anesthetics.   Any blood disorders you have.   Previous surgeries you have had.   Medical conditions you have. RISKS AND COMPLICATIONS  Generally, this is a safe procedure. However, as with any procedure, complications can occur. Possible complications include:   Breathing problems related to the anesthetic used.  Cardiac arrest This risk is rare.  A blood clot that breaks free and travels to other parts of your body. This could cause a stroke or other problems. The risk of this is lowered by use of blood thinning medicine (anticoagulant) prior to the procedure. BEFORE THE PROCEDURE   You may have tests to detect blood clots in your heart and evaluate heart function.  You may start taking anticoagulants so your blood does not clot as easily.   Medicines may be given to help stabilize your heart rate and rhythm. PROCEDURE  You will be given medicine through an IV tube to reduce discomfort and make you sleepy (sedative).   An electrical shock will be delivered. AFTER THE PROCEDURE Your heart rhythm will be watched to make sure it does not change.You may be able to go  home within a few hours.  Document Released: 07/05/2002 Document Revised: 05/05/2013 Document Reviewed: 01/27/2013 [facility] Patient Information 2014 [place], [place].

## 2013-08-18 MED ORDER — SODIUM CHLORIDE 0.9 % IV SOLN
INTRAVENOUS | Status: DC
  Administered 2013-08-19: 10:00:00 via INTRAVENOUS

## 2013-08-19 ENCOUNTER — Ambulatory Visit (HOSPITAL_COMMUNITY): Payer: Medicare Other | Admitting: Anesthesiology

## 2013-08-19 ENCOUNTER — Ambulatory Visit (HOSPITAL_COMMUNITY)
Admission: RE | Admit: 2013-08-19 | Discharge: 2013-08-19 | Disposition: A | Discharge status: Home / Self Care | Payer: Medicare Other | Source: Ambulatory Visit | Attending: Cardiology | Admitting: Cardiology

## 2013-08-19 ENCOUNTER — Encounter (HOSPITAL_COMMUNITY)
Admission: RE | Disposition: A | Discharge status: Home / Self Care | Payer: Self-pay | Source: Ambulatory Visit | Attending: Cardiology

## 2013-08-19 ENCOUNTER — Encounter (HOSPITAL_COMMUNITY): Payer: Self-pay

## 2013-08-19 ENCOUNTER — Encounter (HOSPITAL_COMMUNITY): Payer: Medicare Other | Admitting: Anesthesiology

## 2013-08-19 DIAGNOSIS — E785 Hyperlipidemia, unspecified: Secondary | ICD-10-CM | POA: Insufficient documentation

## 2013-08-19 DIAGNOSIS — K449 Diaphragmatic hernia without obstruction or gangrene: Secondary | ICD-10-CM | POA: Insufficient documentation

## 2013-08-19 DIAGNOSIS — Z87891 Personal history of nicotine dependence: Secondary | ICD-10-CM | POA: Insufficient documentation

## 2013-08-19 DIAGNOSIS — E669 Obesity, unspecified: Secondary | ICD-10-CM | POA: Insufficient documentation

## 2013-08-19 DIAGNOSIS — K219 Gastro-esophageal reflux disease without esophagitis: Secondary | ICD-10-CM | POA: Insufficient documentation

## 2013-08-19 DIAGNOSIS — I4891 Unspecified atrial fibrillation: Secondary | ICD-10-CM

## 2013-08-19 DIAGNOSIS — I4892 Unspecified atrial flutter: Secondary | ICD-10-CM

## 2013-08-19 DIAGNOSIS — I10 Essential (primary) hypertension: Secondary | ICD-10-CM

## 2013-08-19 DIAGNOSIS — E119 Type 2 diabetes mellitus without complications: Secondary | ICD-10-CM | POA: Insufficient documentation

## 2013-08-19 DIAGNOSIS — M1990 Unspecified osteoarthritis, unspecified site: Secondary | ICD-10-CM | POA: Insufficient documentation

## 2013-08-19 DIAGNOSIS — I119 Hypertensive heart disease without heart failure: Secondary | ICD-10-CM | POA: Insufficient documentation

## 2013-08-19 DIAGNOSIS — M129 Arthropathy, unspecified: Secondary | ICD-10-CM | POA: Insufficient documentation

## 2013-08-19 HISTORY — PX: CARDIOVERSION: SHX1299

## 2013-08-19 LAB — BASIC METABOLIC PANEL
BUN: 23 mg/dL (ref 6–23)
CHLORIDE: 108 meq/L (ref 96–112)
CO2: 23 mEq/L (ref 19–32)
Calcium: 8.9 mg/dL (ref 8.4–10.5)
Creatinine, Ser: 0.85 mg/dL (ref 0.50–1.35)
GFR calc Af Amer: 90 mL/min — ABNORMAL LOW (ref >90)
GFR calc non Af Amer: 77 mL/min — ABNORMAL LOW (ref >90)
Glucose, Bld: 72 mg/dL (ref 70–99)
POTASSIUM: 4.7 meq/L (ref 3.7–5.3)
Sodium: 144 mEq/L (ref 137–147)

## 2013-08-19 LAB — GLUCOSE, CAPILLARY: Glucose-Capillary: 76 mg/dL (ref 70–99)

## 2013-08-19 LAB — PROTIME-INR
INR: 3.16 — ABNORMAL HIGH (ref 0.00–1.49)
Prothrombin Time: 31.3 seconds — ABNORMAL HIGH (ref 11.6–15.2)

## 2013-08-19 SURGERY — CARDIOVERSION
Anesthesia: General

## 2013-08-19 MED ORDER — LIDOCAINE HCL (CARDIAC) 20 MG/ML IV SOLN
INTRAVENOUS | Status: DC | PRN
  Administered 2013-08-19: 40 mg via INTRAVENOUS

## 2013-08-19 MED ORDER — PROPOFOL 10 MG/ML IV BOLUS
INTRAVENOUS | Status: DC | PRN
  Administered 2013-08-19: 60 mg via INTRAVENOUS

## 2013-08-19 NOTE — CV Procedure (Signed)
Electrical Cardioversion Procedure Note Omar Chan [id number] [birth year]  Procedure: Electrical Cardioversion Indications:  Atrial Flutter  Procedure Details Consent: Risks of procedure as well as the alternatives and risks of each were explained to the (patient/caregiver).  Consent for procedure obtained. Time Out: Verified patient identification, verified procedure, site/side was marked, verified correct patient position, special equipment/implants available, medications/allergies/relevent history reviewed, required imaging and test results available.  Performed  Patient placed on cardiac monitor, pulse oximetry, supplemental oxygen as necessary.  Sedation given: propofol 60 mg Pacer pads placed anterior and posterior chest.  Cardioverted 1 time(s).  Cardioverted at 120J.  Evaluation Findings: Post procedure EKG shows: NSR Complications: None Patient did tolerate procedure well.   Omar Chan 08/19/2013, 11:03 AM

## 2013-08-19 NOTE — Anesthesia Preprocedure Evaluation (Addendum)
Anesthesia Evaluation  Patient identified by MRN, date of birth, ID band Patient awake    Reviewed: Allergy & Precautions, H&P , NPO status , Patient's Chart, lab work & pertinent test results, reviewed documented beta blocker date and time   Airway Mallampati: II      Dental  (+) Teeth Intact   Pulmonary former smoker,          Cardiovascular hypertension, Pt. on home beta blockers     Neuro/Psych    GI/Hepatic   Endo/Other  diabetes, Type 2  Renal/GU      Musculoskeletal   Abdominal   Peds  Hematology   Anesthesia Other Findings   Reproductive/Obstetrics                          Anesthesia Physical Anesthesia Plan  ASA: III  Anesthesia Plan: General   Post-op Pain Management:    Induction: Intravenous  Airway Management Planned: Mask  Additional Equipment:   Intra-op Plan:   Post-operative Plan:   Informed Consent: I have reviewed the patients History and Physical, chart, labs and discussed the procedure including the risks, benefits and alternatives for the proposed anesthesia with the patient or authorized representative who has indicated his/her understanding and acceptance.   Dental advisory given  Plan Discussed with: CRNA and Anesthesiologist  Anesthesia Plan Comments:         Anesthesia Quick Evaluation

## 2013-08-19 NOTE — Discharge Instructions (Signed)
Monitored Anesthesia Care  °Monitored anesthesia care is an anesthesia service for a medical procedure. Anesthesia is the loss of the ability to feel pain. It is produced by medications called anesthetics. It may affect a small area of your body (local anesthesia), a large area of your body (regional anesthesia), or your entire body (general anesthesia). The need for monitored anesthesia care depends your procedure, your condition, and the potential need for regional or general anesthesia. It is often provided during procedures where:  °· General anesthesia may be needed if there are complications. This is because you need special care when you are under general anesthesia.   °· You will be under local or regional anesthesia. This is so that you are able to have higher levels of anesthesia if needed.   °· You will receive calming medications (sedatives). This is especially the case if sedatives are given to put you in a semi-conscious state of relaxation (deep sedation). This is because the amount of sedative needed to produce this state can be hard to predict. Too much of a sedative can produce general anesthesia. °Monitored anesthesia care is performed by one or more caregivers who have special training in all types of anesthesia. You will need to meet with these caregivers before your procedure. During this meeting, they will ask you about your medical history. They will also give you instructions to follow. (For example, you will need to stop eating and drinking before your procedure. You may also need to stop or change medications you are taking.) During your procedure, your caregivers will stay with you. They will:  °· Watch your condition. This includes watching you blood pressure, breathing, and level of pain.   °· Diagnose and treat problems that occur.   °· Give medications if they are needed. These may include calming medications (sedatives) and anesthetics.   °· Make sure you are comfortable.   °Having  monitored anesthesia care does not necessarily mean that you will be under anesthesia. It does mean that your caregivers will be able to manage anesthesia if you need it or if it occurs. It also means that you will be able to have a different type of anesthesia than you are having if you need it. When your procedure is complete, your caregivers will continue to watch your condition. They will make sure any medications wear off before you are allowed to go home.  °Document Released: 04/10/2005 Document Revised: 11/09/2012 Document Reviewed: 08/26/2012 °ExitCare® Patient Information ©2014 [employer]. °Electrical Cardioversion °Electrical cardioversion is the delivery of a jolt of electricity to change the rhythm of the heart. Sticky patches or metal paddles are placed on the chest to deliver the electricity from a device. This is done to restore a normal rhythm. A rhythm that is too fast or not regular keeps the heart from pumping well. °Electrical cardioversion is done in an emergency if:  °· There is low or no blood pressure as a result of the heart rhythm.   °· Normal rhythm must be restored as fast as possible to protect the brain and heart from further damage.   °· It may save a life. °Cardioversion may be done for heart rhythms that are not immediately life-threatening, such as atrial fibrillation or flutter, in which:  °· The heart is beating too fast or is not regular.   °· Medicine to change the rhythm has not worked.   °· It is safe to wait in order to allow time for preparation. °· Symptoms of the abnormal rhythm are bothersome. °· The risk of   stroke and other serious complications can be reduced. °LET YOUR CAREGIVER KNOW ABOUT:  °· All medicines you are taking, including vitamins, herbs, eye drops, creams, and over-the-counter medicines.   °· Previous problems you or members of your family have had with the use of anesthetics.   °· Any blood disorders you have.   °· Previous surgeries you have had.    °· Medical conditions you have. °RISKS AND COMPLICATIONS  °Generally, this is a safe procedure. However, as with any procedure, complications can occur. Possible complications include:  °· Breathing problems related to the anesthetic used. °· Cardiac arrest This risk is rare. °· A blood clot that breaks free and travels to other parts of your body. This could cause a stroke or other problems. The risk of this is lowered by use of blood thinning medicine (anticoagulant) prior to the procedure. °BEFORE THE PROCEDURE  °· You may have tests to detect blood clots in your heart and evaluate heart function.  °· You may start taking anticoagulants so your blood does not clot as easily.   °· Medicines may be given to help stabilize your heart rate and rhythm. °PROCEDURE °· You will be given medicine through an IV tube to reduce discomfort and make you sleepy (sedative).   °· An electrical shock will be delivered. °AFTER THE PROCEDURE °Your heart rhythm will be watched to make sure it does not change. You may be able to go home within a few hours.  °Document Released: 07/05/2002 Document Revised: 05/05/2013 Document Reviewed: 01/27/2013 °ExitCare® Patient Information ©2014 [employer]. ° °

## 2013-08-19 NOTE — Anesthesia Postprocedure Evaluation (Signed)
  Anesthesia Post-op Note  Patient: Omar Chan  Procedure(s) Performed: Procedure(s): CARDIOVERSION (N/A)  Patient Location: Endoscopy Unit  Anesthesia Type:General  Level of Consciousness: awake, alert  and oriented  Airway and Oxygen Therapy: Patient Spontanous Breathing and Patient connected to nasal cannula oxygen  Post-op Pain: none  Post-op Assessment: Post-op Vital signs reviewed, Patient's Cardiovascular Status Stable, Respiratory Function Stable and Patent Airway  Post-op Vital Signs: Reviewed and stable  Complications: No apparent anesthesia complications

## 2013-08-19 NOTE — Transfer of Care (Signed)
Immediate Anesthesia Transfer of Care Note  Patient: Omar Chan  Procedure(s) Performed: Procedure(s): CARDIOVERSION (N/A)  Patient Location: Endoscopy Unit  Anesthesia Type:General  Level of Consciousness: awake, alert  and oriented  Airway & Oxygen Therapy: Patient Spontanous Breathing and Patient connected to nasal cannula oxygen  Post-op Assessment: Post -op Vital signs reviewed and stable and Patient moving all extremities X 4  Post vital signs: Reviewed and stable  Complications: No apparent anesthesia complications

## 2013-08-19 NOTE — Anesthesia Procedure Notes (Signed)
Date/Time: 08/19/2013 11:00 AM Performed by: Quentin OreWALKER, Hibah Odonnell E Pre-anesthesia Checklist: Patient identified, Emergency Drugs available, Suction available, Patient being monitored and Timeout performed Oxygen Delivery Method: Ambu bag Preoxygenation: Pre-oxygenation with 100% oxygen Intubation Type: IV induction

## 2013-08-19 NOTE — H&P (Signed)
Patient returns for elective DCCV. See recent office note:  Mr. Omar Chan is seen back today for a one week check. Seen for [name]. Has hypertensive heart disease, DM, obesity, HLD, GERD, OA, hiatal hernia and PAF. Several weeks ago, he had a spell where he had memory problems, difficulty with his gait and concern that he may have had a TIA. He is limited by chronic dyspnea/arthritis and obesity. Remains quite sedentary. Has had past cardioversion.  Seen last week when he was here for his echo - noted to be back in AF with RVR - meds were adjusted for better rate control. Dr. Excell Seltzerooper looked at his echo - this showed normal LV function, thickened AV but mobility was reasonably well intact and no severe valvular lesions. Noted that his INRs thru his PCP had been ok - INR has been therapeutic over the last several months with an INR of 3.6 on November 12, 2.5 on November 26, and 2.0 on December 23. Last INR 3.2 on 08/10/13. Echo on 08/04/13: - Left ventricle: The cavity size was normal. There was mild concentric hypertrophy. Systolic function was normal. The estimated ejection fraction was in the range of 55% to 60%. Wall motion was normal; there were no regional wall motion abnormalities. Atrial fibrillation with rapid ventricular response precludes evaluation of LV diastolic function. - Left atrium: The atrium was severely dilated. - Right atrium: The atrium was mildly dilated. - Tricuspid valve: Mild-moderate regurgitation directed centrally. - Pulmonary arteries: Systolic pressure was moderately increased. PA peak pressure: 58mm Hg (S).  Plan: DCCV for atrial flutter today.

## 2013-08-19 NOTE — Preoperative (Signed)
Beta Blockers   Reason not to administer Beta Blockers:BB taken on 08/18/13 at 2100.

## 2013-08-20 ENCOUNTER — Encounter (HOSPITAL_COMMUNITY): Payer: Self-pay | Admitting: Cardiology

## 2013-08-26 ENCOUNTER — Other Ambulatory Visit: Payer: Self-pay

## 2013-08-26 DIAGNOSIS — I10 Essential (primary) hypertension: Secondary | ICD-10-CM

## 2013-08-26 DIAGNOSIS — I4891 Unspecified atrial fibrillation: Secondary | ICD-10-CM

## 2013-08-26 MED ORDER — METOPROLOL TARTRATE 50 MG PO TABS: 75.0000 mg | ORAL_TABLET | Freq: Two times a day (BID) | ORAL | Status: DC

## 2013-08-26 MED ORDER — DILTIAZEM HCL ER COATED BEADS 360 MG PO CP24: 360.0000 mg | ORAL_CAPSULE | Freq: Every day | ORAL | Status: DC

## 2013-09-07 ENCOUNTER — Encounter: Payer: Self-pay | Admitting: Nurse Practitioner

## 2013-09-07 ENCOUNTER — Ambulatory Visit (INDEPENDENT_AMBULATORY_CARE_PROVIDER_SITE_OTHER): Payer: Medicare Other | Admitting: Nurse Practitioner

## 2013-09-07 ENCOUNTER — Other Ambulatory Visit: Payer: Self-pay | Admitting: *Deleted

## 2013-09-07 VITALS — BP 140/80 | HR 55 | Ht 72.0 in | Wt 258.0 lb

## 2013-09-07 DIAGNOSIS — I4891 Unspecified atrial fibrillation: Secondary | ICD-10-CM

## 2013-09-07 DIAGNOSIS — I10 Essential (primary) hypertension: Secondary | ICD-10-CM

## 2013-09-07 MED ORDER — METOPROLOL TARTRATE 50 MG PO TABS: 75.0000 mg | ORAL_TABLET | Freq: Two times a day (BID) | ORAL | Status: DC

## 2013-09-07 NOTE — Patient Instructions (Signed)
Stay on your current medicines  See Dr. Clarene DukeLittle as planned - talk to him about your oxygen level and the need for oxygen  See [name] in 3 months  Call the [facility] office at [phone] if you have any questions, problems or concerns.

## 2013-09-07 NOTE — Progress Notes (Signed)
Omar Chan Date of Birth: 12/05/1925 Medical Record #[id number]  History of Present Illness: Omar Chan is seen back today for a post cardioversion visit. Seen for Omar Chan. Has hypertensive heart disease, DM, obesity, HLD, GERD, OA, hiatal hernia and PAF. Has had past spell in December of 2014 where he had a spell where he had memory problems, difficulty with his gait and concern that he may have had a TIA. He is limited by chronic dyspnea/arthritis and obesity. Remains quite sedentary. Has had past cardioversion.   Seen back in January when he was here for his echo (done because of the possible TIA) - noted to be back in AF with RVR - meds were adjusted for better rate control. Omar Chan looked at his echo - this showed normal LV function, thickened AV but mobility was reasonably well intact and no severe valvular lesions. Noted that his INRs thru his PCP had been ok - INR has been therapeutic over the last several months with an INR of 3.6 on November 12, 2.5 on November 26, and 2.0 on December 23.   I saw him about 3 weeks ago - Lopressor was increased for his BP and we arranged for cardioversion. This was successful.   Comes back today. Here with his wife. He is doing ok. Does not really feel any different now that he is back in sinus. No falls. Still a little wobbly. Wife notes that he is hallucinating at night - thinking someone else is in the bed with him. Little dizzy at times and wobbly but no falls. To see Omar Chan later this week. No chest pain.   Current Outpatient Prescriptions  Medication Sig Dispense Refill  . acetaminophen (TYLENOL) 500 MG tablet Take 1,000 mg by mouth every 6 (six) hours as needed. For pain      . atorvastatin (LIPITOR) 80 MG tablet Take 80 mg by mouth daily.       . cholestyramine (QUESTRAN) 4 G packet Place 4 g into feeding tube every other day.       . diltiazem (CARDIZEM CD) 360 MG 24 hr capsule Take 1 capsule (360 mg total) by mouth daily.  90  capsule  3  . ferrous sulfate 325 (65 FE) MG tablet Take 325 mg by mouth daily with breakfast.      . gemfibrozil (LOPID) 600 MG tablet Take 600 mg by mouth 2 (two) times daily before a meal.       . glipiZIDE (GLUCOTROL) 10 MG tablet Take 10 mg by mouth 2 (two) times daily before a meal.       . hydrochlorothiazide 25 MG tablet Take 25 mg by mouth daily.       Marland Kitchen lisinopril (PRINIVIL,ZESTRIL) 40 MG tablet Take 40 mg by mouth daily.       . metFORMIN (GLUCOPHAGE) 500 MG tablet Take 500-1,000 mg by mouth daily. 1 in the am and 2 in the pm      . metoprolol (LOPRESSOR) 50 MG tablet Take 1.5 tablets (75 mg total) by mouth 2 (two) times daily.  270 tablet  3  . warfarin (COUMADIN) 5 MG tablet Take 5-7.5 mg by mouth daily. M-W-F whole pill Tu-Th-Sat/Sun half pill       No current facility-administered medications for this visit.    Allergies  Allergen Reactions  . Aspirin Hives    Past Medical History  Diagnosis Date  . Paroxysmal atrial fibrillation     cardioversion 01/25/10  . Diabetes mellitus   .  Exogenous obesity   . Hypertension   . Microcytic anemia   . Hyperlipidemia   . GERD (gastroesophageal reflux disease)   . Hiatal hernia   . Osteoarthritis     Past Surgical History  Procedure Laterality Date  . Total knee arthroplasty      B  . Cardioversion  01/25/10  . Cardiovascular stress test  2007    No ischemia. EF 61%  . US echocardiography  February 2011    Normal EF; LAE and RVE  . Cardioversion N/A 08/19/2013    Procedure: CARDIOVERSION;  Surgeon: Omar Clement, MD;  Location: [facility] ENDOSCOPY;  Service: Cardiovascular;  Laterality: N/A;    History  Smoking status  . Former Smoker  . Quit date: 07/29/1981  Smokeless tobacco  . Not on file    History  Alcohol Use No    Family History  Problem Relation Age of Onset  . Cancer Mother     deceased  . Pneumonia Father     deceased  . ALS Brother     deceased  . Cancer Brother     deceased/bone ca  . Cancer  Sister     deceased    Review of Systems: The review of systems is per the HPI.  All other systems were reviewed and are negative.  Physical Exam: BP 140/80  Pulse 55  Ht 6' (1.829 m)  Wt 258 lb (117.028 kg)  BMI 34.98 kg/m2  SpO2 86% Patient is very pleasant elderly male who is in no acute distress. Skin is warm and dry. Color is normal.  HEENT is unremarkable. Normocephalic/atraumatic. PERRL. Sclera are nonicteric. Neck is supple. No masses. No JVD. Lungs are clear. Cardiac exam shows a regular rate and rhythm. Abdomen is soft. Extremities are without edema. Gait and ROM are intact. No gross neurologic deficits noted.  LABORATORY DATA: EKG today shows sinus bradycardia. Reviewed with Omar Chan.   Lab Results  Component Value Date   WBC 7.2 08/10/2013   HGB 11.5* 08/10/2013   HCT 34.6* 08/10/2013   PLT 281.0 08/10/2013   GLUCOSE 72 08/19/2013   CHOL 154 06/06/2011   TRIG 115.0 06/06/2011   HDL 48.90 06/06/2011   LDLCALC 82 06/06/2011   ALT 9 04/13/2012   AST 16 04/13/2012   NA 144 08/19/2013   K 4.7 08/19/2013   CL 108 08/19/2013   CREATININE 0.85 08/19/2013   BUN 23 08/19/2013   CO2 23 08/19/2013   TSH 1.64 08/10/2013   INR 3.16* 08/19/2013   Procedure: Electrical Cardioversion  Indications: Atrial Flutter  Procedure Details  Consent: Risks of procedure as well as the alternatives and risks of each were explained to the (patient/caregiver). Consent for procedure obtained.  Time Out: Verified patient identification, verified procedure, site/side was marked, verified correct patient position, special equipment/implants available, medications/allergies/relevent history reviewed, required imaging and test results available. Performed  Patient placed on cardiac monitor, pulse oximetry, supplemental oxygen as necessary.  Sedation given: propofol 60 mg  Pacer pads placed anterior and posterior chest.  Cardioverted 1 time(s).  Cardioverted at 120J.  Evaluation  Findings: Post procedure  EKG shows: NSR  Complications: None  Patient did tolerate procedure well.  Omar Chan  08/19/2013, 11:03 AM    Assessment / Plan: 1. PAF/flutter - s/p cardioversion - remains in sinus.   2. Chronic coumadin - no falls but remains at high risk.   3. HTN - BP has improved.   4. Recent TIA  5. Advanced age  44.  Hypoxia - some shortness of breath - qualifies for oxygen - does not wish to start but says he will talk with his PCP.   7. Hallucinations - probable dementia - defer to his PCP  See him back in 3 months.   Patient is agreeable to this plan and will call if any problems develop in the interim.   Rosalio MacadamiaLori C. Kamalei Roeder, RN, ANP-C [facility] [address] [place], [place]  [address] [phone]   Patient is agreeable to this plan and will call if any problems develop in the interim.   Rosalio MacadamiaLori C. Graciano Batson, RN, ANP-C [facility] [address] [place], [place]  [id number] [phone]

## 2013-09-12 ENCOUNTER — Encounter (HOSPITAL_COMMUNITY): Payer: Self-pay | Admitting: Emergency Medicine

## 2013-09-12 ENCOUNTER — Observation Stay (HOSPITAL_COMMUNITY)
Admission: EM | Admit: 2013-09-12 | Discharge: 2013-09-14 | Disposition: A | Discharge status: Home / Self Care | Payer: Medicare Other | Attending: Internal Medicine | Admitting: Internal Medicine

## 2013-09-12 ENCOUNTER — Emergency Department (HOSPITAL_COMMUNITY): Payer: Medicare Other

## 2013-09-12 DIAGNOSIS — E119 Type 2 diabetes mellitus without complications: Secondary | ICD-10-CM

## 2013-09-12 DIAGNOSIS — K219 Gastro-esophageal reflux disease without esophagitis: Secondary | ICD-10-CM | POA: Insufficient documentation

## 2013-09-12 DIAGNOSIS — Z7901 Long term (current) use of anticoagulants: Secondary | ICD-10-CM | POA: Insufficient documentation

## 2013-09-12 DIAGNOSIS — I119 Hypertensive heart disease without heart failure: Secondary | ICD-10-CM | POA: Insufficient documentation

## 2013-09-12 DIAGNOSIS — R55 Syncope and collapse: Secondary | ICD-10-CM | POA: Insufficient documentation

## 2013-09-12 DIAGNOSIS — Z87891 Personal history of nicotine dependence: Secondary | ICD-10-CM | POA: Insufficient documentation

## 2013-09-12 DIAGNOSIS — E6609 Other obesity due to excess calories: Secondary | ICD-10-CM

## 2013-09-12 DIAGNOSIS — R609 Edema, unspecified: Secondary | ICD-10-CM

## 2013-09-12 DIAGNOSIS — Z8673 Personal history of transient ischemic attack (TIA), and cerebral infarction without residual deficits: Secondary | ICD-10-CM | POA: Insufficient documentation

## 2013-09-12 DIAGNOSIS — E785 Hyperlipidemia, unspecified: Secondary | ICD-10-CM | POA: Insufficient documentation

## 2013-09-12 DIAGNOSIS — M1990 Unspecified osteoarthritis, unspecified site: Secondary | ICD-10-CM | POA: Insufficient documentation

## 2013-09-12 DIAGNOSIS — I498 Other specified cardiac arrhythmias: Principal | ICD-10-CM | POA: Insufficient documentation

## 2013-09-12 DIAGNOSIS — E669 Obesity, unspecified: Secondary | ICD-10-CM | POA: Insufficient documentation

## 2013-09-12 DIAGNOSIS — I4891 Unspecified atrial fibrillation: Secondary | ICD-10-CM | POA: Insufficient documentation

## 2013-09-12 DIAGNOSIS — I10 Essential (primary) hypertension: Secondary | ICD-10-CM

## 2013-09-12 DIAGNOSIS — R001 Bradycardia, unspecified: Secondary | ICD-10-CM | POA: Diagnosis present

## 2013-09-12 LAB — URINALYSIS, ROUTINE W REFLEX MICROSCOPIC
Bilirubin Urine: NEGATIVE
Glucose, UA: NEGATIVE mg/dL
HGB URINE DIPSTICK: NEGATIVE
Ketones, ur: NEGATIVE mg/dL
Leukocytes, UA: NEGATIVE
NITRITE: NEGATIVE
PH: 6 (ref 5.0–8.0)
Protein, ur: NEGATIVE mg/dL
SPECIFIC GRAVITY, URINE: 1.016 (ref 1.005–1.030)
Urobilinogen, UA: 0.2 mg/dL (ref 0.0–1.0)

## 2013-09-12 LAB — GLUCOSE, CAPILLARY: Glucose-Capillary: 105 mg/dL — ABNORMAL HIGH (ref 70–99)

## 2013-09-12 LAB — CBC WITH DIFFERENTIAL/PLATELET
Basophils Absolute: 0 10*3/uL (ref 0.0–0.1)
Basophils Relative: 0 % (ref 0–1)
EOS ABS: 0.1 10*3/uL (ref 0.0–0.7)
Eosinophils Relative: 2 % (ref 0–5)
HCT: 35.2 % — ABNORMAL LOW (ref 39.0–52.0)
HEMOGLOBIN: 11.4 g/dL — AB (ref 13.0–17.0)
LYMPHS ABS: 1.1 10*3/uL (ref 0.7–4.0)
LYMPHS PCT: 16 % (ref 12–46)
MCH: 29.7 pg (ref 26.0–34.0)
MCHC: 32.4 g/dL (ref 30.0–36.0)
MCV: 91.7 fL (ref 78.0–100.0)
MONO ABS: 0.5 10*3/uL (ref 0.1–1.0)
MONOS PCT: 7 % (ref 3–12)
Neutro Abs: 5.4 10*3/uL (ref 1.7–7.7)
Neutrophils Relative %: 76 % (ref 43–77)
Platelets: 292 10*3/uL (ref 150–400)
RBC: 3.84 MIL/uL — ABNORMAL LOW (ref 4.22–5.81)
RDW: 14.6 % (ref 11.5–15.5)
WBC: 7.2 10*3/uL (ref 4.0–10.5)

## 2013-09-12 LAB — BASIC METABOLIC PANEL
BUN: 25 mg/dL — ABNORMAL HIGH (ref 6–23)
CALCIUM: 8.8 mg/dL (ref 8.4–10.5)
CO2: 22 meq/L (ref 19–32)
CREATININE: 1.07 mg/dL (ref 0.50–1.35)
Chloride: 108 mEq/L (ref 96–112)
GFR calc Af Amer: 71 mL/min — ABNORMAL LOW (ref >90)
GFR, EST NON AFRICAN AMERICAN: 61 mL/min — AB (ref >90)
GLUCOSE: 113 mg/dL — AB (ref 70–99)
Potassium: 4.6 mEq/L (ref 3.7–5.3)
SODIUM: 141 meq/L (ref 137–147)

## 2013-09-12 LAB — PROTIME-INR
INR: 3.14 — AB (ref 0.00–1.49)
Prothrombin Time: 31.1 seconds — ABNORMAL HIGH (ref 11.6–15.2)

## 2013-09-12 LAB — MRSA PCR SCREENING: MRSA by PCR: NEGATIVE

## 2013-09-12 LAB — TROPONIN I: Troponin I: <0.3 ng/mL (ref <0.30)

## 2013-09-12 MED ORDER — METOPROLOL TARTRATE 25 MG PO TABS: 25.0000 mg | ORAL_TABLET | Freq: Once | ORAL | Status: DC

## 2013-09-12 MED ORDER — WARFARIN SODIUM 2.5 MG PO TABS: 2.5000 mg | ORAL_TABLET | Freq: Every morning | ORAL | Status: DC

## 2013-09-12 MED ORDER — METFORMIN HCL 500 MG PO TABS
1000.0000 mg | ORAL_TABLET | Freq: Every day | ORAL | Status: DC
  Administered 2013-09-12 – 2013-09-13 (×2): 1000 mg via ORAL
  Filled 2013-09-12 (×3): qty 2

## 2013-09-12 MED ORDER — LISINOPRIL 40 MG PO TABS
40.0000 mg | ORAL_TABLET | Freq: Every morning | ORAL | Status: DC
Start: 2013-09-13 — End: 2013-09-14
  Administered 2013-09-13 – 2013-09-14 (×2): 40 mg via ORAL
  Filled 2013-09-12 (×2): qty 1

## 2013-09-12 MED ORDER — METFORMIN HCL 500 MG PO TABS
500.0000 mg | ORAL_TABLET | Freq: Every day | ORAL | Status: DC
  Administered 2013-09-13 – 2013-09-14 (×2): 500 mg via ORAL
  Filled 2013-09-12 (×3): qty 1

## 2013-09-12 MED ORDER — TRIAMTERENE-HCTZ 75-50 MG PO TABS
1.0000 | ORAL_TABLET | Freq: Every morning | ORAL | Status: DC
  Administered 2013-09-13 – 2013-09-14 (×2): 1 via ORAL
  Filled 2013-09-12 (×2): qty 1

## 2013-09-12 MED ORDER — METFORMIN HCL 500 MG PO TABS: 500.0000 mg | ORAL_TABLET | Freq: Every day | ORAL | Status: DC

## 2013-09-12 MED ORDER — ACETAMINOPHEN 500 MG PO TABS: 1000.0000 mg | ORAL_TABLET | Freq: Four times a day (QID) | ORAL | Status: DC | PRN

## 2013-09-12 MED ORDER — GLIPIZIDE ER 10 MG PO TB24
10.0000 mg | ORAL_TABLET | Freq: Two times a day (BID) | ORAL | Status: DC
  Administered 2013-09-12 – 2013-09-14 (×4): 10 mg via ORAL
  Filled 2013-09-12 (×5): qty 1

## 2013-09-12 MED ORDER — WARFARIN - PHARMACIST DOSING INPATIENT: Freq: Every day | Status: DC

## 2013-09-12 MED ORDER — SODIUM CHLORIDE 0.9 % IJ SOLN
3.0000 mL | Freq: Two times a day (BID) | INTRAMUSCULAR | Status: DC
  Administered 2013-09-12 – 2013-09-14 (×4): 3 mL via INTRAVENOUS

## 2013-09-12 MED ORDER — ATORVASTATIN CALCIUM 80 MG PO TABS
80.0000 mg | ORAL_TABLET | Freq: Every morning | ORAL | Status: DC
  Administered 2013-09-13 – 2013-09-14 (×2): 80 mg via ORAL
  Filled 2013-09-12 (×2): qty 1

## 2013-09-12 MED ORDER — CHOLESTYRAMINE 4 G PO PACK
4.0000 g | PACK | Freq: Every morning | ORAL | Status: DC
  Filled 2013-09-12: qty 1

## 2013-09-12 NOTE — Progress Notes (Addendum)
Patient arrived to unit at approximately 1630.  MD at bedside. Pt HR ranging in 40's, pt asymptomatic.  Pt is alert and oriented.  No orders were given at this time in regards to pt HR.  Nurse requested minimum parameters for HR interventions, no parameters were communicated at this time.Nurse will continue to monitor.

## 2013-09-12 NOTE — ED Provider Notes (Signed)
CSN: [id number]     Arrival date & time 09/12/13  1351 History   First MD Initiated Contact with Patient 09/12/13 1410     Chief Complaint  Patient presents with  . Dizziness  . Bradycardia      HPI  Patient presents with his family. Has history of paroxysmal atrial fibrillation. He underwent DC synchronous cardioversion with Dr. Vedia Coffer male on January 26. He had increase of his doses of his beta blocker and calcium at that time since that time has had intermittent episodes where he has been lightheaded but occasionally confused him or syncopal. This happened last night. It recurred again today. Patient's wife with whom he lives called his daughter. She was at church across the street. She  states that he was weak and dizzy and she called 911. Paramedics with a heart rate of 35. He was transferred here with sinus bradycardia awake and alert en route.  He has not been dyspneic. He has had easy fatigue. No chest pain. No frank syncope or falls.  Past Medical History  Diagnosis Date  . Paroxysmal atrial fibrillation     cardioversion 01/25/10  . Diabetes mellitus   . Exogenous obesity   . Hypertension   . Microcytic anemia   . Hyperlipidemia   . GERD (gastroesophageal reflux disease)   . Hiatal hernia   . Osteoarthritis    Past Surgical History  Procedure Laterality Date  . Total knee arthroplasty      B  . Cardioversion  01/25/10  . Cardiovascular stress test  2007    No ischemia. EF 61%  . US echocardiography  February 2011    Normal EF; LAE and RVE  . Cardioversion N/A 08/19/2013    Procedure: CARDIOVERSION;  Surgeon: Cassell Clement, MD;  Location: [facility] ENDOSCOPY;  Service: Cardiovascular;  Laterality: N/A;   Family History  Problem Relation Age of Onset  . Cancer Mother     deceased  . Pneumonia Father     deceased  . ALS Brother     deceased  . Cancer Brother     deceased/bone ca  . Cancer Sister     deceased   History  Substance Use Topics  . Smoking status:  Former Smoker    Quit date: 07/29/1981  . Smokeless tobacco: Not on file  . Alcohol Use: No    Review of Systems  Constitutional: Negative for fever, chills, diaphoresis, appetite change and fatigue.  HENT: Negative for mouth sores, sore throat and trouble swallowing.   Eyes: Negative for visual disturbance.  Respiratory: Negative for cough, chest tightness, shortness of breath and wheezing.   Cardiovascular: Negative for chest pain.  Gastrointestinal: Negative for nausea, vomiting, abdominal pain, diarrhea and abdominal distention.  Endocrine: Negative for polydipsia, polyphagia and polyuria.  Genitourinary: Negative for dysuria, frequency and hematuria.  Musculoskeletal: Negative for gait problem.  Skin: Negative for color change, pallor and rash.  Neurological: Positive for dizziness and syncope. Negative for light-headedness and headaches.  Hematological: Does not bruise/bleed easily.  Psychiatric/Behavioral: Negative for behavioral problems and confusion.      Allergies  Aspirin  Home Medications   Current Outpatient Rx  Name  Route  Sig  Dispense  Refill  . acetaminophen (TYLENOL) 500 MG tablet   Oral   Take 1,000 mg by mouth every 6 (six) hours as needed. For pain         . atorvastatin (LIPITOR) 80 MG tablet   Oral   Take 80 mg by mouth  daily.          . cholestyramine (QUESTRAN) 4 G packet   Per Tube   Place 4 g into feeding tube every other day.          . diltiazem (CARDIZEM CD) 360 MG 24 hr capsule   Oral   Take 1 capsule (360 mg total) by mouth daily.   90 capsule   3   . ferrous sulfate 325 (65 FE) MG tablet   Oral   Take 325 mg by mouth daily with breakfast.         . gemfibrozil (LOPID) 600 MG tablet   Oral   Take 600 mg by mouth 2 (two) times daily before a meal.          . glipiZIDE (GLUCOTROL) 10 MG tablet   Oral   Take 10 mg by mouth 2 (two) times daily before a meal.          . hydrochlorothiazide 25 MG tablet   Oral    Take 25 mg by mouth daily.          Marland Kitchen lisinopril (PRINIVIL,ZESTRIL) 40 MG tablet   Oral   Take 40 mg by mouth daily.          . metFORMIN (GLUCOPHAGE) 500 MG tablet   Oral   Take 500-1,000 mg by mouth daily. 1 in the am and 2 in the pm         . metoprolol (LOPRESSOR) 50 MG tablet   Oral   Take 1.5 tablets (75 mg total) by mouth 2 (two) times daily.   270 tablet   3   . warfarin (COUMADIN) 5 MG tablet   Oral   Take 5-7.5 mg by mouth daily. M-W-F whole pill Tu-Th-Sat/Sun half pill          BP 127/58  Pulse 36  Temp(Src) 97.4 F (36.3 C) (Oral)  Resp 14  Ht 6' (1.829 m)  Wt 260 lb (117.935 kg)  BMI 35.25 kg/m2  SpO2 99% Physical Exam  Constitutional: He is oriented to person, place, and time. He appears well-developed and well-nourished. No distress.  HENT:  Head: Normocephalic.  Eyes: Conjunctivae are normal. Pupils are equal, round, and reactive to light. No scleral icterus.  Neck: Normal range of motion. Neck supple. No thyromegaly present.  Cardiovascular: Regular rhythm.  Bradycardia present.  Exam reveals no gallop and no friction rub.   No murmur heard. Sinus bradycardia on monitor  Pulmonary/Chest: Effort normal and breath sounds normal. No respiratory distress. He has no wheezes. He has no rales.  Abdominal: Soft. Bowel sounds are normal. He exhibits no distension. There is no tenderness. There is no rebound.  Musculoskeletal: Normal range of motion.  Neurological: He is alert and oriented to person, place, and time.  Skin: Skin is warm and dry. No rash noted.  Psychiatric: He has a normal mood and affect. His behavior is normal.    ED Course  Procedures (including critical care time) Labs Review Labs Reviewed  CBC WITH DIFFERENTIAL - Abnormal; Notable for the following:    RBC 3.84 (*)    Hemoglobin 11.4 (*)    HCT 35.2 (*)    All other components within normal limits  GLUCOSE, CAPILLARY - Abnormal; Notable for the following:     Glucose-Capillary 105 (*)    All other components within normal limits  URINE CULTURE  BASIC METABOLIC PANEL  PROTIME-INR  TROPONIN I  URINALYSIS, ROUTINE W REFLEX MICROSCOPIC  Imaging Review Ct Head Wo Contrast  09/12/2013   CLINICAL DATA:  Dizziness, headache.  EXAM: CT HEAD WITHOUT CONTRAST  TECHNIQUE: Contiguous axial images were obtained from the base of the skull through the vertex without intravenous contrast.  COMPARISON:  CT scan of July 26, 2013.  FINDINGS: Bony calvarium appears intact. Mild diffuse cortical atrophy is noted. Mild chronic ischemic white matter disease is noted. No mass effect or midline shift is noted. Ventricular size is within normal limits. There is no evidence of mass lesion, hemorrhage or acute infarction.  IMPRESSION: Mild diffuse cortical atrophy. Mild chronic ischemic white matter disease. No acute intracranial abnormality seen.   Electronically Signed   By: Roque LiasJames  Green M.D.   On: 09/12/2013 14:53    EKG Interpretation   None       MDM   Final diagnoses:  Bradycardia   Patient remained bradycardic. However he shows no signs of congestive heart failure. Is not hypotensive is awake alert and mentating well. I discussed the case with Dr.Kosevwarren.  Patient admitted. I think his bradycardia is very likely to medication effects and these will be held.     Rolland PorterMark Odelle Kosier, MD 09/12/13 [id number]

## 2013-09-12 NOTE — ED Notes (Signed)
Patient is alert and oriented upon arrival to ED.  Family reports they were talking to him today and he became less oriented.  cbg was low,  Patient was fed and is now back to baseline.  Patient family also report periods of shakiness.  Patient also reported to have periods of aggitation.  Patient with recent cardioversion for afib and increase in meds.  Sx correlate with increase in meds.  ermd has been to bedside and aware of critical labs

## 2013-09-12 NOTE — ED Notes (Signed)
Patient remains alert and oriented.  Patient with heart rate in the 30's.  Patient tolerated ct well.  Patient has noted edema to bil lower extremities.  No chest pain.  Pulse ox 98% on 2 liter/min

## 2013-09-12 NOTE — Plan of Care (Signed)
Problem: Phase II Progression Outcomes Goal: Vital signs remain stable Outcome: Not Progressing Pt remains bradycardic.  MD at bedside

## 2013-09-12 NOTE — ED Notes (Signed)
Patient with 3 episodes of dizziness and confusion.  Patient also reported to become aggitated.  Today patient had episode again,  cbg was 1342 by family.  Patient did eat and cbg is now 131.  Patient found to have heart rate of 30's on monitor.  Patient remains alert.  Initial pulse ox was 94 percent on room air.  Patient denies any pain.  He is sob with activity.  New dx of copd.  Patient also has hx of afib.  Patient is also diabetic.  Patient with recent increase motoprolol and cardiazem.  bp 95/50.

## 2013-09-12 NOTE — Progress Notes (Signed)
ANTICOAGULATION CONSULT NOTE - Initial Consult  Pharmacy Consult for Warfarin Indication: atrial fibrillation  Allergies  Allergen Reactions  . Aspirin Hives    Patient Measurements: Height: 6' (182.9 cm) Weight: 254 lb 10.1 oz (115.5 kg) IBW/kg (Calculated) : 77.6 Heparin Dosing Weight: n/a  Vital Signs: Temp: 97.8 F (36.6 C) (02/15 1624) Temp src: Oral (02/15 1624) BP: 126/54 mmHg (02/15 1715) Pulse Rate: 40 (02/15 1715)  Labs:  Recent Labs  09/12/13 1425  HGB 11.4*  HCT 35.2*  PLT 292  LABPROT 31.1*  INR 3.14*  CREATININE 1.07  TROPONINI <0.30    Estimated Creatinine Clearance: 66.3 ml/min (by C-G formula based on Cr of 1.07).   Medical History: Past Medical History  Diagnosis Date  . Paroxysmal atrial fibrillation     cardioversion 01/25/10  . Diabetes mellitus   . Exogenous obesity   . Hypertension   . Microcytic anemia   . Hyperlipidemia   . GERD (gastroesophageal reflux disease)   . Hiatal hernia   . Osteoarthritis     Medications:  Prescriptions prior to admission  Medication Sig Dispense Refill  . acetaminophen (TYLENOL) 500 MG tablet Take 1,000 mg by mouth every 6 (six) hours as needed. For pain      . atorvastatin (LIPITOR) 80 MG tablet Take 80 mg by mouth every morning.       . cholestyramine (QUESTRAN) 4 G packet Take 4 g by mouth every morning.       . diltiazem (TIAZAC) 360 MG 24 hr capsule Take 360 mg by mouth every morning.      Marland Kitchen. glipiZIDE (GLUCOTROL XL) 10 MG 24 hr tablet Take 10 mg by mouth 2 (two) times daily.      Marland Kitchen. lisinopril (PRINIVIL,ZESTRIL) 40 MG tablet Take 40 mg by mouth every morning.       . metFORMIN (GLUCOPHAGE) 500 MG tablet Take 500-1,000 mg by mouth daily. 1 in the am and 2 in the pm      . metoprolol (LOPRESSOR) 50 MG tablet Take 125 mg by mouth 2 (two) times daily.      . Probiotic Product (ALIGN PO) Take 1 packet by mouth every morning.      Marland Kitchen. tiZANidine (ZANAFLEX) 2 MG tablet Take 2 mg by mouth every 8 (eight)  hours as needed for muscle spasms.      Marland Kitchen. triamterene-hydrochlorothiazide (MAXZIDE) 75-50 MG per tablet Take 1 tablet by mouth every morning.      . warfarin (COUMADIN) 5 MG tablet Take 2.5-5 mg by mouth every morning. M-W-F whole pill Tu-Th-Sat/Sun half pill        Assessment: [age] YOM with CC of dizziness and near syncope. Admitted for symptomatic bradycardia. Pharmacy to continue PTA coumadin for paroxysmal Afib. INR is slightly supra-therapeutic today at 3.14. H/H 11.4/35.2. Plt wnl. Patient has already taken today's dose. Will wait till tomorrow before giving another dose.   Home coumadin dose: 5 mg on MWF, 2.5 mg on all other days   Goal of Therapy:  INR 2-3 Monitor platelets by anticoagulation protocol: Yes   Plan:  -No more coumadin doses tonight.  -Monitor daily INR and s/s of bleeding   Vinnie LevelBenjamin Tim Corriher, PharmD.  Clinical Pharmacist Pager [phone]

## 2013-09-12 NOTE — H&P (Addendum)
Chief Complaint: Dizziness  HPI: The patient is a 80 year old gentleman who has been followed by Dr. Mare Ferrari. He has a history of hypertensive heart disease, paroxysmal atrial fibrillation and a recent history of TIA. He recently underwent a DCCV by Dr. Mare Ferrari on Jan. 26, 2015. He was successfully cardioverted back into NSR after 1 shock at 120J. He is on PO Cardizem and lopressor for rate control and Warfarin for anticoagulation. His last 2D echo was 08/04/13 demonstrating normal systolic function. The estimated ejection fraction was in the range of 55% to 60%. Wall motion was normal.  He presented to the [facility] ER today with a complaint of dizziness/ near syncope. He denies frank syncope. No chest pain or palpitations. He notes mild SOB. He states that he has had intermittent dizziness for the last several weeks. This first started when both his Cardizem and Lopressor were increased in an effort to achieve better rate control. He stated that his family called 911 today due to his complaints. They also noted that he didn't "look well" and they also felt he had slurred speech and confusion. When EMS arrived, he was noted to have a HR in the 30s. He was transferred to [facility] for further evaluation.   Since arriving he states that he feels better. His HR continues in the 40s, but he denies any further dizziness and near syncope.    Past Medical History  Diagnosis Date  . Paroxysmal atrial fibrillation     cardioversion 01/25/10  . Diabetes mellitus   . Exogenous obesity   . Hypertension   . Microcytic anemia   . Hyperlipidemia   . GERD (gastroesophageal reflux disease)   . Hiatal hernia   . Osteoarthritis     Past Surgical History  Procedure Laterality Date  . Total knee arthroplasty      B  . Cardioversion  01/25/10  . Cardiovascular stress test  2007    No ischemia. EF 61%  . US echocardiography  February 2011    Normal EF; LAE and RVE  . Cardioversion N/A 08/19/2013    Procedure:  CARDIOVERSION;  Surgeon: [name], MD;  Location: [facility] ENDOSCOPY;  Service: Cardiovascular;  Laterality: N/A;    Family History  Problem Relation Age of Onset  . Cancer Mother     deceased  . Pneumonia Father     deceased  . ALS Brother     deceased  . Cancer Brother     deceased/bone ca  . Cancer Sister     deceased   Social History:  reports that he quit smoking about 32 years ago. He does not have any smokeless tobacco history on file. He reports that he does not drink alcohol or use illicit drugs.  Allergies:  Allergies  Allergen Reactions  . Aspirin Hives   Prior to Admission medications   Medication Sig Start Date End Date Taking? Authorizing Provider  acetaminophen (TYLENOL) 500 MG tablet Take 1,000 mg by mouth every 6 (six) hours as needed. For pain   Yes Historical Provider, MD  atorvastatin (LIPITOR) 80 MG tablet Take 80 mg by mouth every morning.    Yes Historical Provider, MD  cholestyramine Lucrezia Starch) 4 G packet Take 4 g by mouth every morning.  08/23/13  Yes Historical Provider, MD  diltiazem (TIAZAC) 360 MG 24 hr capsule Take 360 mg by mouth every morning.   Yes Historical Provider, MD  glipiZIDE (GLUCOTROL XL) 10 MG 24 hr tablet Take 10 mg by mouth 2 (  two) times daily.   Yes Historical Provider, MD  lisinopril (PRINIVIL,ZESTRIL) 40 MG tablet Take 40 mg by mouth every morning.    Yes Historical Provider, MD  metFORMIN (GLUCOPHAGE) 500 MG tablet Take 500-1,000 mg by mouth daily. 1 in the am and 2 in the pm   Yes Historical Provider, MD  metoprolol (LOPRESSOR) 50 MG tablet Take 125 mg by mouth 2 (two) times daily.   Yes Historical Provider, MD  Probiotic Product (ALIGN PO) Take 1 packet by mouth every morning.   Yes Historical Provider, MD  tiZANidine (ZANAFLEX) 2 MG tablet Take 2 mg by mouth every 8 (eight) hours as needed for muscle spasms.   Yes Historical Provider, MD  triamterene-hydrochlorothiazide (MAXZIDE) 75-50 MG per tablet Take 1 tablet by mouth every  morning.   Yes Historical Provider, MD  warfarin (COUMADIN) 5 MG tablet Take 2.5-5 mg by mouth every morning. M-W-F whole pill Tu-Th-Sat/Sun half pill   Yes Historical Provider, MD    Results for orders placed during the hospital encounter of 09/12/13 (from the past 48 hour(s))  CBC WITH DIFFERENTIAL     Status: Abnormal   Collection Time    09/12/13  2:25 PM      Result Value Ref Range   WBC 7.2  4.0 - 10.5 K/uL   RBC 3.84 (*) 4.22 - 5.81 MIL/uL   Hemoglobin 11.4 (*) 13.0 - 17.0 g/dL   HCT 35.2 (*) 39.0 - 52.0 %   MCV 91.7  78.0 - 100.0 fL   MCH 29.7  26.0 - 34.0 pg   MCHC 32.4  30.0 - 36.0 g/dL   RDW 14.6  11.5 - 15.5 %   Platelets 292  150 - 400 K/uL   Neutrophils Relative % 76  43 - 77 %   Neutro Abs 5.4  1.7 - 7.7 K/uL   Lymphocytes Relative 16  12 - 46 %   Lymphs Abs 1.1  0.7 - 4.0 K/uL   Monocytes Relative 7  3 - 12 %   Monocytes Absolute 0.5  0.1 - 1.0 K/uL   Eosinophils Relative 2  0 - 5 %   Eosinophils Absolute 0.1  0.0 - 0.7 K/uL   Basophils Relative 0  0 - 1 %   Basophils Absolute 0.0  0.0 - 0.1 K/uL  BASIC METABOLIC PANEL     Status: Abnormal   Collection Time    09/12/13  2:25 PM      Result Value Ref Range   Sodium 141  137 - 147 mEq/L   Potassium 4.6  3.7 - 5.3 mEq/L   Chloride 108  96 - 112 mEq/L   CO2 22  19 - 32 mEq/L   Glucose, Bld 113 (*) 70 - 99 mg/dL   BUN 25 (*) 6 - 23 mg/dL   Creatinine, Ser 1.07  0.50 - 1.35 mg/dL   Calcium 8.8  8.4 - 10.5 mg/dL   GFR calc non Af Amer 61 (*) >90 mL/min   GFR calc Af Amer 71 (*) >90 mL/min   Comment: (NOTE)     The eGFR has been calculated using the CKD EPI equation.     This calculation has not been validated in all clinical situations.     eGFR's persistently <90 mL/min signify possible Chronic Kidney     Disease.  PROTIME-INR     Status: Abnormal   Collection Time    09/12/13  2:25 PM      Result Value Ref Range  Prothrombin Time 31.1 (*) 11.6 - 15.2 seconds   INR 3.14 (*) 0.00 - 1.49  TROPONIN I      Status: None   Collection Time    09/12/13  2:25 PM      Result Value Ref Range   Troponin I <0.30  <0.30 ng/mL   Comment:            Due to the release kinetics of cTnI,     a negative result within the first hours     of the onset of symptoms does not rule out     myocardial infarction with certainty.     If myocardial infarction is still suspected,     repeat the test at appropriate intervals.  GLUCOSE, CAPILLARY     Status: Abnormal   Collection Time    09/12/13  2:26 PM      Result Value Ref Range   Glucose-Capillary 105 (*) 70 - 99 mg/dL   Comment 1 Notify RN     Comment 2 Documented in Chart     Ct Head Wo Contrast  09/12/2013   CLINICAL DATA:  Dizziness, headache.  EXAM: CT HEAD WITHOUT CONTRAST  TECHNIQUE: Contiguous axial images were obtained from the base of the skull through the vertex without intravenous contrast.  COMPARISON:  CT scan of July 26, 2013.  FINDINGS: Bony calvarium appears intact. Mild diffuse cortical atrophy is noted. Mild chronic ischemic white matter disease is noted. No mass effect or midline shift is noted. Ventricular size is within normal limits. There is no evidence of mass lesion, hemorrhage or acute infarction.  IMPRESSION: Mild diffuse cortical atrophy. Mild chronic ischemic white matter disease. No acute intracranial abnormality seen.   Electronically Signed   By: Sabino Dick M.D.   On: 09/12/2013 14:53   Dg Chest Port 1 View  09/12/2013   CLINICAL DATA:  Hypertension.  Low oxygen saturation.  EXAM: PORTABLE CHEST - 1 VIEW  COMPARISON:  PA and lateral chest 04/13/2012 and 08/10/2013.  FINDINGS: There is cardiomegaly without edema. Lungs are clear. No pneumothorax or pleural effusion. No focal bony abnormality.  IMPRESSION: Cardiomegaly without acute disease.   Electronically Signed   By: Inge Rise M.D.   On: 09/12/2013 15:06    Review of Systems  Constitutional: Positive for malaise/fatigue. Negative for fever and chills.  Respiratory:  Positive for shortness of breath.   Cardiovascular: Negative for chest pain and palpitations.  Neurological: Positive for dizziness. Negative for loss of consciousness.  All other systems reviewed and are negative.    Blood pressure 127/58, pulse 36, temperature 97.4 F (36.3 C), temperature source Oral, resp. rate 14, height 6' (1.829 m), weight 260 lb (117.935 kg), SpO2 99.00%. Physical Exam  Constitutional: He is oriented to person, place, and time. He appears well-developed and well-nourished. No distress.  Neck: No JVD present. Carotid bruit is not present.  Cardiovascular: Exam reveals no gallop and no friction rub.   Murmur heard. Pulses:      Radial pulses are 2+ on the right side, and 2+ on the left side.       Dorsalis pedis pulses are 1+ on the right side, and 1+ on the left side.  Respiratory: Effort normal and breath sounds normal. No respiratory distress. He has no wheezes. He exhibits no tenderness.  GI: Soft. Bowel sounds are normal. He exhibits no distension and no mass. There is no tenderness.  Musculoskeletal: He exhibits no edema.  Neurological: He is alert and  oriented to person, place, and time.  Skin: Skin is dry. He is not diaphoretic.  Cool distal extremities   Psychiatric: He has a normal mood and affect. His behavior is normal.     Assessment/Plan Active Problems:   Bradycardia  Plan: 80 y/o male with history of PAF, s/p DDCV in Jan, also on rate control meds including Cardizem and Lopressor, admitted for symptomatic bradycardia. HR is currently in the 40s. HR has been as low as the 30s. This is in the setting of recent increase in his rate control medications. BP is a bit soft with SBP in the low 100s. Initial troponin is negative. CT of head was done in ER to r/o stroke and was negative. Will keep on telemetry. Will hold all AV nodal blocking agents. Will keep Zoll pads by bedside as well as PRN atropine. His bradycardia is likely medication induced,  however will check TSH. We may need to consider a PPM to allow for rate control for PAF. His INR is slightly supra therapeutic at 3.14. Goal is 2-3. Will ask pharmacy to follow and help with dosing.  MD to follow.   SIMMONS, BRITTAINY 09/12/2013, 3:48 PM   I have seen, examined the patient, and reviewed the above assessment and plan.  Changes to above are made where necessary.  The patient has a h/o afib for which he has been on rate control.  He now presents with symptomatic bradycardia.  He would like to avoid PPM.  Given his advanced age, this is probably reasonable.  Check TFTs. We will allow chronotropic agents to washout and then reassess.  Goal INR 2-3. Observe on telemetry in the interim.  Co Sign: Thompson Grayer, MD 09/12/2013 5:21 PM

## 2013-09-13 LAB — BASIC METABOLIC PANEL
BUN: 22 mg/dL (ref 6–23)
CO2: 22 mEq/L (ref 19–32)
Calcium: 8.8 mg/dL (ref 8.4–10.5)
Chloride: 110 mEq/L (ref 96–112)
Creatinine, Ser: 1.01 mg/dL (ref 0.50–1.35)
GFR calc Af Amer: 76 mL/min — ABNORMAL LOW (ref >90)
GFR, EST NON AFRICAN AMERICAN: 66 mL/min — AB (ref >90)
Glucose, Bld: 52 mg/dL — ABNORMAL LOW (ref 70–99)
POTASSIUM: 4.1 meq/L (ref 3.7–5.3)
SODIUM: 144 meq/L (ref 137–147)

## 2013-09-13 LAB — URINE CULTURE
COLONY COUNT: NO GROWTH
CULTURE: NO GROWTH

## 2013-09-13 LAB — CBC
HCT: 34.5 % — ABNORMAL LOW (ref 39.0–52.0)
Hemoglobin: 11.1 g/dL — ABNORMAL LOW (ref 13.0–17.0)
MCH: 29.6 pg (ref 26.0–34.0)
MCHC: 32.2 g/dL (ref 30.0–36.0)
MCV: 92 fL (ref 78.0–100.0)
Platelets: 244 10*3/uL (ref 150–400)
RBC: 3.75 MIL/uL — AB (ref 4.22–5.81)
RDW: 14.7 % (ref 11.5–15.5)
WBC: 5.5 10*3/uL (ref 4.0–10.5)

## 2013-09-13 LAB — TSH: TSH: 1.668 u[IU]/mL (ref 0.350–4.500)

## 2013-09-13 LAB — PROTIME-INR
INR: 2.63 — AB (ref 0.00–1.49)
PROTHROMBIN TIME: 27.2 s — AB (ref 11.6–15.2)

## 2013-09-13 LAB — T4: T4, Total: 7.9 ug/dL (ref 5.0–12.5)

## 2013-09-13 MED ORDER — WARFARIN SODIUM 5 MG PO TABS
5.0000 mg | ORAL_TABLET | ORAL | Status: DC
  Administered 2013-09-13: 5 mg via ORAL
  Filled 2013-09-13: qty 1

## 2013-09-13 MED ORDER — CHOLESTYRAMINE 4 G PO PACK
4.0000 g | PACK | ORAL | Status: DC
  Filled 2013-09-13: qty 1

## 2013-09-13 MED ORDER — WARFARIN SODIUM 2.5 MG PO TABS
2.5000 mg | ORAL_TABLET | ORAL | Status: DC
  Filled 2013-09-13: qty 1

## 2013-09-13 NOTE — Progress Notes (Signed)
UR completed 

## 2013-09-13 NOTE — Progress Notes (Signed)
ANTICOAGULATION CONSULT NOTE - Follow-up Consult  Pharmacy Consult for Warfarin Indication: atrial fibrillation  Allergies  Allergen Reactions  . Aspirin Hives    Patient Measurements: Height: 6' (182.9 cm) Weight: 254 lb 10.1 oz (115.5 kg) IBW/kg (Calculated) : 77.6  Vital Signs: Temp: 97.4 F (36.3 C) (02/16 1159) Temp src: Oral (02/16 1159) BP: 132/61 mmHg (02/16 1159) Pulse Rate: 57 (02/16 1159)  Labs:  Recent Labs  09/12/13 1425 09/13/13 0425  HGB 11.4* 11.1*  HCT 35.2* 34.5*  PLT 292 244  LABPROT 31.1* 27.2*  INR 3.14* 2.63*  CREATININE 1.07 1.01  TROPONINI <0.30  --     Estimated Creatinine Clearance: 70.2 ml/min (by C-G formula based on Cr of 1.01).  Assessment: 87 YOM on coumadin for PAF. INR therapeutic (2.63) today. No bleeding noted. CBC stable. Home coumadin dose: 5 mg on MWF, 2.5 mg on all other days   Goal of Therapy:  INR 2-3 Monitor platelets by anticoagulation protocol: Yes   Plan:  -Will start home dose of 5mg  MWF and 2.5mg  all other days  -Monitor daily INR and s/s of bleeding  Omar Chan, PharmD, BCPS Clinical pharmacist, pager [phone] 09/13/2013  1:49 PM

## 2013-09-13 NOTE — Progress Notes (Signed)
Patient: Omar Chan Date of Encounter: 09/13/2013, 7:20 AM Admit date: 09/12/2013     Subjective  Omar Chan has no complaints this AM. He denies CP or SOB. He denies dizziness.    Objective  Physical Exam: Vitals: BP 119/49  Pulse 51  Temp(Src) 97.9 F (36.6 C) (Oral)  Resp 23  Ht 6' (1.829 m)  Wt 254 lb 10.1 oz (115.5 kg)  BMI 34.53 kg/m2  SpO2 96% General: Well developed, well appearing 80 year old male in no acute distress. Neck: Supple. JVD not elevated. Lungs: Clear bilaterally to auscultation without wheezes, rales, or rhonchi. Breathing is unlabored. Heart: RRR S1 S2 without murmurs, rubs, or gallops.  Abdomen: Soft, non-distended. Extremities: No clubbing or cyanosis. No edema.  Distal pedal pulses are 2+ and equal bilaterally. Neuro: Alert and oriented X 3. Moves all extremities spontaneously. No focal deficits.  Intake/Output:  Intake/Output Summary (Last 24 hours) at 09/13/13 0720 Last data filed at 09/12/13 2200  Gross per 24 hour  Intake    243 ml  Output    925 ml  Net   -682 ml    Inpatient Medications:  . atorvastatin  80 mg Oral q morning - 10a  . cholestyramine  4 g Oral q morning - 10a  . glipiZIDE  10 mg Oral BID  . lisinopril  40 mg Oral q morning - 10a  . metFORMIN  1,000 mg Oral Q supper  . metFORMIN  500 mg Oral Q breakfast  . sodium chloride  3 mL Intravenous Q12H  . triamterene-hydrochlorothiazide  1 tablet Oral q morning - 10a  . Warfarin - Pharmacist Dosing Inpatient   Does not apply q1800    Labs:  Recent Labs  09/12/13 1425 09/13/13 0425  NA 141 144  K 4.6 4.1  CL 108 110  CO2 22 22  GLUCOSE 113* 52*  BUN 25* 22  CREATININE 1.07 1.01  CALCIUM 8.8 8.8    Recent Labs  09/12/13 1425 09/13/13 0425  WBC 7.2 5.5  NEUTROABS 5.4  --   HGB 11.4* 11.1*  HCT 35.2* 34.5*  MCV 91.7 92.0  PLT 292 244    Recent Labs  09/12/13 1425  TROPONINI <0.30    Recent Labs  09/12/13 1940  TSH 1.668  T4TOTAL 7.9     Recent Labs  09/13/13 0425  INR 2.63*    Radiology/Studies: Ct Head Wo Contrast  09/12/2013   CLINICAL DATA:  Dizziness, headache.  EXAM: CT HEAD WITHOUT CONTRAST  TECHNIQUE: Contiguous axial images were obtained from the base of the skull through the vertex without intravenous contrast.  COMPARISON:  CT scan of July 26, 2013.  FINDINGS: Bony calvarium appears intact. Mild diffuse cortical atrophy is noted. Mild chronic ischemic white matter disease is noted. No mass effect or midline shift is noted. Ventricular size is within normal limits. There is no evidence of mass lesion, hemorrhage or acute infarction.  IMPRESSION: Mild diffuse cortical atrophy. Mild chronic ischemic white matter disease. No acute intracranial abnormality seen.   Electronically Signed   By: Roque Lias M.D.   On: 09/12/2013 14:53   Dg Chest Port 1 View  09/12/2013   CLINICAL DATA:  Hypertension.  Low oxygen saturation.  EXAM: PORTABLE CHEST - 1 VIEW  COMPARISON:  PA and lateral chest 04/13/2012 and 08/10/2013.  FINDINGS: There is cardiomegaly without edema. Lungs are clear. No pneumothorax or pleural effusion. No focal bony abnormality.  IMPRESSION: Cardiomegaly without acute disease.  Electronically Signed   By: Drusilla Kannerhomas  Dalessio M.D.   On: 09/12/2013 15:06    Echocardiogram Jan 2015 Study Conclusions - Left ventricle: The cavity size was normal. There was mild concentric hypertrophy. Systolic function was normal. The estimated ejection fraction was in the range of 55% to 60%. Wall motion was normal; there were no regional wall motion abnormalities. Atrial fibrillation with rapid ventricular response precludes evaluation of LV diastolic function. - Left atrium: The atrium was severely dilated. - Right atrium: The atrium was mildly dilated. - Tricuspid valve: Mild-moderate regurgitation directed centrally. - Pulmonary arteries: Systolic pressure was moderately increased. PA peak pressure: 58mm Hg  (S).  Telemetry: sinus bradycardia in upper 50s   Assessment and Plan  1. Dizziness - resolved without recurrence while here; probably due to symptomatic bradycardia with rate 30 bpm per EMS report, in 40s on admission 2. Sinus bradycardia - improved off rate slowing medications so far 3. PAF - s/p DCCV on 08/23/2013 - continue warfarin for stroke prevention  Signed, EDMISTEN, BROOKE PA-C  EP Attending  Patient seen and examined. Agree with above. He is currently stable. I suspect he will ultimately require PPM and AV nodal blocking drugs +/- anti-arrhythmic drugs. For now will observe overnight with eye toward discharge tomorrow. He is currently maintaining NSR on anti-coagulation and off of AV nodal or sinus nodal slowing agents.   Leonia ReevesGregg Tashawn Greff,M.D.

## 2013-09-13 NOTE — Progress Notes (Signed)
Inpatient Diabetes Program Recommendations  AACE/ADA: New Consensus Statement on Inpatient Glycemic Control (2013)  Target Ranges:  Prepandial:   less than 140 mg/dL      Peak postprandial:   less than 180 mg/dL (1-2 hours)      Critically ill patients:  140 - 180 mg/dL   Inpatient Diabetes Program Recommendations Correction (SSI): rec sensitive scale TID+HS Oral Agents: consider discontinuing oral agents during hospitalization and use insulin instead Thank you  Omar Chan BSN, RN,CDE Inpatient Diabetes Coordinator [phone] (team pager)

## 2013-09-14 ENCOUNTER — Other Ambulatory Visit: Payer: Self-pay | Admitting: *Deleted

## 2013-09-14 LAB — BASIC METABOLIC PANEL
BUN: 19 mg/dL (ref 6–23)
CO2: 23 mEq/L (ref 19–32)
Calcium: 8.8 mg/dL (ref 8.4–10.5)
Chloride: 108 mEq/L (ref 96–112)
Creatinine, Ser: 0.81 mg/dL (ref 0.50–1.35)
GFR calc Af Amer: >90 mL/min (ref >90)
GFR calc non Af Amer: 79 mL/min — ABNORMAL LOW (ref >90)
Glucose, Bld: 56 mg/dL — ABNORMAL LOW (ref 70–99)
Potassium: 4.1 mEq/L (ref 3.7–5.3)
Sodium: 143 mEq/L (ref 137–147)

## 2013-09-14 LAB — PROTIME-INR
INR: 1.7 — ABNORMAL HIGH (ref 0.00–1.49)
Prothrombin Time: 19.5 seconds — ABNORMAL HIGH (ref 11.6–15.2)

## 2013-09-14 LAB — CBC
HCT: 33.2 % — ABNORMAL LOW (ref 39.0–52.0)
Hemoglobin: 11.1 g/dL — ABNORMAL LOW (ref 13.0–17.0)
MCH: 30.3 pg (ref 26.0–34.0)
MCHC: 33.4 g/dL (ref 30.0–36.0)
MCV: 90.7 fL (ref 78.0–100.0)
Platelets: 205 10*3/uL (ref 150–400)
RBC: 3.66 MIL/uL — AB (ref 4.22–5.81)
RDW: 14.4 % (ref 11.5–15.5)
WBC: 5.2 10*3/uL (ref 4.0–10.5)

## 2013-09-14 MED ORDER — WARFARIN SODIUM 5 MG PO TABS: 2.5000 mg | ORAL_TABLET | Freq: Every day | ORAL | Status: DC

## 2013-09-14 MED ORDER — AMIODARONE HCL 200 MG PO TABS: 200.0000 mg | ORAL_TABLET | Freq: Every day | ORAL | Status: DC

## 2013-09-14 NOTE — Progress Notes (Signed)
Patient ID: Omar Chan, male   DOB: 02-02-1926, 80 y.o.   MRN: [id number]   Patient Name: Omar Chan Date of Encounter: 09/14/2013     Active Problems:   Bradycardia    SUBJECTIVE  "I am ready to go home", denies chest pain or sob  CURRENT MEDS . atorvastatin  80 mg Oral q morning - 10a  . cholestyramine  4 g Oral Q24H  . glipiZIDE  10 mg Oral BID  . lisinopril  40 mg Oral q morning - 10a  . metFORMIN  1,000 mg Oral Q supper  . metFORMIN  500 mg Oral Q breakfast  . sodium chloride  3 mL Intravenous Q12H  . triamterene-hydrochlorothiazide  1 tablet Oral q morning - 10a  . warfarin  2.5 mg Oral Q T,Th,S,Su-1800  . warfarin  5 mg Oral Q M,W,F-1800  . Warfarin - Pharmacist Dosing Inpatient   Does not apply q1800    OBJECTIVE  Filed Vitals:   09/14/13 0100 09/14/13 0300 09/14/13 0415 09/14/13 0500  BP: 117/51 146/74 127/49   Pulse: 61 67 63 76  Temp:   98.4 F (36.9 C)   TempSrc:   Oral   Resp: 22 20 18 28   Height:      Weight:   254 lb 6.6 oz (115.4 kg)   SpO2: 89% 88% 88% 92%    Intake/Output Summary (Last 24 hours) at 09/14/13 0748 Last data filed at 09/14/13 0700  Gross per 24 hour  Intake    120 ml  Output   1100 ml  Net   -980 ml   Filed Weights   09/12/13 1400 09/12/13 1624 09/14/13 0415  Weight: 260 lb (117.935 kg) 254 lb 10.1 oz (115.5 kg) 254 lb 6.6 oz (115.4 kg)    PHYSICAL EXAM  General: Pleasant, NAD. Neuro: Alert and oriented X 3. Moves all extremities spontaneously. Psych: Normal affect. HEENT:  Normal  Neck: Supple without bruits or JVD. Lungs:  Resp regular and unlabored, CTA. Heart: RRR no s3, s4, or murmurs. Abdomen: Soft, non-tender, non-distended, BS + x 4.  Extremities: No clubbing, cyanosis or edema. DP/PT/Radials 2+ and equal bilaterally.  Accessory Clinical Findings  CBC  Recent Labs  09/12/13 1425 09/13/13 0425 09/14/13 0326  WBC 7.2 5.5 5.2  NEUTROABS 5.4  --   --   HGB 11.4* 11.1* 11.1*  HCT 35.2* 34.5* 33.2*   MCV 91.7 92.0 90.7  PLT 292 244 205   Basic Metabolic Panel  Recent Labs  09/13/13 0425 09/14/13 0326  NA 144 143  K 4.1 4.1  CL 110 108  CO2 22 23  GLUCOSE 52* 56*  BUN 22 19  CREATININE 1.01 0.81  CALCIUM 8.8 8.8   Liver Function Tests No results found for this basename: AST, ALT, ALKPHOS, BILITOT, PROT, ALBUMIN,  in the last 72 hours No results found for this basename: LIPASE, AMYLASE,  in the last 72 hours Cardiac Enzymes  Recent Labs  09/12/13 1425  TROPONINI <0.30   BNP No components found with this basename: POCBNP,  D-Dimer No results found for this basename: DDIMER,  in the last 72 hours Hemoglobin A1C No results found for this basename: HGBA1C,  in the last 72 hours Fasting Lipid Panel No results found for this basename: CHOL, HDL, LDLCALC, TRIG, CHOLHDL, LDLDIRECT,  in the last 72 hours Thyroid Function Tests  Recent Labs  09/12/13 1940  TSH 1.668  T4TOTAL 7.9    TELE  NSR   Radiology/Studies  Ct Head Wo Contrast  09/12/2013   CLINICAL DATA:  Dizziness, headache.  EXAM: CT HEAD WITHOUT CONTRAST  TECHNIQUE: Contiguous axial images were obtained from the base of the skull through the vertex without intravenous contrast.  COMPARISON:  CT scan of July 26, 2013.  FINDINGS: Bony calvarium appears intact. Mild diffuse cortical atrophy is noted. Mild chronic ischemic white matter disease is noted. No mass effect or midline shift is noted. Ventricular size is within normal limits. There is no evidence of mass lesion, hemorrhage or acute infarction.  IMPRESSION: Mild diffuse cortical atrophy. Mild chronic ischemic white matter disease. No acute intracranial abnormality seen.   Electronically Signed   By: Roque LiasJames  Green M.D.   On: 09/12/2013 14:53   Dg Chest Port 1 View  09/12/2013   CLINICAL DATA:  Hypertension.  Low oxygen saturation.  EXAM: PORTABLE CHEST - 1 VIEW  COMPARISON:  PA and lateral chest 04/13/2012 and 08/10/2013.  FINDINGS: There is  cardiomegaly without edema. Lungs are clear. No pneumothorax or pleural effusion. No focal bony abnormality.  IMPRESSION: Cardiomegaly without acute disease.   Electronically Signed   By: Drusilla Kannerhomas  Dalessio M.D.   On: 09/12/2013 15:06    ASSESSMENT AND PLAN 1. Atrial fib with an RVR 2. Symptomatic bradycardia 3.HTN 4. Obesity Rec:ok to go home as his bradycardia has resolved. Will try low dose amiodarone. He will need close follow up of his coumadin and a reduced dose, probably 2.5 mg daily. I would like to see him back in approx. 4 weeks to assess his response to amio. If he has more atrial fib with RVR he will need ppm and re-initiation of his beta blocker/calcium blocker.   Omar Chan,M.D.  09/14/2013 7:48 AM

## 2013-09-14 NOTE — Progress Notes (Signed)
Patient discharged per MD order.  Discharge instructions and paperwork given and reviewed with patient and spouse at bedside.  Instructions given to make follow up appointment for Coumadin on Friday 09/17/13.  Questions answered.  Patient transported to car in a wheelchair with family.

## 2013-09-14 NOTE — Discharge Summary (Signed)
ELECTROPHYSIOLOGY DISCHARGE SUMMARY    Patient ID: Omar Chan,  MRN: [id number], DOB/AGE: Dec 01, 1925 80 y.o.  Admit date: 09/12/2013 Discharge date: 09/14/2013  Primary Care Physician: Omar GosselinKevin Little, MD Primary Cardiologist: Omar SermonsBrackbill, MD Primary EP: Omar Ridgelaylor, MD  Primary Discharge Diagnosis:  1. Sinus bradycardia, resolved off rate controlling medications 2. Paroxysmal atrial fibrillation s/p recent DCCV 08/23/2013  Secondary Discharge Diagnoses:  1. HTN 2. DM 3. Dyslipidemia 4. GERD 5. Osteoarthritis  Procedures This Admission:  None   History and Hospital Course:  Mr. Omar Chan is an 80 year old gentleman who has been followed by Omar Chan. He has a history of hypertensive heart disease, paroxysmal atrial fibrillation and a recent history of TIA. He recently underwent a DCCV by Omar Chan on Jan. 26, 2015. He was successfully cardioverted back into NSR after 1 shock at 120J. He is on PO Cardizem and lopressor for rate control and Warfarin for anticoagulation. His last 2D echo was 08/04/13 demonstrating normal systolic function. The estimated ejection fraction was in the range of 55% to 60%. Wall motion was normal.   [facility] with a complaint of dizziness/ near syncope. He denies frank syncope. No chest pain or palpitations. He notes mild SOB. He states that he has had intermittent dizziness for the last several weeks. This first started when both his Cardizem and Lopressor were increased in an effort to achieve better rate control. He stated that his family called 911 today due to his complaints. They also noted that he didn't "look well" and they also felt he had slurred speech and confusion. When EMS arrived, he was noted to have a HR in the 30s. [facility]   He was admitted to stepdown. Metoprolol and diltiazem were discontinued. He remained in SR with improved rates. Bradycardia resolved. He did not have any  recurrent dizziness / near syncope. He has been seen, examined and deemed stable for discharge home today by Omar Chan. He will start low dose amiodarone (200 mg once daily). His Coumadin dose was lowered accordingly. He will follow-up with his PCP, Dr. Clarene DukeLittle, on Friday 09/17/2013 for PT/INR. He will follow-up with Omar Chan in 4 weeks.  Discharge Vitals: Blood pressure 137/65, pulse 67, temperature 97.3 F (36.3 C), temperature source Oral, resp. rate 32, height 6' (1.829 m), weight 254 lb 6.6 oz (115.4 kg), SpO2 96.00%.   Labs: Lab Results  Component Value Date   WBC 5.2 09/14/2013   HGB 11.1* 09/14/2013   HCT 33.2* 09/14/2013   MCV 90.7 09/14/2013   PLT 205 09/14/2013    Recent Labs Lab 09/14/13 0326  NA 143  K 4.1  CL 108  CO2 23  BUN 19  CREATININE 0.81  CALCIUM 8.8  GLUCOSE 56*   Lab Results  Component Value Date   CKTOTAL 66 09/15/2009   CKMB 1.3 09/15/2009   TROPONINI <0.30 09/12/2013     Recent Labs  09/14/13 0326  INR 1.70*    Disposition:  The patient is being discharged in stable condition.  Follow-up: Follow-up Information   Follow up with [name],[name], MD. Schedule an appointment as soon as possible for a visit on 09/17/2013. (For Coumadin follow-up on Friday 09/17/2013)    Specialty:  Family Medicine   Contact information:   [address] [place] [address] [phone]       Follow up with Omar BuntingGregg Taylor, MD On 10/08/2013. (At 10:15 AM)    Specialty:  Cardiology   Contact information:   [address] [place] [address] [phone]      Discharge Medications:    Medication List    STOP taking these medications       diltiazem 360 MG 24 hr capsule  Commonly known as:  TIAZAC     metoprolol 50 MG tablet  Commonly known as:  LOPRESSOR      TAKE these medications       acetaminophen 500 MG tablet  Commonly known as:  TYLENOL  Take 1,000 mg by mouth every 6 (six) hours as needed. For pain      ALIGN PO  Take 1 packet by mouth every morning.     amiodarone 200 MG tablet  Commonly known as:  PACERONE  Take 1 tablet (200 mg total) by mouth daily.     atorvastatin 80 MG tablet  Commonly known as:  LIPITOR  Take 80 mg by mouth every morning.     cholestyramine 4 G packet  Commonly known as:  QUESTRAN  Take 4 g by mouth every morning.     glipiZIDE 10 MG 24 hr tablet  Commonly known as:  GLUCOTROL XL  Take 10 mg by mouth 2 (two) times daily.     lisinopril 40 MG tablet  Commonly known as:  PRINIVIL,ZESTRIL  Take 40 mg by mouth every morning.     metFORMIN 500 MG tablet  Commonly known as:  GLUCOPHAGE  Take 500-1,000 mg by mouth daily. 1 in the am and 2 in the pm     tiZANidine 2 MG tablet  Commonly known as:  ZANAFLEX  Take 2 mg by mouth every 8 (eight) hours as needed for muscle spasms.     triamterene-hydrochlorothiazide 75-50 MG per tablet  Commonly known as:  MAXZIDE  Take 1 tablet by mouth every morning.     warfarin 5 MG tablet  Commonly known as:  COUMADIN  Take 0.5 tablets (2.5 mg total) by mouth daily.       Duration of Discharge Encounter: Greater than 30 minutes including physician time.  Signed, Omar Duff, PA-C 09/14/2013, 9:41 AM  EP Attending  Patient seen and examined. Agree with above. Ok for discharge. Discussed with Dr. Patty Sermons.  Leonia Reeves.D.

## 2013-10-06 ENCOUNTER — Encounter (INDEPENDENT_AMBULATORY_CARE_PROVIDER_SITE_OTHER): Payer: Medicare Other | Admitting: Ophthalmology

## 2013-10-06 DIAGNOSIS — E1139 Type 2 diabetes mellitus with other diabetic ophthalmic complication: Secondary | ICD-10-CM

## 2013-10-06 DIAGNOSIS — E1165 Type 2 diabetes mellitus with hyperglycemia: Secondary | ICD-10-CM

## 2013-10-06 DIAGNOSIS — H3530 Unspecified macular degeneration: Secondary | ICD-10-CM

## 2013-10-06 DIAGNOSIS — H35039 Hypertensive retinopathy, unspecified eye: Secondary | ICD-10-CM

## 2013-10-06 DIAGNOSIS — I10 Essential (primary) hypertension: Secondary | ICD-10-CM

## 2013-10-06 DIAGNOSIS — H43819 Vitreous degeneration, unspecified eye: Secondary | ICD-10-CM

## 2013-10-06 DIAGNOSIS — E11319 Type 2 diabetes mellitus with unspecified diabetic retinopathy without macular edema: Secondary | ICD-10-CM

## 2013-10-08 ENCOUNTER — Ambulatory Visit (INDEPENDENT_AMBULATORY_CARE_PROVIDER_SITE_OTHER): Payer: Medicare Other | Admitting: Internal Medicine

## 2013-10-08 ENCOUNTER — Encounter: Payer: Self-pay | Admitting: Internal Medicine

## 2013-10-08 VITALS — BP 152/80 | HR 81 | Ht 72.0 in | Wt 259.0 lb

## 2013-10-08 DIAGNOSIS — I480 Paroxysmal atrial fibrillation: Secondary | ICD-10-CM

## 2013-10-08 DIAGNOSIS — I498 Other specified cardiac arrhythmias: Secondary | ICD-10-CM

## 2013-10-08 DIAGNOSIS — R001 Bradycardia, unspecified: Secondary | ICD-10-CM

## 2013-10-08 DIAGNOSIS — I4891 Unspecified atrial fibrillation: Secondary | ICD-10-CM

## 2013-10-08 DIAGNOSIS — E785 Hyperlipidemia, unspecified: Secondary | ICD-10-CM

## 2013-10-08 MED ORDER — AMIODARONE HCL 200 MG PO TABS: 200.0000 mg | ORAL_TABLET | Freq: Every day | ORAL | Status: DC

## 2013-10-08 MED ORDER — ATORVASTATIN CALCIUM 80 MG PO TABS: 40.0000 mg | ORAL_TABLET | Freq: Every morning | ORAL | Status: DC

## 2013-10-08 NOTE — Progress Notes (Signed)
HPI Mr. Omar Chan is referred today by Dr. Patty Sermons and Cameron Proud for evaluation of atrial fibrillation and bradycardia. He has a h/o HTN, DM, obesity and arthritis. He was found to have atrial fibrillation and initially treated with rate control. He was placed on amiodarone and cardioverted back to NSR. He has done well in the interim. He has had HR's at home on the monitor of 70-80/min. He has never had syncope. He is not sure he has much in the way of symptoms. He is not sure he feels much better and is not sure he can tell whether he is in atrial fib. Allergies  Allergen Reactions  . Aspirin Hives     Current Outpatient Prescriptions  Medication Sig Dispense Refill  . acetaminophen (TYLENOL) 500 MG tablet Take 1,000 mg by mouth every 6 (six) hours as needed. For pain      . atorvastatin (LIPITOR) 80 MG tablet Take 80 mg by mouth every morning.       Marland Kitchen gemfibrozil (LOPID) 600 MG tablet 2 (two) times daily.      Marland Kitchen glipiZIDE (GLUCOTROL XL) 10 MG 24 hr tablet Take 10 mg by mouth 2 (two) times daily.      Marland Kitchen lisinopril (PRINIVIL,ZESTRIL) 40 MG tablet Take 40 mg by mouth every morning.       . metFORMIN (GLUCOPHAGE) 500 MG tablet Take 500-1,000 mg by mouth daily. 1 in the am and 2 in the pm      . Probiotic Product (ALIGN PO) Take 1 packet by mouth every morning.      . triamterene-hydrochlorothiazide (MAXZIDE) 75-50 MG per tablet Take 1 tablet by mouth every morning.      . warfarin (COUMADIN) 5 MG tablet Take 0.5 tablets (2.5 mg total) by mouth daily.       No current facility-administered medications for this visit.     Past Medical History  Diagnosis Date  . Paroxysmal atrial fibrillation     cardioversion 01/25/10  . Diabetes mellitus   . Exogenous obesity   . Hypertension   . Microcytic anemia   . Hyperlipidemia   . GERD (gastroesophageal reflux disease)   . Hiatal hernia   . Osteoarthritis     ROS:   All systems reviewed and negative except as noted in the  HPI.   Past Surgical History  Procedure Laterality Date  . Total knee arthroplasty      B  . Cardioversion  01/25/10  . Cardiovascular stress test  2007    No ischemia. EF 61%  . US echocardiography  February 2011    Normal EF; LAE and RVE  . Cardioversion N/A 08/19/2013    Procedure: CARDIOVERSION;  Surgeon: Cassell Clement, MD;  Location: [facility] ENDOSCOPY;  Service: Cardiovascular;  Laterality: N/A;     Family History  Problem Relation Age of Onset  . Cancer Mother     deceased  . Pneumonia Father     deceased  . ALS Brother     deceased  . Cancer Brother     deceased/bone ca  . Cancer Sister     deceased     History   Social History  . Marital Status: Married    Spouse Name: N/A    Number of Children: N/A  . Years of Education: N/A   Occupational History  . Not on file.   Social History Main Topics  . Smoking status: Former Smoker    Quit date: 07/29/1981  . Smokeless  tobacco: Not on file  . Alcohol Use: No  . Drug Use: No  . Sexual Activity: Yes   Other Topics Concern  . Not on file   Social History Narrative  . No narrative on file     BP 152/80  Pulse 81  Ht 6' (1.829 m)  Wt 259 lb (117.482 kg)  BMI 35.12 kg/m2  Physical Exam:  Well appearing elderly man, NAD HEENT: Unremarkable Neck:  No JVD, no thyromegally Lymphatics:  No adenopathy Back:  No CVA tenderness Lungs:  Clear HEART:  Regular rate rhythm, no murmurs, no rubs, no clicks Abd:  soft, positive bowel sounds, no organomegally, no rebound, no guarding Ext:  2 plus pulses, no edema, no cyanosis, no clubbing Skin:  No rashes no nodules Neuro:  CN II through XII intact, motor grossly intact  EKG nsr    Assess/Plan:

## 2013-10-08 NOTE — Patient Instructions (Signed)
Your physician has recommended you make the following change in your medication:  1) Decrease your Lipitor to 40 mg daily   Follow up with [name]  Dr. Ladona Ridgelaylor will see you as needed

## 2013-10-08 NOTE — Assessment & Plan Note (Signed)
As he is on amio, I have asked the patient to reduce his dose of atorvastatin to 40 mg daily. He will maintain a low fat diet and try to lose more weight.

## 2013-10-08 NOTE — Assessment & Plan Note (Signed)
This appears to have resolved off of his calcium blocker and beta blocker. He will continue amio. No indication for PPM at this time. I will see him back as needed.

## 2013-10-08 NOTE — Assessment & Plan Note (Signed)
He is maintaining NSR and his rates are good. He will continue his current meds.

## 2013-12-01 ENCOUNTER — Encounter: Payer: Self-pay | Admitting: Cardiology

## 2013-12-01 ENCOUNTER — Ambulatory Visit (INDEPENDENT_AMBULATORY_CARE_PROVIDER_SITE_OTHER): Payer: Medicare Other | Admitting: Cardiology

## 2013-12-01 VITALS — BP 126/78 | HR 84 | Ht 72.0 in | Wt 263.0 lb

## 2013-12-01 DIAGNOSIS — I4891 Unspecified atrial fibrillation: Secondary | ICD-10-CM

## 2013-12-01 DIAGNOSIS — I10 Essential (primary) hypertension: Secondary | ICD-10-CM

## 2013-12-01 DIAGNOSIS — I480 Paroxysmal atrial fibrillation: Secondary | ICD-10-CM

## 2013-12-01 DIAGNOSIS — I119 Hypertensive heart disease without heart failure: Secondary | ICD-10-CM

## 2013-12-01 DIAGNOSIS — E785 Hyperlipidemia, unspecified: Secondary | ICD-10-CM

## 2013-12-01 DIAGNOSIS — E669 Obesity, unspecified: Secondary | ICD-10-CM

## 2013-12-01 DIAGNOSIS — E6609 Other obesity due to excess calories: Secondary | ICD-10-CM

## 2013-12-01 MED ORDER — AMIODARONE HCL 200 MG PO TABS: 200.0000 mg | ORAL_TABLET | Freq: Every day | ORAL | Status: DC

## 2013-12-01 NOTE — Assessment & Plan Note (Signed)
Patient has diabetes and exogenous obesity.  He has not been to careful with his diet.  He does enjoy sweets.  His weight is up 5 pounds since last visit.

## 2013-12-01 NOTE — Patient Instructions (Addendum)
Your physician recommends that you continue on your current medications as directed. Please refer to the Current Medication list given to you today.  Your physician recommends that you schedule a follow-up appointment in: 4 MONTH OV/EKG  WORK HARDER ON DIET AND WEIGHT LOSS

## 2013-12-01 NOTE — Progress Notes (Signed)
Omar Chan Date of Birth:  Dec 03, 1925 [facility] [address] [place]  [address] [phone]        Fax   [phone]   History of Present Illness: Mr. Omar Chan is seen back today for a scheduled followup office visit. Has hypertensive heart disease, DM, obesity, HLD, GERD, OA, hiatal hernia and PAF. Has had past spell in December of 2014 where he had a spell where he had memory problems, difficulty with his gait and concern that he may have had a TIA. He is limited by chronic dyspnea/arthritis and obesity. Remains quite sedentary. Has had past cardioversion on 08/23/13.  The patient was hospitalized on 09/12/13 until 09/14/13 for marked bradycardia.  He was admitted initially thinking he might need a pacemaker.  However when his beta blocker and diltiazem was held, his heart rate improved and he did not require a pacemaker.  His only antiarrhythmic now is low dose amiodarone.  He is on long-term Coumadin anticoagulation monitored through his PCP office.  Since last visit he's been doing well.   Current Outpatient Prescriptions  Medication Sig Dispense Refill  . acetaminophen (TYLENOL) 500 MG tablet Take 1,000 mg by mouth every 6 (six) hours as needed. For pain      . amiodarone (PACERONE) 200 MG tablet Take 1 tablet (200 mg total) by mouth daily.  90 tablet  3  . atorvastatin (LIPITOR) 80 MG tablet Take 0.5 tablets (40 mg total) by mouth every morning.  15 tablet  6  . gemfibrozil (LOPID) 600 MG tablet 2 (two) times daily.      Marland Kitchen. glipiZIDE (GLUCOTROL XL) 10 MG 24 hr tablet Take 10 mg by mouth 2 (two) times daily.      Marland Kitchen. lisinopril (PRINIVIL,ZESTRIL) 40 MG tablet Take 40 mg by mouth every morning.       . metFORMIN (GLUCOPHAGE) 500 MG tablet Take 500-1,000 mg by mouth daily. 1 in the am and 2 in the pm      . Probiotic Product (ALIGN PO) Take 1 packet by mouth every morning.      . triamterene-hydrochlorothiazide (MAXZIDE) 75-50 MG per tablet Take 1 tablet by  mouth every morning.      . warfarin (COUMADIN) 5 MG tablet Take 0.5 tablets (2.5 mg total) by mouth daily.       No current facility-administered medications for this visit.    Allergies  Allergen Reactions  . Aspirin Hives    Patient Active Problem List   Diagnosis Date Noted  . Dyslipidemia 10/08/2013  . Bradycardia 09/12/2013  . Edema 12/03/2010  . A-fib   . Diabetes mellitus   . Exogenous obesity   . Hypertension     History  Smoking status  . Former Smoker  . Quit date: 07/29/1981  Smokeless tobacco  . Not on file    History  Alcohol Use No    Family History  Problem Relation Age of Onset  . Cancer Mother     deceased  . Pneumonia Father     deceased  . ALS Brother     deceased  . Cancer Brother     deceased/bone ca  . Cancer Sister     deceased    Review of Systems: Constitutional: no fever chills diaphoresis or fatigue or change in weight.  Head and neck: no hearing loss, no epistaxis, no photophobia or visual disturbance. Respiratory: No cough, shortness of breath or wheezing. Cardiovascular: No chest pain peripheral edema,  palpitations. Gastrointestinal: No abdominal distention, no abdominal pain, no change in bowel habits hematochezia or melena. Genitourinary: No dysuria, no frequency, no urgency, no nocturia. Musculoskeletal:No arthralgias, no back pain, no gait disturbance or myalgias. Neurological: No dizziness, no headaches, no numbness, no seizures, no syncope, no weakness, no tremors. Hematologic: No lymphadenopathy, no easy bruising. Psychiatric: No confusion, no hallucinations, no sleep disturbance.    Physical Exam: Filed Vitals:   12/01/13 1015  BP: 126/78  Pulse: 84   the general appearance reveals a large elderly gentleman in no acute distress.The head and neck exam reveals pupils equal and reactive.  Extraocular movements are full.  There is no scleral icterus.  The mouth and pharynx are normal.  The neck is supple.  The  carotids reveal no bruits.  The jugular venous pressure is normal.  The  thyroid is not enlarged.  There is no lymphadenopathy.  The chest is clear to percussion and auscultation.  There are no rales or rhonchi.  Expansion of the chest is symmetrical.  The precordium is quiet.  The first heart sound is normal.  The second heart sound is physiologically split.  There is no murmur gallop rub or click.  There is no abnormal lift or heave.  The abdomen is soft and nontender.  The bowel sounds are normal.  The liver and spleen are not enlarged.  There are no abdominal masses.  There are no abdominal bruits.  Extremities reveal good pedal pulses.  There is no phlebitis or edema.  There is no cyanosis or clubbing.  Strength is normal and symmetrical in all extremities.  There is no lateralizing weakness.  There are no sensory deficits.  The skin is warm and dry.  There is no rash.     Assessment / Plan: 1.  Paroxysmal atrial fibrillation, currently maintaining normal sinus rhythm 2. hypertensive heart disease without CHF 3. Obesity 4. diabetes mellitus 5. Dyslipidemia  Plan: Patient is stable from a cardiovascular standpoint.  Continue amiodarone 200 mg daily.  Recheck in 4 months for followup office visit and EKG

## 2013-12-01 NOTE — Assessment & Plan Note (Signed)
The patient has not had any symptoms of CHF.  He notes occasional dizziness if he stands up too quickly.  He is sedentary and walks with a cane.

## 2013-12-01 NOTE — Assessment & Plan Note (Signed)
The patient has had no further atrial fibrillation.  He remains in normal sinus rhythm.  He remains on warfarin and is not having any bleeding problems.

## 2014-04-19 ENCOUNTER — Ambulatory Visit (INDEPENDENT_AMBULATORY_CARE_PROVIDER_SITE_OTHER): Payer: Medicare Other | Admitting: Cardiology

## 2014-04-19 VITALS — BP 148/90 | HR 62 | Ht 78.0 in | Wt 266.0 lb

## 2014-04-19 DIAGNOSIS — I4891 Unspecified atrial fibrillation: Secondary | ICD-10-CM

## 2014-04-19 DIAGNOSIS — I119 Hypertensive heart disease without heart failure: Secondary | ICD-10-CM

## 2014-04-19 DIAGNOSIS — R609 Edema, unspecified: Secondary | ICD-10-CM

## 2014-04-19 DIAGNOSIS — I480 Paroxysmal atrial fibrillation: Secondary | ICD-10-CM

## 2014-04-19 MED ORDER — METOLAZONE 5 MG PO TABS: ORAL_TABLET | ORAL | Status: DC

## 2014-04-19 NOTE — Patient Instructions (Addendum)
START METOLAZONE 5 MG 1 EVERY Monday AND Thursday ONLY  Your physician wants you to follow-up in: 4 MONTH OV/CBC/BMET/TSH You will receive a reminder letter in the mail two months in advance. If you don't receive a letter, please call our office to schedule the follow-up appointment.

## 2014-04-19 NOTE — Addendum Note (Signed)
Addended by: Burnell Blanks on: 04/19/2014 06:49 PM   Modules accepted: Orders

## 2014-04-19 NOTE — Progress Notes (Signed)
[name] Date of Birth:  March 13, 1926 [facility] [address] [place], [place]  [address] [phone]        Fax   [phone]   History of Present Illness: Omar Chan is seen back today for a followup office visit.  He was sent over by Dr. Catha Gosselin.  The patient has had persistent significant edema of his lower extremities. Has hypertensive heart disease, DM, obesity, HLD, GERD, OA, hiatal hernia and PAF. Has had past spell in December of 2014 where he had a spell where he had memory problems, difficulty with his gait and concern that he may have had a TIA. He is limited by chronic dyspnea/arthritis and obesity. Remains quite sedentary. Has had past cardioversion on 08/23/13.  The patient was hospitalized on 09/12/13 until 09/14/13 for marked bradycardia.  He was admitted initially thinking he might need a pacemaker.  However when his beta blocker and diltiazem was held, his heart rate improved and he did not require a pacemaker.  His only antiarrhythmic now is low dose amiodarone.  He is on long-term Coumadin anticoagulation monitored through his PCP office.  Since last visit he's been doing well except for his significant peripheral edema.  He states that the edema does not go down much at night.  He has significant urinary frequency and really is resistant to the idea of going any higher on the Lasix than his present dose of 80 mg daily.  He is not having any increased dyspnea associated with the referral edema.  His weight is up 3 pounds since we saw him in May 2015. He remains in normal sinus rhythm following his cardioversion.  He himself is not aware of his heartbeat.   Current Outpatient Prescriptions  Medication Sig Dispense Refill  . acetaminophen (TYLENOL) 500 MG tablet Take 1,000 mg by mouth every 6 (six) hours as needed. For pain      . amiodarone (PACERONE) 200 MG tablet Take 1 tablet (200 mg total) by mouth daily.  90 tablet  3  . atorvastatin  (LIPITOR) 80 MG tablet Take 0.5 tablets (40 mg total) by mouth every morning.  15 tablet  6  . furosemide (LASIX) 40 MG tablet       . gemfibrozil (LOPID) 600 MG tablet 2 (two) times daily.      Marland Kitchen lisinopril (PRINIVIL,ZESTRIL) 40 MG tablet Take 40 mg by mouth every morning.       . metFORMIN (GLUCOPHAGE) 500 MG tablet Take 500-1,000 mg by mouth daily. 1 in the am and 2 in the pm      . warfarin (COUMADIN) 5 MG tablet Take 0.5 tablets (2.5 mg total) by mouth daily.      Marland Kitchen glipiZIDE (GLUCOTROL XL) 10 MG 24 hr tablet Take 10 mg by mouth 2 (two) times daily.      . metolazone (ZAROXOLYN) 5 MG tablet ONE TABLET ON Monday AND Thursday ONLY OR AS DIRECTED  35 tablet  3  . Probiotic Product (ALIGN PO) Take 1 packet by mouth every morning.      . triamterene-hydrochlorothiazide (MAXZIDE) 75-50 MG per tablet Take 1 tablet by mouth every morning.       No current facility-administered medications for this visit.    Allergies  Allergen Reactions  . Aspirin Hives    Patient Active Problem List   Diagnosis Date Noted  . Peripheral edema 04/19/2014  . Dyslipidemia 10/08/2013  . Bradycardia 09/12/2013  . Edema 12/03/2010  .  A-fib   . Diabetes mellitus   . Exogenous obesity   . Hypertension     History  Smoking status  . Former Smoker  . Quit date: 07/29/1981  Smokeless tobacco  . Not on file    History  Alcohol Use No    Family History  Problem Relation Age of Onset  . Cancer Mother     deceased  . Pneumonia Father     deceased  . ALS Brother     deceased  . Cancer Brother     deceased/bone ca  . Cancer Sister     deceased    Review of Systems: Constitutional: no fever chills diaphoresis or fatigue or change in weight.  Head and neck: no hearing loss, no epistaxis, no photophobia or visual disturbance. Respiratory: No cough, shortness of breath or wheezing. Cardiovascular: No chest pain peripheral edema, palpitations. Gastrointestinal: No abdominal distention, no  abdominal pain, no change in bowel habits hematochezia or melena. Genitourinary: No dysuria, no frequency, no urgency, no nocturia. Musculoskeletal:No arthralgias, no back pain, no gait disturbance or myalgias. Neurological: No dizziness, no headaches, no numbness, no seizures, no syncope, no weakness, no tremors. Hematologic: No lymphadenopathy, no easy bruising. Psychiatric: No confusion, no hallucinations, no sleep disturbance.    Physical Exam: Filed Vitals:   04/19/14 1549  BP: 148/90  Pulse: 62   the general appearance reveals a large elderly gentleman in no acute distress.The head and neck exam reveals pupils equal and reactive.  Extraocular movements are full.  There is no scleral icterus.  The mouth and pharynx are normal.  The neck is supple.  The carotids reveal no bruits.  The jugular venous pressure is normal.  The  thyroid is not enlarged.  There is no lymphadenopathy.  The chest is clear to percussion and auscultation.  There are no rales or rhonchi.  Expansion of the chest is symmetrical.  The precordium is quiet.  The first heart sound is normal.  The second heart sound is physiologically split.  There is no murmur gallop rub or click.  There is no abnormal lift or heave.  The abdomen is soft and nontender.  The bowel sounds are normal.  The liver and spleen are not enlarged.  There are no abdominal masses.  There are no abdominal bruits.  Extremities reveal good pedal pulses.  There is 3+ bilateral pitting pretibial edema.  There is no cyanosis or clubbing.  Strength is normal and symmetrical in all extremities.  There is no lateralizing weakness.  There are no sensory deficits.  The skin is warm and dry.  There is no rash.  EKG today shows sinus bradycardia with first-degree AV block and a heart rate of 55   Assessment / Plan: 1.  Paroxysmal atrial fibrillation, currently maintaining normal sinus rhythm 2. hypertensive heart disease. 3. Obesity 4. diabetes mellitus 5.  Dyslipidemia 6. significant lower extremity edema  Plan: Continue current dose of Lasix.  He is agreeable to taking metolazone 5 mg twice a week to use as a booster.  He will take the metolazone every Monday and Thursday morning along with his 80 mg Lasix. Recheck in 4 months for office visit.  We will also check CBC blood chemistries and TSH to followup on his long-term amiodarone.

## 2014-04-20 ENCOUNTER — Other Ambulatory Visit: Payer: Self-pay | Admitting: *Deleted

## 2014-04-20 ENCOUNTER — Encounter: Payer: Self-pay | Admitting: Cardiology

## 2014-04-20 MED ORDER — METOLAZONE 5 MG PO TABS: ORAL_TABLET | ORAL | Status: DC

## 2014-05-25 ENCOUNTER — Encounter (HOSPITAL_COMMUNITY): Payer: Self-pay | Admitting: Emergency Medicine

## 2014-05-25 ENCOUNTER — Inpatient Hospital Stay (HOSPITAL_COMMUNITY)
Admission: EM | Admit: 2014-05-25 | Discharge: 2014-05-27 | DRG: 379 | Disposition: A | Discharge status: Home / Self Care | Payer: Medicare Other | Attending: Family Medicine | Admitting: Family Medicine

## 2014-05-25 DIAGNOSIS — Z7901 Long term (current) use of anticoagulants: Secondary | ICD-10-CM | POA: Diagnosis not present

## 2014-05-25 DIAGNOSIS — I480 Paroxysmal atrial fibrillation: Secondary | ICD-10-CM

## 2014-05-25 DIAGNOSIS — E669 Obesity, unspecified: Secondary | ICD-10-CM | POA: Diagnosis present

## 2014-05-25 DIAGNOSIS — Z6835 Body mass index (BMI) 35.0-35.9, adult: Secondary | ICD-10-CM

## 2014-05-25 DIAGNOSIS — K922 Gastrointestinal hemorrhage, unspecified: Secondary | ICD-10-CM | POA: Diagnosis present

## 2014-05-25 DIAGNOSIS — E118 Type 2 diabetes mellitus with unspecified complications: Secondary | ICD-10-CM

## 2014-05-25 DIAGNOSIS — D699 Hemorrhagic condition, unspecified: Secondary | ICD-10-CM

## 2014-05-25 DIAGNOSIS — I10 Essential (primary) hypertension: Secondary | ICD-10-CM | POA: Diagnosis present

## 2014-05-25 DIAGNOSIS — Z66 Do not resuscitate: Secondary | ICD-10-CM | POA: Diagnosis present

## 2014-05-25 DIAGNOSIS — R001 Bradycardia, unspecified: Secondary | ICD-10-CM

## 2014-05-25 DIAGNOSIS — R791 Abnormal coagulation profile: Secondary | ICD-10-CM | POA: Diagnosis present

## 2014-05-25 DIAGNOSIS — Z888 Allergy status to other drugs, medicaments and biological substances status: Secondary | ICD-10-CM | POA: Diagnosis not present

## 2014-05-25 DIAGNOSIS — T45515A Adverse effect of anticoagulants, initial encounter: Secondary | ICD-10-CM | POA: Diagnosis present

## 2014-05-25 DIAGNOSIS — D500 Iron deficiency anemia secondary to blood loss (chronic): Secondary | ICD-10-CM | POA: Diagnosis present

## 2014-05-25 DIAGNOSIS — Z87891 Personal history of nicotine dependence: Secondary | ICD-10-CM | POA: Diagnosis not present

## 2014-05-25 DIAGNOSIS — R195 Other fecal abnormalities: Secondary | ICD-10-CM | POA: Diagnosis present

## 2014-05-25 DIAGNOSIS — E119 Type 2 diabetes mellitus without complications: Secondary | ICD-10-CM | POA: Diagnosis present

## 2014-05-25 DIAGNOSIS — I959 Hypotension, unspecified: Secondary | ICD-10-CM | POA: Diagnosis present

## 2014-05-25 DIAGNOSIS — E6609 Other obesity due to excess calories: Secondary | ICD-10-CM

## 2014-05-25 DIAGNOSIS — K219 Gastro-esophageal reflux disease without esophagitis: Secondary | ICD-10-CM | POA: Diagnosis present

## 2014-05-25 DIAGNOSIS — E785 Hyperlipidemia, unspecified: Secondary | ICD-10-CM | POA: Diagnosis present

## 2014-05-25 DIAGNOSIS — D649 Anemia, unspecified: Secondary | ICD-10-CM | POA: Diagnosis present

## 2014-05-25 DIAGNOSIS — R609 Edema, unspecified: Secondary | ICD-10-CM

## 2014-05-25 DIAGNOSIS — D6832 Hemorrhagic disorder due to extrinsic circulating anticoagulants: Secondary | ICD-10-CM

## 2014-05-25 DIAGNOSIS — M1990 Unspecified osteoarthritis, unspecified site: Secondary | ICD-10-CM | POA: Diagnosis present

## 2014-05-25 DIAGNOSIS — Z5181 Encounter for therapeutic drug level monitoring: Secondary | ICD-10-CM

## 2014-05-25 LAB — COMPREHENSIVE METABOLIC PANEL
ALK PHOS: 70 U/L (ref 39–117)
ALT: 10 U/L (ref 0–53)
AST: 13 U/L (ref 0–37)
Albumin: 3.2 g/dL — ABNORMAL LOW (ref 3.5–5.2)
Anion gap: 14 (ref 5–15)
BUN: 50 mg/dL — ABNORMAL HIGH (ref 6–23)
CO2: 25 mEq/L (ref 19–32)
CREATININE: 1.83 mg/dL — AB (ref 0.50–1.35)
Calcium: 8.7 mg/dL (ref 8.4–10.5)
Chloride: 103 mEq/L (ref 96–112)
GFR calc non Af Amer: 32 mL/min — ABNORMAL LOW (ref >90)
GFR, EST AFRICAN AMERICAN: 37 mL/min — AB (ref >90)
Glucose, Bld: 226 mg/dL — ABNORMAL HIGH (ref 70–99)
Potassium: 4.5 mEq/L (ref 3.7–5.3)
Sodium: 142 mEq/L (ref 137–147)
Total Protein: 6.4 g/dL (ref 6.0–8.3)

## 2014-05-25 LAB — CBC
HEMATOCRIT: 24.1 % — AB (ref 39.0–52.0)
Hemoglobin: 7.5 g/dL — ABNORMAL LOW (ref 13.0–17.0)
MCH: 28 pg (ref 26.0–34.0)
MCHC: 31.1 g/dL (ref 30.0–36.0)
MCV: 89.9 fL (ref 78.0–100.0)
Platelets: 311 10*3/uL (ref 150–400)
RBC: 2.68 MIL/uL — ABNORMAL LOW (ref 4.22–5.81)
RDW: 14.9 % (ref 11.5–15.5)
WBC: 5.8 10*3/uL (ref 4.0–10.5)

## 2014-05-25 LAB — POC OCCULT BLOOD, ED: FECAL OCCULT BLD: POSITIVE — AB

## 2014-05-25 LAB — PREPARE RBC (CROSSMATCH)

## 2014-05-25 LAB — PROTIME-INR
INR: 3.93 — ABNORMAL HIGH (ref 0.00–1.49)
Prothrombin Time: 38.7 seconds — ABNORMAL HIGH (ref 11.6–15.2)

## 2014-05-25 MED ORDER — SODIUM CHLORIDE 0.9 % IV SOLN: Freq: Once | INTRAVENOUS | Status: DC

## 2014-05-25 MED ORDER — PHYTONADIONE 5 MG PO TABS
2.5000 mg | ORAL_TABLET | Freq: Once | ORAL | Status: AC
  Administered 2014-05-26: 2.5 mg via ORAL
  Filled 2014-05-25 (×2): qty 1

## 2014-05-25 MED ORDER — ATORVASTATIN CALCIUM 40 MG PO TABS
40.0000 mg | ORAL_TABLET | Freq: Every morning | ORAL | Status: DC
  Administered 2014-05-26 – 2014-05-27 (×2): 40 mg via ORAL
  Filled 2014-05-25 (×2): qty 1

## 2014-05-25 MED ORDER — OXYCODONE HCL 5 MG PO TABS: 5.0000 mg | ORAL_TABLET | ORAL | Status: DC | PRN

## 2014-05-25 MED ORDER — SODIUM CHLORIDE 0.9 % IV SOLN
INTRAVENOUS | Status: DC
  Administered 2014-05-26: 04:00:00 via INTRAVENOUS

## 2014-05-25 MED ORDER — ONDANSETRON HCL 4 MG PO TABS: 4.0000 mg | ORAL_TABLET | Freq: Four times a day (QID) | ORAL | Status: DC | PRN

## 2014-05-25 MED ORDER — HYDROMORPHONE HCL 1 MG/ML IJ SOLN: 0.5000 mg | INTRAMUSCULAR | Status: DC | PRN

## 2014-05-25 MED ORDER — AMIODARONE HCL 200 MG PO TABS
200.0000 mg | ORAL_TABLET | Freq: Every day | ORAL | Status: DC
  Administered 2014-05-26 – 2014-05-27 (×2): 200 mg via ORAL
  Filled 2014-05-25 (×2): qty 1

## 2014-05-25 MED ORDER — SODIUM CHLORIDE 0.9 % IJ SOLN
3.0000 mL | Freq: Two times a day (BID) | INTRAMUSCULAR | Status: DC
  Administered 2014-05-25 – 2014-05-27 (×4): 3 mL via INTRAVENOUS

## 2014-05-25 MED ORDER — ACETAMINOPHEN 650 MG RE SUPP: 650.0000 mg | Freq: Four times a day (QID) | RECTAL | Status: DC | PRN

## 2014-05-25 MED ORDER — PANTOPRAZOLE SODIUM 40 MG IV SOLR
40.0000 mg | Freq: Two times a day (BID) | INTRAVENOUS | Status: DC
  Administered 2014-05-26 – 2014-05-27 (×4): 40 mg via INTRAVENOUS
  Filled 2014-05-25 (×5): qty 40

## 2014-05-25 MED ORDER — SODIUM CHLORIDE 0.9 % IV SOLN
Freq: Once | INTRAVENOUS | Status: AC
  Administered 2014-05-25: 21:00:00 via INTRAVENOUS

## 2014-05-25 MED ORDER — ONDANSETRON HCL 4 MG/2ML IJ SOLN: 4.0000 mg | Freq: Four times a day (QID) | INTRAMUSCULAR | Status: DC | PRN

## 2014-05-25 MED ORDER — ACETAMINOPHEN 325 MG PO TABS: 650.0000 mg | ORAL_TABLET | Freq: Four times a day (QID) | ORAL | Status: DC | PRN

## 2014-05-25 MED ORDER — VITAMIN K1 10 MG/ML IJ SOLN: 1.0000 mg | Freq: Once | INTRAMUSCULAR | Status: DC

## 2014-05-25 MED ORDER — ALUM & MAG HYDROXIDE-SIMETH 200-200-20 MG/5ML PO SUSP: 30.0000 mL | Freq: Four times a day (QID) | ORAL | Status: DC | PRN

## 2014-05-25 NOTE — ED Notes (Signed)
MD Pfeiffer at the bedside 

## 2014-05-25 NOTE — Progress Notes (Signed)
Received pt report from Hannah,RN-ED. 

## 2014-05-25 NOTE — ED Notes (Signed)
Patient denies, itching, chest pain, or any new symptoms since the beginning of blood administration.

## 2014-05-25 NOTE — H&P (Signed)
[name] Admission History and Physical       Omar Chan ZOX:[id number] DOB: Dec 13, 1925 DOA: 05/25/2014  Referring physician:   EDP PCP: Mickie Hillier, MD  Specialists:   Chief Complaint: Weakness and Fatigue  HPI: Omar Chan is a 80 y.o. male with a history of Atrial fibrillation, on coumadin Rx, HTN , Hyperlipidemia who presents to the ED with complaints of 2-3 weeks of worsening Fatigue and weakness and Dyspnea on Exertion.  He was evaluated by his PCP and sent to the ED after results of his blood-work revealed Anemia with a Hemoglobin in the 7's, whereas he was previously 11.    He was evalauted in the ED and was found to have a FOBT which was HEME positive, and his INR was elevated at 3.93.  He denies having any nausea or vomiting or diarrhea.  He does report having black stools for the past 3 weeks but he had attributed this to his iron supplement.      Review of Systems:  Constitutional: No Weight Loss, No Weight Gain, Night Sweats, Fevers, Chills, Dizziness, +Fatigue,  +Generalized Weakness HEENT: No Headaches, Difficulty Swallowing,Tooth/Dental Problems,Sore Throat,  No Sneezing, Rhinitis, Ear Ache, Nasal Congestion, or Post Nasal Drip,  Cardio-vascular:  No Chest pain, Orthopnea, PND, Edema in Lower Extremities, Anasarca, Dizziness, Palpitations  Resp: No Dyspnea, +DOE, No Cough, No Hemoptysis, No Wheezing.    GI: No Heartburn, Indigestion, Abdominal Pain, Nausea, Vomiting, Diarrhea, Hematemesis, Hematochezia, +Melena, Change in Bowel Habits,  Loss of Appetite  GU: No Dysuria, Change in Color of Urine, No Urgency or Frequency, No Flank pain.  Musculoskeletal: No Joint Pain or Swelling, No Decreased Range of Motion, No Back Pain.  Neurologic: No Syncope, No Seizures, Muscle Weakness, Paresthesia, Vision Disturbance or Loss, No Diplopia, No Vertigo, No Difficulty Walking,  Skin: No Rash or Lesions. Psych: No Change in Mood or Affect, No Depression or  Anxiety, No Memory loss, No Confusion, or Hallucinations   Past Medical History  Diagnosis Date  . Paroxysmal atrial fibrillation     cardioversion 01/25/10  . Diabetes mellitus   . Exogenous obesity   . Hypertension   . Microcytic anemia   . Hyperlipidemia   . GERD (gastroesophageal reflux disease)   . Hiatal hernia   . Osteoarthritis       Past Surgical History  Procedure Laterality Date  . Total knee arthroplasty      B  . Cardioversion  01/25/10  . Cardiovascular stress test  2007    No ischemia. EF 61%  . US echocardiography  February 2011    Normal EF; LAE and RVE  . Cardioversion N/A 08/19/2013    Procedure: CARDIOVERSION;  Surgeon: Cassell Clement, MD;  Location: [facility] ENDOSCOPY;  Service: Cardiovascular;  Laterality: N/A;       Prior to Admission medications   Medication Sig Start Date End Date Taking? Authorizing Provider  acetaminophen (TYLENOL) 500 MG tablet Take 1,000 mg by mouth every 6 (six) hours as needed. For pain   Yes Historical Provider, MD  amiodarone (PACERONE) 200 MG tablet Take 1 tablet (200 mg total) by mouth daily. 12/01/13  Yes Cassell Clement, MD  atorvastatin (LIPITOR) 80 MG tablet Take 0.5 tablets (40 mg total) by mouth every morning. 10/08/13  Yes Marinus Maw, MD  furosemide (LASIX) 40 MG tablet Take 80 mg by mouth daily.  02/11/14  Yes Historical Provider, MD  gemfibrozil (LOPID) 600 MG tablet 2 (two) times daily. 07/20/13  Yes Historical  Provider, MD  lisinopril (PRINIVIL,ZESTRIL) 40 MG tablet Take 20 mg by mouth every morning.    Yes Historical Provider, MD  metFORMIN (GLUCOPHAGE) 500 MG tablet Take 500-1,000 mg by mouth daily. 1 in the am and 2 in the pm   Yes Historical Provider, MD  metolazone (ZAROXOLYN) 5 MG tablet ONE TABLET ON Monday AND Thursday ONLY OR AS DIRECTED 04/20/14  Yes Cassell Clementhomas Brackbill, MD  warfarin (COUMADIN) 5 MG tablet Take 2.5-5 mg by mouth daily. Monday Wednesday Friday patient takes 2.5 mg all other days is 5 mg 09/14/13   Yes [name], PA-C      Allergies  Allergen Reactions  . Aspirin Hives     Social History:  reports that he quit smoking about 32 years ago. He does not have any smokeless tobacco history on file. He reports that he does not drink alcohol or use illicit drugs.     Family History  Problem Relation Age of Onset  . Cancer Mother     deceased  . Pneumonia Father     deceased  . ALS Brother     deceased  . Cancer Brother     deceased/bone ca  . Cancer Sister     deceased       Physical Exam:  GEN:  Pleasant Obese Elderly 80 y.o. Caucasian male examined and in no acute distress; cooperative with exam Filed Vitals:   05/25/14 2205 05/25/14 2215 05/25/14 2230 05/25/14 2245  BP: 134/55 134/56 148/56 130/50  Pulse:  61 60 57  Temp: 97.8 F (36.6 C)     TempSrc: Oral     Resp:  16 17 20   SpO2:  96% 97% 96%   Blood pressure 130/50, pulse 57, temperature 97.8 F (36.6 C), temperature source Oral, resp. rate 20, SpO2 96.00%. PSYCH: He is alert and oriented x4; does not appear anxious does not appear depressed; affect is normal HEENT: Normocephalic and Atraumatic, Mucous membranes pink; PERRLA; EOM intact; Fundi:  Benign;  No scleral icterus, Nares: Patent, Oropharynx: Clear,     Neck:  FROM, No Cervical Lymphadenopathy nor Thyromegaly or Carotid Bruit; No JVD; Breasts:: Not examined CHEST WALL: No tenderness CHEST: Normal respiration, clear to auscultation bilaterally HEART: Regular rate and rhythm; no murmurs rubs or gallops BACK: No kyphosis or scoliosis; No CVA tenderness ABDOMEN: Positive Bowel Sounds, Obese, Soft Non-Tender; No Masses, No Organomegaly. Rectal Exam: Not done EXTREMITIES: No Cyanosis, Clubbing, or Edema; No Ulcerations. Genitalia: not examined PULSES: 2+ and symmetric SKIN: Normal hydration no rash or ulceration CNS:  Alert and Oriented x 4, No focal Deficits  Vascular: pulses palpable throughout    Labs on Admission:  Basic Metabolic  Panel:  Recent Labs Lab 05/25/14 1912  NA 142  K 4.5  CL 103  CO2 25  GLUCOSE 226*  BUN 50*  CREATININE 1.83*  CALCIUM 8.7   Liver Function Tests:  Recent Labs Lab 05/25/14 1912  AST 13  ALT 10  ALKPHOS 70  BILITOT <0.2*  PROT 6.4  ALBUMIN 3.2*   No results found for this basename: LIPASE, AMYLASE,  in the last 168 hours No results found for this basename: AMMONIA,  in the last 168 hours CBC:  Recent Labs Lab 05/25/14 1912  WBC 5.8  HGB 7.5*  HCT 24.1*  MCV 89.9  PLT 311   Cardiac Enzymes: No results found for this basename: CKTOTAL, CKMB, CKMBINDEX, TROPONINI,  in the last 168 hours  BNP (last 3 results) No results found  for this basename: PROBNP,  in the last 8760 hours CBG: No results found for this basename: GLUCAP,  in the last 168 hours  Radiological Exams on Admission: No results found.   EKG: Independently reviewed. 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 male with   Principal Problem:   1.    GI bleed   Reverse Coumadin and discontinue Coumadin   Transfuse 2 units   Monitor H/Hs   IV Protonix   GI consult in AM     Active Problems:   2.     Anemia- Due to #1   Monitor H/Hs        3.     Warfarin-induced coagulopathy   Reversing coumadin, Vitamin K given PO x1   Monitor PT/INR daily     4.     Paroxysmal a-fib   Monitor on Telemetry   Continue Amiodarone Rx     5.     Diabetes mellitus   SSI coverage   Hold Metformin Rx   Check HbA1C level     6.     Hypertension   Holding Lasix, Metolazone, and Lisinopril Rx due to Hypotension   PRN IV Hydralazine for SBP > 160      7.     Dyslipidemia   Continue Atorvastatin and Gemfibrizil Rx     8.     DVT Prophylaxis    SCDs due to GI Bleeding      Code Status:      DO NOT RESUSCITATE (DNR) Family Communication:    Wife and Daughter at Bedside Disposition Plan:    Inpatient       Time spent:  6460 Minutes  Ron ParkerJENKINS,Brocha Gilliam C [name] Pager [phone]   If 7AM -7PM Please Contact the Day Rounding Team MD for [name]  If 7PM-7AM, Please Contact Night-Floor Coverage  [email or web address] Password [email or web address] 05/25/2014, 11:06 PM

## 2014-05-25 NOTE — ED Notes (Signed)
Per pt and family pt weakness x 1 week and last week coumadin was increased. sts also BP has been low and hgb was around 7 per PCP. Denies pain. sts some SOB with exertion.

## 2014-05-25 NOTE — ED Provider Notes (Signed)
CSN: [id number]     Arrival date & time 05/25/14  1807 History   First MD Initiated Contact with Patient 05/25/14 1847     Chief Complaint  Patient presents with  . Weakness  . low hgb      (Consider location/radiation/quality/duration/timing/severity/associated sxs/prior Treatment) HPI The patient is sent to the emergency department by recommendation of his family physician for anemia. The patient had a lab draw done today and reportedly had a hemoglobin around 7 mg/dL. He has been developing symptoms over the past week of exertional shortness of breath, extreme fatigue and weakness. His wife reports that he has basically been sleeping or sitting and with any kind of exertion he is too exhausted to proceed with other activities. They report he has had anemia in the past and that it has never been identified exactly where he is losing blood. He is on Coumadin. He has a history of H of fibrillation. The patient has been transfused in the past. He denies chest pain. He denies recent fever. No vomiting or diarrhea.  Past Medical History  Diagnosis Date  . Paroxysmal atrial fibrillation     cardioversion 01/25/10  . Diabetes mellitus   . Exogenous obesity   . Hypertension   . Microcytic anemia   . Hyperlipidemia   . GERD (gastroesophageal reflux disease)   . Hiatal hernia   . Osteoarthritis    Past Surgical History  Procedure Laterality Date  . Total knee arthroplasty      B  . Cardioversion  01/25/10  . Cardiovascular stress test  2007    No ischemia. EF 61%  . [place] echocardiography  February 2011    Normal EF; LAE and RVE  . Cardioversion N/A 08/19/2013    Procedure: CARDIOVERSION;  Surgeon: Cassell Clementhomas Brackbill, MD;  [facility];  Service: Cardiovascular;  Laterality: N/A;   Family History  Problem Relation Age of Onset  . Cancer Mother     deceased  . Pneumonia Father     deceased  . ALS Brother     deceased  . Cancer Brother     deceased/bone ca  . Cancer Sister      deceased   History  Substance Use Topics  . Smoking status: Former Smoker    Quit date: 07/29/1981  . Smokeless tobacco: Not on file  . Alcohol Use: No    Review of Systems 10 Systems reviewed and are negative for acute change except as noted in the HPI.   Allergies  Aspirin  Home Medications   Prior to Admission medications   Medication Sig Start Date End Date Taking? Authorizing Provider  acetaminophen (TYLENOL) 500 MG tablet Take 1,000 mg by mouth every 6 (six) hours as needed. For pain   Yes Historical Provider, MD  amiodarone (PACERONE) 200 MG tablet Take 1 tablet (200 mg total) by mouth daily. 12/01/13  Yes Cassell Clementhomas Brackbill, MD  atorvastatin (LIPITOR) 80 MG tablet Take 0.5 tablets (40 mg total) by mouth every morning. 10/08/13  Yes Marinus MawGregg W Taylor, MD  furosemide (LASIX) 40 MG tablet Take 80 mg by mouth daily.  02/11/14  Yes Historical Provider, MD  gemfibrozil (LOPID) 600 MG tablet 2 (two) times daily. 07/20/13  Yes Historical Provider, MD  lisinopril (PRINIVIL,ZESTRIL) 40 MG tablet Take 20 mg by mouth every morning.    Yes Historical Provider, MD  metFORMIN (GLUCOPHAGE) 500 MG tablet Take 500-1,000 mg by mouth daily. 1 in the am and 2 in the pm   Yes Historical  Provider, MD  metolazone (ZAROXOLYN) 5 MG tablet ONE TABLET ON Monday AND Thursday ONLY OR AS DIRECTED 04/20/14  Yes Cassell Clement, MD  warfarin (COUMADIN) 5 MG tablet Take 2.5-5 mg by mouth daily. Monday Wednesday Friday patient takes 2.5 mg all other days is 5 mg 09/14/13  Yes [name], PA-C   BP 134/55  Pulse 57  Temp(Src) 97.8 F (36.6 C) (Oral)  Resp 15  SpO2 97% Physical Exam  Constitutional: He is oriented to person, place, and time.  The patient is obese but alert and ambulatory. He is nontoxic. He is able to transfer from wheelchair to stretcher. Patient is not in acute respiratory distress.  HENT:  Head: Normocephalic and atraumatic.  Eyes: EOM are normal. Pupils are equal, round, and  reactive to light.  Neck: Neck supple.  Cardiovascular: Normal rate and intact distal pulses.   Murmur heard. Pulmonary/Chest: Effort normal and breath sounds normal.  At this time there are no acute rails.  Abdominal: Soft. Bowel sounds are normal.  Genitourinary: Guaiac positive stool.  Rectal exam shows soft brown stool. There is no melena or bright red blood.  Musculoskeletal: He exhibits edema. He exhibits no tenderness.  Patient has trace to 1+ pitting edema bilateral lower extremities.  Neurological: He is alert and oriented to person, place, and time. Coordination normal.  Skin: Skin is warm and dry.  Psychiatric: He has a normal mood and affect.    ED Course  Procedures (including critical care time) Labs Review Labs Reviewed  PROTIME-INR - Abnormal; Notable for the following:    Prothrombin Time 38.7 (*)    INR 3.93 (*)    All other components within normal limits  CBC - Abnormal; Notable for the following:    RBC 2.68 (*)    Hemoglobin 7.5 (*)    HCT 24.1 (*)    All other components within normal limits  COMPREHENSIVE METABOLIC PANEL - Abnormal; Notable for the following:    Glucose, Bld 226 (*)    BUN 50 (*)    Creatinine, Ser 1.83 (*)    Albumin 3.2 (*)    Total Bilirubin <0.2 (*)    GFR calc non Af Amer 32 (*)    GFR calc Af Amer 37 (*)    All other components within normal limits  POC OCCULT BLOOD, ED - Abnormal; Notable for the following:    Fecal Occult Bld POSITIVE (*)    All other components within normal limits  TYPE AND SCREEN  PREPARE RBC (CROSSMATCH)    Imaging Review No results found.   EKG Interpretation   Date/Time:  Wednesday May 25 2014 18:16:49 EDT Ventricular Rate:  76 PR Interval:  232 QRS Duration: 84 QT Interval:  406 QTC Calculation: 456 R Axis:   -14 Text Interpretation:  Sinus rhythm with 1st degree A-V block Possible  Inferior infarct , age undetermined Possible Anterior infarct , age  undetermined Abnormal ECG  AGREE, NO STEMI Confirmed by Donnald Garre, MD, Lebron Conners  [phone]) on 05/25/2014 9:32:14 PM     CRITICAL CARE Performed by: Arby Barrette   Total critical care time: 30  Critical care time was exclusive of separately billable procedures and treating other patients.  Critical care was necessary to treat or prevent imminent or life-threatening deterioration.  Critical care was time spent personally by me on the following activities: development of treatment plan with patient and/or surrogate as well as nursing, discussions with consultants, evaluation of patient's response to treatment, examination of patient,  obtaining history from patient or surrogate, ordering and performing treatments and interventions, ordering and review of laboratory studies, ordering and review of radiographic studies, pulse oximetry and re-evaluation of patient's condition. MDM   Final diagnoses:  Anemia due to blood loss  Anticoagulated on Coumadin  Heme positive stool   The patient has been developing weakness and shortness of breath. It is are consistent with findings of anemia. At this point time the stool is positive however he does not appear to be actively bleeding as a stool is brown in color and not black or melanotic. Patient has a prior history of anemia with need for transfusion. At this point transfusion will be initiated with admission for monitoring, blood transfusion and further evaluation for bleeding source.    Arby BarretteMarcy Akyra Bouchie, MD 05/25/14 2223

## 2014-05-26 DIAGNOSIS — D500 Iron deficiency anemia secondary to blood loss (chronic): Secondary | ICD-10-CM

## 2014-05-26 LAB — CBC
HCT: 28.5 % — ABNORMAL LOW (ref 39.0–52.0)
Hemoglobin: 9.3 g/dL — ABNORMAL LOW (ref 13.0–17.0)
MCH: 28.6 pg (ref 26.0–34.0)
MCHC: 32.6 g/dL (ref 30.0–36.0)
MCV: 87.7 fL (ref 78.0–100.0)
Platelets: 247 10*3/uL (ref 150–400)
RBC: 3.25 MIL/uL — ABNORMAL LOW (ref 4.22–5.81)
RDW: 15 % (ref 11.5–15.5)
WBC: 5.5 10*3/uL (ref 4.0–10.5)

## 2014-05-26 LAB — HEMOGLOBIN A1C
Hgb A1c MFr Bld: 7 % — ABNORMAL HIGH (ref <5.7)
MEAN PLASMA GLUCOSE: 154 mg/dL — AB (ref <117)

## 2014-05-26 LAB — BASIC METABOLIC PANEL
ANION GAP: 15 (ref 5–15)
BUN: 36 mg/dL — ABNORMAL HIGH (ref 6–23)
CALCIUM: 8.7 mg/dL (ref 8.4–10.5)
CO2: 23 mEq/L (ref 19–32)
Chloride: 104 mEq/L (ref 96–112)
Creatinine, Ser: 1.14 mg/dL (ref 0.50–1.35)
GFR, EST AFRICAN AMERICAN: 65 mL/min — AB (ref >90)
GFR, EST NON AFRICAN AMERICAN: 56 mL/min — AB (ref >90)
Glucose, Bld: 115 mg/dL — ABNORMAL HIGH (ref 70–99)
Potassium: 4 mEq/L (ref 3.7–5.3)
SODIUM: 142 meq/L (ref 137–147)

## 2014-05-26 LAB — GLUCOSE, CAPILLARY
GLUCOSE-CAPILLARY: 176 mg/dL — AB (ref 70–99)
GLUCOSE-CAPILLARY: 119 mg/dL — AB (ref 70–99)
GLUCOSE-CAPILLARY: 96 mg/dL (ref 70–99)
Glucose-Capillary: 124 mg/dL — ABNORMAL HIGH (ref 70–99)
Glucose-Capillary: 116 mg/dL — ABNORMAL HIGH (ref 70–99)
Glucose-Capillary: 156 mg/dL — ABNORMAL HIGH (ref 70–99)
Glucose-Capillary: 95 mg/dL (ref 70–99)

## 2014-05-26 LAB — HEMOGLOBIN AND HEMATOCRIT, BLOOD
HCT: 27.2 % — ABNORMAL LOW (ref 39.0–52.0)
HEMATOCRIT: 26.3 % — AB (ref 39.0–52.0)
HEMOGLOBIN: 8.5 g/dL — AB (ref 13.0–17.0)
Hemoglobin: 8.8 g/dL — ABNORMAL LOW (ref 13.0–17.0)

## 2014-05-26 LAB — PROTIME-INR
INR: 1.55 — AB (ref 0.00–1.49)
PROTHROMBIN TIME: 18.8 s — AB (ref 11.6–15.2)

## 2014-05-26 MED ORDER — DIPHENHYDRAMINE HCL 12.5 MG/5ML PO ELIX
12.5000 mg | ORAL_SOLUTION | Freq: Once | ORAL | Status: AC
  Administered 2014-05-26: 12.5 mg via ORAL
  Filled 2014-05-26: qty 5

## 2014-05-26 MED ORDER — ZOLPIDEM TARTRATE 5 MG PO TABS
5.0000 mg | ORAL_TABLET | Freq: Once | ORAL | Status: AC
  Administered 2014-05-26: 5 mg via ORAL
  Filled 2014-05-26: qty 1

## 2014-05-26 MED ORDER — INSULIN ASPART 100 UNIT/ML ~~LOC~~ SOLN
0.0000 [IU] | SUBCUTANEOUS | Status: DC
  Administered 2014-05-26: 1 [IU] via SUBCUTANEOUS
  Administered 2014-05-26 – 2014-05-27 (×3): 2 [IU] via SUBCUTANEOUS

## 2014-05-26 NOTE — Progress Notes (Signed)
MD notified via amion of hgb results.

## 2014-05-26 NOTE — Progress Notes (Signed)
Omar Chan [id number] Admission Data: 05/26/2014 1:34 AM Attending Provider: Ron ParkerHarvette C Jenkins, MD WUJ:WJXBJY,NWGNFPCP:LITTLE,KEVIN Juel BurrowLORNE, MD Code Status: DNR  Omar Chan is a 80 y.o. male patient admitted from ED:  -No acute distress noted.  -No complaints of shortness of breath.  -No complaints of chest pain.   Cardiac Monitoring: Box # 5in place. Cardiac monitor yields:normal sinus rhythm.  Blood pressure 124/44, pulse 62, temperature 97.5 F (36.4 C), temperature source Oral, resp. rate 15, height 5\' 11"  (1.803 m), weight 114.7 kg (252 lb 13.9 oz), SpO2 96.00%.   IV Fluids:  IV in place, occlusive dsg intact without redness, IV cath antecubital right, condition patent and no redness normal saline.   Allergies:  Aspirin  Past Medical History:   has a past medical history of Paroxysmal atrial fibrillation; Diabetes mellitus; Exogenous obesity; Hypertension; Microcytic anemia; Hyperlipidemia; GERD (gastroesophageal reflux disease); Hiatal hernia; and Osteoarthritis.  Past Surgical History:   has past surgical history that includes Total knee arthroplasty; Cardioversion (01/25/10); Cardiovascular stress test (2007); us echocardiography (February 2011); and Cardioversion (N/A, 08/19/2013).  Social History:   reports that he quit smoking about 32 years ago. He does not have any smokeless tobacco history on file. He reports that he does not drink alcohol or use illicit drugs.  Skin: NSI  Patient/Family orientated to room. Information packet given to patient/family. Admission inpatient armband information verified with patient/family to include name and date of birth and placed on patient arm. Side rails up x 2, fall assessment and education completed with patient/family. Patient/family able to verbalize understanding of risk associated with falls and verbalized understanding to call for assistance before getting out of bed. Call light within reach. Patient/family able to voice and demonstrate  understanding of unit orientation instructions.

## 2014-05-26 NOTE — Care Management Note (Signed)
    Page 1 of 1   05/27/2014     2:48:13 PM CARE MANAGEMENT NOTE 05/27/2014  Patient:  Ina KickALL,Trek D   Account Number:  [id number]  Date Initiated:  05/26/2014  Documentation initiated by:  Letha CapeAYLOR,Finnigan Warriner  Subjective/Objective Assessment:   dx gib  admit- lives with spouse.     Action/Plan:   Anticipated DC Date:  05/27/2014   Anticipated DC Plan:  HOME W HOME HEALTH SERVICES      DC Planning Services  CM consult      Choice offered to / List presented to:             Status of service:  Completed, signed off Medicare Important Message given?  YES (If response is "NO", the following Medicare IM given date fields will be blank) Date Medicare IM given:  05/27/2014 Medicare IM given by:  Letha CapeAYLOR,Anavictoria Wilk Date Additional Medicare IM given:   Additional Medicare IM given by:    Discharge Disposition:  HOME/SELF CARE  Per UR Regulation:  Reviewed for med. necessity/level of care/duration of stay  If discussed at Long Length of Stay Meetings, dates discussed:    Comments:  05/26/14 1129 Letha Capeeborah Quanna Wittke RN, BSN [phone] [id number] patient lives with spouse, NCM will continue to follor for dc needs.

## 2014-05-26 NOTE — Progress Notes (Signed)
[name] PROGRESS NOTE  Omar Chan [id number] DOB: 07-Oct-1925 DOA: 05/25/2014 PCP: [name],Omar LORNE, MD  Assessment/Plan:  Principal Problem:   GI bleed - Will continue to monitor patient's hgb levels - If hgb level below 8.0 will plan on transfusion - GI consulted  Active Problems:   Diabetes mellitus - Pt on SSI    Hypertension - stable off antihypertensive medications    Dyslipidemia - no chest pain - continue statin    Warfarin-induced coagulopathy - hold warfarin given persistent GI bleed    Paroxysmal a-fib - Hold warfarin as patient has high risk of bleeding out on the medication rather than receiving benefit from stroke prevention discussed with family who is Omar agreement -Continue amiodarone  Code Status: DNR Family Communication: discussed with wife at bedside Disposition Plan: Pending improvement Omar condition.   Consultants:  Gastroenterologist  Procedures:  None  Antibiotics:  None  HPI/Subjective: Still having black tarry stools  Objective: Filed Vitals:   05/26/14 1337  BP: 130/47  Pulse: 66  Temp: 98.4 F (36.9 C)  Resp: 16    Intake/Output Summary (Last 24 hours) at 05/26/14 1842 Last data filed at 05/26/14 1437  Gross per 24 hour  Intake 1364.17 ml  Output      1 ml  Net 1363.17 ml   Filed Weights   05/25/14 2313  Weight: 114.7 kg (252 lb 13.9 oz)    Exam:   General:  Pt Omar nad, alert and awake  Cardiovascular: s1 and s2 present, no murmurs  Respiratory: cta bl, no wheezes  Abdomen: soft, NT, obese  Musculoskeletal: no cyanosis or clubbing   Data Reviewed: Basic Metabolic Panel:  Recent Labs Lab 05/25/14 1912 05/26/14 1254  NA 142 142  K 4.5 4.0  CL 103 104  CO2 25 23  GLUCOSE 226* 115*  BUN 50* 36*  CREATININE 1.83* 1.14  CALCIUM 8.7 8.7   Liver Function Tests:  Recent Labs Lab 05/25/14 1912  AST 13  ALT 10  ALKPHOS 70  BILITOT <0.2*  PROT 6.4  ALBUMIN 3.2*   No  results found for this basename: LIPASE, AMYLASE,  Omar the last 168 hours No results found for this basename: AMMONIA,  Omar the last 168 hours CBC:  Recent Labs Lab 05/25/14 1912 05/26/14 1254 05/26/14 1520  WBC 5.8 5.5  --   HGB 7.5* 9.3* 8.5*  HCT 24.1* 28.5* 26.3*  MCV 89.9 87.7  --   PLT 311 247  --    Cardiac Enzymes: No results found for this basename: CKTOTAL, CKMB, CKMBINDEX, TROPONINI,  Omar the last 168 hours BNP (last 3 results) No results found for this basename: PROBNP,  Omar the last 8760 hours CBG:  Recent Labs Lab 05/26/14 0157 05/26/14 0402 05/26/14 0743 05/26/14 1153 05/26/14 1643  GLUCAP 176* 119* 124* 116* 156*    No results found for this or any previous visit (from the past 240 hour(s)).   Studies: No results found.  Scheduled Meds: . sodium chloride   Intravenous Once  . amiodarone  200 mg Oral Daily  . atorvastatin  40 mg Oral q morning - 10a  . insulin aspart  0-9 Units Subcutaneous 6 times per day  . pantoprazole (PROTONIX) IV  40 mg Intravenous Q12H  . sodium chloride  3 mL Intravenous Q12H   Continuous Infusions: . sodium chloride 50 mL/hr at 05/26/14 0347     Time spent: > 35 minutes    Penny PiaVEGA, Vasilisa Vore  [name] Pager [phone]  If 7PM-7AM, please contact night-coverage at [email or web address], password [email or web address] 05/26/2014, 6:42 PM  LOS: 1 day

## 2014-05-26 NOTE — Progress Notes (Signed)
INITIAL NUTRITION ASSESSMENT  DOCUMENTATION CODES Per approved criteria  -Obesity Unspecified   INTERVENTION: Diet advancement per MD If patient remains on clear liquids >48 hours add 30 ml Pro-stat TID and Resource Breeze BID RD to continue to monitor  NUTRITION DIAGNOSIS: Inadequate oral intake related to GI Bleed as evidenced by clear liquid diet.   Goal: Pt to meet >/= 90% of their estimated nutrition needs   Monitor:  Diet advancement, PO intake, weight trend, labs  Reason for Assessment: Malnutrition Screening Tool, score of 4  80 y.o. male  Admitting Dx: GI bleed  ASSESSMENT: 80 y.o. male with a history of Atrial fibrillation, on coumadin Rx, HTN , Hyperlipidemia who presents to the ED with complaints of 2-3 weeks of worsening Fatigue and weakness and Dyspnea on Exertion. He was evaluated by his PCP and sent to the ED after results of his blood-work revealed Anemia with a Hemoglobin in the 7's, whereas he was previously 11. He was evalauted in the ED and was found to have a FOBT which was HEME positive, and his INR was elevated at 3.93.   Pt reports that he usually weighs 262 lbs. His wife reports that he has lost 6 lbs from MD appointment 6 weeks ago- 2.4% weight loss not significant for time frame. Pt reports that he was eating normally PTA and he has a good appetite today. He reports losing some weight over time due to decreased appetite as he has aged. Pt denies any nausea or pain after consuming clear liquids today. No evidence of muscle or fat wasting.  Labs: low hemoglobin, glucose ranging 52 to 226 mg/dL  Height: Ht Readings from Last 1 Encounters:  05/25/14 5\' 11"  (1.803 m)    Weight: Wt Readings from Last 1 Encounters:  05/25/14 252 lb 13.9 oz (114.7 kg)    Ideal Body Weight: 172 lbs  % Ideal Body Weight: 146%  Wt Readings from Last 10 Encounters:  05/25/14 252 lb 13.9 oz (114.7 kg)  04/19/14 266 lb (120.657 kg)  12/01/13 263 lb (119.296 kg)   10/08/13 259 lb (117.482 kg)  09/14/13 254 lb 6.6 oz (115.4 kg)  09/07/13 258 lb (117.028 kg)  08/10/13 264 lb 12.8 oz (120.112 kg)  08/04/13 262 lb 12.8 oz (119.205 kg)  04/13/12 278 lb (126.1 kg)  04/01/12 278 lb (126.1 kg)    Usual Body Weight: 262 lbs  % Usual Body Weight: 96%  BMI:  Body mass index is 35.28 kg/(m^2). (Obesity)  Estimated Nutritional Needs: Kcal: 2000-2200 Protein: 100-115 grams Fluid: 2-2.2 L/day  Skin: intact  Diet Order: Clear Liquid  EDUCATION NEEDS: -No education needs identified at this time   Intake/Output Summary (Last 24 hours) at 05/26/14 1627 Last data filed at 05/26/14 1437  Gross per 24 hour  Intake 1364.17 ml  Output      1 ml  Net 1363.17 ml    Last BM: 10/28   Labs:   Recent Labs Lab 05/25/14 1912 05/26/14 1254  NA 142 142  K 4.5 4.0  CL 103 104  CO2 25 23  BUN 50* 36*  CREATININE 1.83* 1.14  CALCIUM 8.7 8.7  GLUCOSE 226* 115*    CBG (last 3)   Recent Labs  05/26/14 0402 05/26/14 0743 05/26/14 1153  GLUCAP 119* 124* 116*    Scheduled Meds: . sodium chloride   Intravenous Once  . amiodarone  200 mg Oral Daily  . atorvastatin  40 mg Oral q morning - 10a  . insulin  aspart  0-9 Units Subcutaneous 6 times per day  . pantoprazole (PROTONIX) IV  40 mg Intravenous Q12H  . sodium chloride  3 mL Intravenous Q12H    Continuous Infusions: . sodium chloride 50 mL/hr at 05/26/14 0347    Past Medical History  Diagnosis Date  . Paroxysmal atrial fibrillation     cardioversion 01/25/10  . Diabetes mellitus   . Exogenous obesity   . Hypertension   . Microcytic anemia   . Hyperlipidemia   . GERD (gastroesophageal reflux disease)   . Hiatal hernia   . Osteoarthritis     Past Surgical History  Procedure Laterality Date  . Total knee arthroplasty      B  . Cardioversion  01/25/10  . Cardiovascular stress test  2007    No ischemia. EF 61%  . [place] echocardiography  February 2011    Normal EF; LAE and RVE   . Cardioversion N/A 08/19/2013    Procedure: CARDIOVERSION;  Surgeon: Cassell Clementhomas Brackbill, MD;  [facility];  Service: Cardiovascular;  Laterality: N/A;    Ian Malkineanne Barnett RD, LDN Inpatient Clinical Dietitian Pager: [phone] After Hours Pager: [phone]

## 2014-05-26 NOTE — Consult Note (Signed)
[name] Reason for consult: G.I. Bleeding Referring Physician:  [name]. PCP: Dr. [name]. 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  HPI: he has been followed by my partner Dr. [name] for several years. He has been worktop in the past for anemia with last procedures 3/10. EGD revealed large hiatal hernia no other lesions. 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 Patient is on chronic anticoagulation for atrial fib. He had been having increasing fatigue and weakness and was found to have hemoglobin around 7 with stool positive for FOB. INR was elevated at 3.9. The patient denies the use of any nonsteroidal's, abdominal pain, nausea etc. He is not seen any melena clearly it does take iron chronically so his stools are always dark. He is not had a medication or abdominal pain. He has been told that he is too old for colonoscopies endoscopies and is not wish to have these procedures unless absolutely necessary.  Past Medical History  Diagnosis Date  . Paroxysmal atrial fibrillation     cardioversion 01/25/10  . Diabetes mellitus   . Exogenous obesity   . Hypertension   . Microcytic anemia   . Hyperlipidemia   . GERD (gastroesophageal reflux disease)   . Hiatal hernia   . Osteoarthritis     Past Surgical History  Procedure Laterality Date  . Total knee arthroplasty      B  . Cardioversion  01/25/10  . Cardiovascular stress test  2007    No ischemia. EF 61%  . US echocardiography  February 2011    Normal EF; LAE and RVE  . Cardioversion N/A 08/19/2013    Procedure: CARDIOVERSION;  Surgeon: [name], MD;  Location: [facility] ENDOSCOPY;  Service: Cardiovascular;  Laterality: N/A;    Family History  Problem Relation Age of Onset  . Cancer Mother     deceased  . Pneumonia Father     deceased  . ALS Brother     deceased  . Cancer Brother     deceased/bone ca  . Cancer Sister     deceased    Social History:   reports that he quit smoking about 32 years ago. He does not have any smokeless tobacco history on file. He reports that he does not drink alcohol or use illicit drugs.  Allergies:  Allergies  Allergen Reactions  . Aspirin Hives    Medications; Prior to Admission medications   Medication Sig Start Date End Date Taking? Authorizing Provider  acetaminophen (TYLENOL) 500 MG tablet Take 1,000 mg by mouth every 6 (six) hours as needed. For pain   Yes Historical Provider, MD  amiodarone (PACERONE) 200 MG tablet Take 1 tablet (200 mg total) by mouth daily. 12/01/13  Yes [name], MD  atorvastatin (LIPITOR) 80 MG tablet Take 0.5 tablets (40 mg total) by mouth every morning. 10/08/13  Yes Evans Lance, MD  furosemide (LASIX) 40 MG tablet Take 80 mg by mouth daily.  02/11/14  Yes Historical Provider, MD  gemfibrozil (LOPID) 600 MG tablet 2 (two) times daily. 07/20/13  Yes Historical Provider, MD  lisinopril (PRINIVIL,ZESTRIL) 40 MG tablet Take 20 mg by mouth every morning.    Yes Historical Provider, MD  metFORMIN (GLUCOPHAGE) 500 MG tablet Take 500-1,000 mg by mouth daily. 1 in the am and 2 in the pm   Yes Historical Provider, MD  metolazone (ZAROXOLYN) 5 MG tablet ONE TABLET ON Monday AND Thursday ONLY OR AS DIRECTED 04/20/14  Yes [name], MD  warfarin (COUMADIN) 5 MG tablet Take 2.5-5 mg by mouth daily. Monday Wednesday Friday patient takes 2.5 mg all other days is 5 mg 09/14/13  Yes [name], PA-C   . sodium chloride   Intravenous Once  . amiodarone  200 mg Oral Daily  . atorvastatin  40 mg Oral q morning - 10a  . insulin aspart  0-9 Units Subcutaneous 6 times per day  . pantoprazole (PROTONIX) IV  40 mg Intravenous Q12H  . sodium chloride  3 mL Intravenous Q12H   PRN Meds acetaminophen, acetaminophen, alum & mag hydroxide-simeth, HYDROmorphone (DILAUDID) injection, ondansetron (ZOFRAN) IV, ondansetron, oxyCODONE Results for orders placed during the hospital encounter of  05/25/14 (from the past 48 hour(s))  PROTIME-INR     Status: Abnormal   Collection Time    05/25/14  7:12 PM      Result Value Ref Range   Prothrombin Time 38.7 (*) 11.6 - 15.2 seconds   INR 3.93 (*) 0.00 - 1.49  CBC     Status: Abnormal   Collection Time    05/25/14  7:12 PM      Result Value Ref Range   WBC 5.8  4.0 - 10.5 K/uL   RBC 2.68 (*) 4.22 - 5.81 MIL/uL   Hemoglobin 7.5 (*) 13.0 - 17.0 g/dL   HCT 24.1 (*) 39.0 - 52.0 %   MCV 89.9  78.0 - 100.0 fL   MCH 28.0  26.0 - 34.0 pg   MCHC 31.1  30.0 - 36.0 g/dL   RDW 14.9  11.5 - 15.5 %   Platelets 311  150 - 400 K/uL  COMPREHENSIVE METABOLIC PANEL     Status: Abnormal   Collection Time    05/25/14  7:12 PM      Result Value Ref Range   Sodium 142  137 - 147 mEq/L   Potassium 4.5  3.7 - 5.3 mEq/L   Chloride 103  96 - 112 mEq/L   CO2 25  19 - 32 mEq/L   Glucose, Bld 226 (*) 70 - 99 mg/dL   BUN 50 (*) 6 - 23 mg/dL   Creatinine, Ser 1.83 (*) 0.50 - 1.35 mg/dL   Calcium 8.7  8.4 - 10.5 mg/dL   Total Protein 6.4  6.0 - 8.3 g/dL   Albumin 3.2 (*) 3.5 - 5.2 g/dL   AST 13  0 - 37 U/L   ALT 10  0 - 53 U/L   Alkaline Phosphatase 70  39 - 117 U/L   Total Bilirubin <0.2 (*) 0.3 - 1.2 mg/dL   GFR calc non Af Amer 32 (*) >90 mL/min   GFR calc Af Amer 37 (*) >90 mL/min   Comment: (NOTE)     The eGFR has been calculated using the CKD EPI equation.     This calculation has not been validated in all clinical situations.     eGFR's persistently <90 mL/min signify possible Chronic Kidney     Disease.   Anion gap 14  5 - 15  TYPE AND SCREEN     Status: None   Collection Time    05/25/14  7:15 PM      Result Value Ref Range   ABO/RH(D) O NEG     Antibody Screen NEG     Sample Expiration 05/28/2014     Unit Number [id number]     Blood Component Type RED CELLS,LR     Unit division 00  Status of Unit ISSUED     Transfusion Status OK TO TRANSFUSE     Crossmatch Result Compatible     Unit Number [id number]     Blood  Component Type RED CELLS,LR     Unit division 00     Status of Unit ISSUED,FINAL     Transfusion Status OK TO TRANSFUSE     Crossmatch Result Compatible     Unit Number [id number]     Blood Component Type RED CELLS,LR     Unit division 00     Status of Unit ALLOCATED     Transfusion Status OK TO TRANSFUSE     Crossmatch Result Compatible    POC OCCULT BLOOD, ED     Status: Abnormal   Collection Time    05/25/14  8:27 PM      Result Value Ref Range   Fecal Occult Bld POSITIVE (*) NEGATIVE  PREPARE RBC (CROSSMATCH)     Status: None   Collection Time    05/25/14  8:30 PM      Result Value Ref Range   Order Confirmation ORDER PROCESSED BY BLOOD BANK    PREPARE RBC (CROSSMATCH)     Status: None   Collection Time    05/25/14 11:30 PM      Result Value Ref Range   Order Confirmation ORDER PROCESSED BY BLOOD BANK    GLUCOSE, CAPILLARY     Status: Abnormal   Collection Time    05/26/14  1:57 AM      Result Value Ref Range   Glucose-Capillary 176 (*) 70 - 99 mg/dL  GLUCOSE, CAPILLARY     Status: Abnormal   Collection Time    05/26/14  4:02 AM      Result Value Ref Range   Glucose-Capillary 119 (*) 70 - 99 mg/dL   Comment 1 Documented in Chart     Comment 2 Notify RN    GLUCOSE, CAPILLARY     Status: Abnormal   Collection Time    05/26/14  7:43 AM      Result Value Ref Range   Glucose-Capillary 124 (*) 70 - 99 mg/dL  GLUCOSE, CAPILLARY     Status: Abnormal   Collection Time    05/26/14 11:53 AM      Result Value Ref Range   Glucose-Capillary 116 (*) 70 - 99 mg/dL  PROTIME-INR     Status: Abnormal   Collection Time    05/26/14 12:54 PM      Result Value Ref Range   Prothrombin Time 18.8 (*) 11.6 - 15.2 seconds   INR 1.55 (*) 0.00 - 1.02  BASIC METABOLIC PANEL     Status: Abnormal   Collection Time    05/26/14 12:54 PM      Result Value Ref Range   Sodium 142  137 - 147 mEq/L   Potassium 4.0  3.7 - 5.3 mEq/L   Chloride 104  96 - 112 mEq/L   CO2 23  19 - 32 mEq/L    Glucose, Bld 115 (*) 70 - 99 mg/dL   BUN 36 (*) 6 - 23 mg/dL   Creatinine, Ser 1.14  0.50 - 1.35 mg/dL   Comment: DELTA CHECK NOTED   Calcium 8.7  8.4 - 10.5 mg/dL   GFR calc non Af Amer 56 (*) >90 mL/min   GFR calc Af Amer 65 (*) >90 mL/min   Comment: (NOTE)     The eGFR has been calculated using the CKD EPI equation.  This calculation has not been validated in all clinical situations.     eGFR's persistently <90 mL/min signify possible Chronic Kidney     Disease.   Anion gap 15  5 - 15  CBC     Status: Abnormal   Collection Time    05/26/14 12:54 PM      Result Value Ref Range   WBC 5.5  4.0 - 10.5 K/uL   RBC 3.25 (*) 4.22 - 5.81 MIL/uL   Hemoglobin 9.3 (*) 13.0 - 17.0 g/dL   Comment: POST TRANSFUSION SPECIMEN   HCT 28.5 (*) 39.0 - 52.0 %   MCV 87.7  78.0 - 100.0 fL   MCH 28.6  26.0 - 34.0 pg   MCHC 32.6  30.0 - 36.0 g/dL   RDW 15.0  11.5 - 15.5 %   Platelets 247  150 - 400 K/uL  HEMOGLOBIN AND HEMATOCRIT, BLOOD     Status: Abnormal   Collection Time    05/26/14  3:20 PM      Result Value Ref Range   Hemoglobin 8.5 (*) 13.0 - 17.0 g/dL   HCT 26.3 (*) 39.0 - 52.0 %  GLUCOSE, CAPILLARY     Status: Abnormal   Collection Time    05/26/14  4:43 PM      Result Value Ref Range   Glucose-Capillary 156 (*) 70 - 99 mg/dL    No results found.             Blood pressure 130/47, pulse 66, temperature 98.4 F (36.9 C), temperature source Oral, resp. rate 16, height '5\' 11"'  (1.803 m), weight 114.7 kg (252 lb 13.9 oz), SpO2 100.00%.  Physical exam:   General-- morbidly obese white male in no acute distress .alert and oriented and quite pleasant  Heart-- regular rate and rhythm without murmurs or gallops  Lungs-- clear  Abdomen-- obese but soft and nontender    Assessment: 1. Anemia/heme positive stool. This is occurring with elevated INR. 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 He does not wish to have any  endoscopic procedures unless absolutely necessary. 2. Atrial fib. Chronically anticoagulated may need to address whether this is necessary going forward 3. Multiple other problems including diabetes, hypertension, obesity  Plan: we will follow with you. I would keep him on liquids for now and correct his INR. If he continues to have bleeding consider G.I. bleeding scan. He may consent to endoscopic procedures if he continues to bleed after his INR has been correct   Kacee Sukhu JR,Dreyton Roessner L 05/26/2014, 5:31 PM

## 2014-05-27 LAB — PROTIME-INR
INR: 1.39 (ref 0.00–1.49)
Prothrombin Time: 17.2 seconds — ABNORMAL HIGH (ref 11.6–15.2)

## 2014-05-27 LAB — HEMOGLOBIN AND HEMATOCRIT, BLOOD
HEMATOCRIT: 28.1 % — AB (ref 39.0–52.0)
Hemoglobin: 9 g/dL — ABNORMAL LOW (ref 13.0–17.0)

## 2014-05-27 LAB — GLUCOSE, CAPILLARY
GLUCOSE-CAPILLARY: 118 mg/dL — AB (ref 70–99)
GLUCOSE-CAPILLARY: 112 mg/dL — AB (ref 70–99)
GLUCOSE-CAPILLARY: 171 mg/dL — AB (ref 70–99)
Glucose-Capillary: 112 mg/dL — ABNORMAL HIGH (ref 70–99)

## 2014-05-27 MED ORDER — WARFARIN SODIUM 5 MG PO TABS: 2.5000 mg | ORAL_TABLET | Freq: Every day | ORAL | Status: DC

## 2014-05-27 NOTE — Progress Notes (Signed)
[facility] PROGRESS NOTE Subjective patient without any further bleeding.  Objective: Vital signs in last 24 hours: Temp:  [97.8 F (36.6 C)-98.6 F (37 C)] 98.6 F (37 C) (10/30 0452) Pulse Rate:  [52-66] 65 (10/30 0452) Resp:  [16-20] 18 (10/30 0452) BP: (121-137)/(47-88) 121/71 mmHg (10/30 0452) SpO2:  [95 %-100 %] 95 % (10/30 0452) Last BM Date: 05/26/14  Intake/Output from previous day: 10/29 0701 - 10/30 0700 In: 1110 [P.O.:720; I.V.:390] Out: 1 [Urine:1] Intake/Output this shift:    PE: General--patient up walking around the room in no distress    Lab Results:  Recent Labs  05/25/14 1912 05/26/14 1254 05/26/14 1520 05/26/14 2300 05/27/14 0650  WBC 5.8 5.5  --   --   --   HGB 7.5* 9.3* 8.5* 8.8* 9.0*  HCT 24.1* 28.5* 26.3* 27.2* 28.1*  PLT 311 247  --   --   --    BMET  Recent Labs  05/25/14 1912 05/26/14 1254  NA 142 142  K 4.5 4.0  CL 103 104  CO2 25 23  CREATININE 1.83* 1.14   LFT  Recent Labs  05/25/14 1912  PROT 6.4  AST 13  ALT 10  ALKPHOS 70  BILITOT <0.2*   PT/INR  Recent Labs  05/25/14 1912 05/26/14 1254 05/27/14 0422  LABPROT 38.7* 18.8* 17.2*  INR 3.93* 1.55* 1.39   PANCREAS No results found for this basename: LIPASE,  in the last 72 hours       Studies/Results: No results found.  Medications: I have reviewed the patient's current medications.  Assessment/Plan: 1. G.I. bleeding. Probably due to elevated INR. Appears to have resolved completely with correction of INR. At this point don't feel that he needs any endoscopic evaluation. He does not want EGD or colonoscopy less absolutely needed. I think he could be discharged and hold his Coumadin for several days and then have that restarted and monitored by his PCP or cardiologists. I have asked him to see [name] back in the office in about 2 weeks.   Zalayah Pizzuto JR,Cinsere Mizrahi L 05/27/2014, 8:08 AM

## 2014-05-27 NOTE — Discharge Summary (Signed)
Physician Discharge Summary  Omar Chan EAV:[id number] DOB: [birth year] DOA: 05/25/2014  PCP: [name],[name], MD  Admit date: 05/25/2014 Discharge date: 05/27/2014  Time spent: > 35 minutes  Recommendations for Outpatient Follow-up:  1. Please monitor hgb levels  Discharge Diagnoses:  Principal Problem:   GI bleed Active Problems:   Diabetes mellitus   Hypertension   Dyslipidemia   Anemia   Warfarin-induced coagulopathy   Paroxysmal a-fib   Discharge Condition: stable  Diet recommendation: heart healthy  Filed Weights   05/25/14 2313  Weight: 114.7 kg (252 lb 13.9 oz)    History of present illness:  Pt is an 80 y/o with h/o atrial fibrillation on coumadin who presented to the hospital with supratherapeutic INR and GI bleed requiring transfusion.  Hospital Course:  Principal Problem:   GI bleed  - Patient is status post transfusion of 2 units - Hgb steady at 9.0 on last check. No more bleeding after cessation of coumadin - GI specialist consulted who recommended the following: G.I. bleeding. Probably due to elevated INR. Appears to have resolved completely with correction of INR. At this point don't feel that he needs any endoscopic evaluation. He does not want EGD or colonoscopy less absolutely needed. I think he could be discharged and hold his Coumadin for several days and then have that restarted and monitored by his PCP or cardiologists. I have asked him to see [name] back in the office in about 2 weeks. - Will place order to schedule appointment with pcp in 2-3 days and discussed coumadin recommendations with patient.  Active Problems:   Diabetes mellitus  - diabetic diet - Given last creatinine of 1.1 will reastart metformin on discharge.  Dyslipidemia  - no chest pain  - continue statin   Warfarin-induced coagulopathy  - hold warfarin for the weekend and have recommended warfarin be started under supervision of primary care physician within the  next 2 or 3 days.  Paroxysmal a-fib  - Hold warfarin as patient has high risk of bleeding out on the medication rather than receiving benefit from stroke prevention discussed with family who is in agreement  -Continue amiodarone  - Please see above  Procedures:  Transfusion of 2 units of packed red blood cells  Consultations:  Gastroenterologist  Discharge Exam: Filed Vitals:   05/27/14 0452  BP: 121/71  Pulse: 65  Temp: 98.6 F (37 C)  Resp: 18    General: Patient in no acute distress, alert and awake Cardiovascular: Regular rate and rhythm, no murmurs or rubs Respiratory: Clear to auscultation bilaterally, no wheezes  Discharge Instructions You were cared for by a hospitalist during your hospital stay. If you have any questions about your discharge medications or the care you received while you were in the hospital after you are discharged, you can call the unit and asked to speak with the hospitalist on call if the hospitalist that took care of you is not available. Once you are discharged, your primary care physician will handle any further medical issues. Please note that NO REFILLS for any discharge medications will be authorized once you are discharged, as it is imperative that you return to your primary care physician (or establish a relationship with a primary care physician if you do not have one) for your aftercare needs so that they can reassess your need for medications and monitor your lab values.  Discharge Instructions   Call MD for:  difficulty breathing, headache or visual disturbances    Complete by:  As directed      Call MD for:  temperature >100.4    Complete by:  As directed      Diet - low sodium heart healthy    Complete by:  As directed      Discharge instructions    Complete by:  As directed   Patient will need follow-up within the next 2 or 3 days continue monitoring his INR and titrating his coumadin     Increase activity slowly    Complete by:   As directed           Current Discharge Medication List    CONTINUE these medications which have CHANGED   Details  warfarin (COUMADIN) 5 MG tablet Take 0.5-1 tablets (2.5-5 mg total) by mouth daily. Monday Wednesday Friday patient takes 2.5 mg all other days is 5 mg      CONTINUE these medications which have NOT CHANGED   Details  acetaminophen (TYLENOL) 500 MG tablet Take 1,000 mg by mouth every 6 (six) hours as needed. For pain    amiodarone (PACERONE) 200 MG tablet Take 1 tablet (200 mg total) by mouth daily. Qty: 90 tablet, Refills: 3   Associated Diagnoses: PAF (paroxysmal atrial fibrillation)    atorvastatin (LIPITOR) 80 MG tablet Take 0.5 tablets (40 mg total) by mouth every morning. Qty: 15 tablet, Refills: 6    furosemide (LASIX) 40 MG tablet Take 80 mg by mouth daily.     gemfibrozil (LOPID) 600 MG tablet 2 (two) times daily.    lisinopril (PRINIVIL,ZESTRIL) 40 MG tablet Take 20 mg by mouth every morning.     metFORMIN (GLUCOPHAGE) 500 MG tablet Take 500-1,000 mg by mouth daily. 1 in the am and 2 in the pm    metolazone (ZAROXOLYN) 5 MG tablet ONE TABLET ON Monday AND Thursday ONLY OR AS DIRECTED Qty: 35 tablet, Refills: 3       Allergies  Allergen Reactions  . Aspirin Hives      The results of significant diagnostics from this hospitalization (including imaging, microbiology, ancillary and laboratory) are listed below for reference.    Significant Diagnostic Studies: No results found.  Microbiology: No results found for this or any previous visit (from the past 240 hour(s)).   Labs: Basic Metabolic Panel:  Recent Labs Lab 05/25/14 1912 05/26/14 1254  NA 142 142  K 4.5 4.0  CL 103 104  CO2 25 23  GLUCOSE 226* 115*  BUN 50* 36*  CREATININE 1.83* 1.14  CALCIUM 8.7 8.7   Liver Function Tests:  Recent Labs Lab 05/25/14 1912  AST 13  ALT 10  ALKPHOS 70  BILITOT <0.2*  PROT 6.4  ALBUMIN 3.2*   No results found for this basename:  LIPASE, AMYLASE,  in the last 168 hours No results found for this basename: AMMONIA,  in the last 168 hours CBC:  Recent Labs Lab 05/25/14 1912 05/26/14 1254 05/26/14 1520 05/26/14 2300 05/27/14 0650  WBC 5.8 5.5  --   --   --   HGB 7.5* 9.3* 8.5* 8.8* 9.0*  HCT 24.1* 28.5* 26.3* 27.2* 28.1*  MCV 89.9 87.7  --   --   --   PLT 311 247  --   --   --    Cardiac Enzymes: No results found for this basename: CKTOTAL, CKMB, CKMBINDEX, TROPONINI,  in the last 168 hours BNP: BNP (last 3 results) No results found for this basename: PROBNP,  in the last 8760 hours CBG:  Recent Labs  Lab 05/26/14 2328 05/27/14 0411 05/27/14 0438 05/27/14 0746 05/27/14 1133  GLUCAP 95 112* 118* 112* 171*       Signed:  Penny PiaVEGA, Hailea Eaglin  [name] 05/27/2014, 12:23 PM

## 2014-05-27 NOTE — Progress Notes (Signed)
Nsg Discharge Note  Admit Date:  05/25/2014 Discharge date: 05/27/2014   Omar Chan to be D/C'd Home per MD order.  AVS completed.  Copy for chart, and copy for patient signed, and dated. Patient/caregiver able to verbalize understanding.  Discharge Medication:   Medication List         acetaminophen 500 MG tablet  Commonly known as:  TYLENOL  Take 1,000 mg by mouth every 6 (six) hours as needed. For pain     amiodarone 200 MG tablet  Commonly known as:  PACERONE  Take 1 tablet (200 mg total) by mouth daily.     atorvastatin 80 MG tablet  Commonly known as:  LIPITOR  Take 0.5 tablets (40 mg total) by mouth every morning.     furosemide 40 MG tablet  Commonly known as:  LASIX  Take 80 mg by mouth daily.     gemfibrozil 600 MG tablet  Commonly known as:  LOPID  2 (two) times daily.     lisinopril 40 MG tablet  Commonly known as:  PRINIVIL,ZESTRIL  Take 20 mg by mouth every morning.     metFORMIN 500 MG tablet  Commonly known as:  GLUCOPHAGE  Take 500-1,000 mg by mouth daily. 1 in the am and 2 in the pm     metolazone 5 MG tablet  Commonly known as:  ZAROXOLYN  ONE TABLET ON Monday AND Thursday ONLY OR AS DIRECTED     warfarin 5 MG tablet  Commonly known as:  COUMADIN  Take 0.5-1 tablets (2.5-5 mg total) by mouth daily. Monday Wednesday Friday patient takes 2.5 mg all other days is 5 mg  Start taking on:  05/30/2014        Discharge Assessment: Filed Vitals:   05/27/14 0452  BP: 121/71  Pulse: 65  Temp: 98.6 F (37 C)  Resp: 18   Skin clean, dry and intact without evidence of skin break down, no evidence of skin tears noted. IV catheter discontinued intact. Site without signs and symptoms of complications - no redness or edema noted at insertion site, patient denies c/o pain - only slight tenderness at site.  Dressing with slight pressure applied.  D/c Instructions-Education: Discharge instructions given to patient/family with verbalized  understanding. D/c education completed with patient/family including follow up instructions, medication list, d/c activities limitations if indicated, with other d/c instructions as indicated by MD - patient able to verbalize understanding, all questions fully answered. Patient instructed to return to ED, call 911, or call MD for any changes in condition.  Patient escorted via WC, and D/C home via private auto.  Kern ReapBrumagin, Rashada Klontz L, RN 05/27/2014 1:53 PM

## 2014-05-27 NOTE — Clinical Documentation Improvement (Signed)
  80 year old white male admitted for anemia and GI bleed.  Admission BUN/Cr 50/1.83.  Repeat BUN/Cr 36/1.14  Possible clinical conditions:   - Acute Kidney Injury, improved/resolved   - Other condition   - Unable to clinically determine  Thank You, Jerral Ralphathy R Patra Gherardi ,RN Clinical Documentation Specialist:  [phone] [facility]- Health Information Management

## 2014-05-30 ENCOUNTER — Inpatient Hospital Stay (HOSPITAL_COMMUNITY)
Admission: EM | Admit: 2014-05-30 | Discharge: 2014-06-03 | DRG: 812 | Disposition: A | Discharge status: Home / Self Care | Payer: Medicare Other | Attending: Internal Medicine | Admitting: Internal Medicine

## 2014-05-30 ENCOUNTER — Encounter (HOSPITAL_COMMUNITY): Payer: Self-pay | Admitting: Emergency Medicine

## 2014-05-30 DIAGNOSIS — E119 Type 2 diabetes mellitus without complications: Secondary | ICD-10-CM | POA: Diagnosis present

## 2014-05-30 DIAGNOSIS — I480 Paroxysmal atrial fibrillation: Secondary | ICD-10-CM | POA: Diagnosis present

## 2014-05-30 DIAGNOSIS — K449 Diaphragmatic hernia without obstruction or gangrene: Secondary | ICD-10-CM | POA: Diagnosis present

## 2014-05-30 DIAGNOSIS — Z66 Do not resuscitate: Secondary | ICD-10-CM | POA: Diagnosis present

## 2014-05-30 DIAGNOSIS — Z886 Allergy status to analgesic agent status: Secondary | ICD-10-CM

## 2014-05-30 DIAGNOSIS — K219 Gastro-esophageal reflux disease without esophagitis: Secondary | ICD-10-CM | POA: Diagnosis present

## 2014-05-30 DIAGNOSIS — D649 Anemia, unspecified: Principal | ICD-10-CM | POA: Diagnosis present

## 2014-05-30 DIAGNOSIS — I440 Atrioventricular block, first degree: Secondary | ICD-10-CM | POA: Diagnosis present

## 2014-05-30 DIAGNOSIS — I482 Chronic atrial fibrillation: Secondary | ICD-10-CM | POA: Diagnosis present

## 2014-05-30 DIAGNOSIS — Z87891 Personal history of nicotine dependence: Secondary | ICD-10-CM

## 2014-05-30 DIAGNOSIS — I10 Essential (primary) hypertension: Secondary | ICD-10-CM | POA: Diagnosis present

## 2014-05-30 DIAGNOSIS — Z7901 Long term (current) use of anticoagulants: Secondary | ICD-10-CM

## 2014-05-30 DIAGNOSIS — Z96653 Presence of artificial knee joint, bilateral: Secondary | ICD-10-CM | POA: Diagnosis present

## 2014-05-30 DIAGNOSIS — R791 Abnormal coagulation profile: Secondary | ICD-10-CM | POA: Diagnosis present

## 2014-05-30 DIAGNOSIS — E785 Hyperlipidemia, unspecified: Secondary | ICD-10-CM | POA: Diagnosis present

## 2014-05-30 DIAGNOSIS — E669 Obesity, unspecified: Secondary | ICD-10-CM | POA: Diagnosis present

## 2014-05-30 DIAGNOSIS — Z809 Family history of malignant neoplasm, unspecified: Secondary | ICD-10-CM

## 2014-05-30 DIAGNOSIS — I4891 Unspecified atrial fibrillation: Secondary | ICD-10-CM

## 2014-05-30 DIAGNOSIS — E118 Type 2 diabetes mellitus with unspecified complications: Secondary | ICD-10-CM

## 2014-05-30 DIAGNOSIS — R195 Other fecal abnormalities: Secondary | ICD-10-CM | POA: Diagnosis present

## 2014-05-30 DIAGNOSIS — I5032 Chronic diastolic (congestive) heart failure: Secondary | ICD-10-CM | POA: Diagnosis present

## 2014-05-30 LAB — COMPREHENSIVE METABOLIC PANEL
ALT: 14 U/L (ref 0–53)
AST: 16 U/L (ref 0–37)
Albumin: 3.1 g/dL — ABNORMAL LOW (ref 3.5–5.2)
Alkaline Phosphatase: 76 U/L (ref 39–117)
Anion gap: 14 (ref 5–15)
BUN: 22 mg/dL (ref 6–23)
CO2: 24 mEq/L (ref 19–32)
Calcium: 8.6 mg/dL (ref 8.4–10.5)
Chloride: 107 mEq/L (ref 96–112)
Creatinine, Ser: 1.18 mg/dL (ref 0.50–1.35)
GFR calc Af Amer: 63 mL/min — ABNORMAL LOW (ref >90)
GFR calc non Af Amer: 54 mL/min — ABNORMAL LOW (ref >90)
Glucose, Bld: 160 mg/dL — ABNORMAL HIGH (ref 70–99)
Potassium: 4.9 mEq/L (ref 3.7–5.3)
SODIUM: 145 meq/L (ref 137–147)
TOTAL PROTEIN: 6.2 g/dL (ref 6.0–8.3)
Total Bilirubin: <0.2 mg/dL — ABNORMAL LOW (ref 0.3–1.2)

## 2014-05-30 LAB — CBC WITH DIFFERENTIAL/PLATELET
BASOS ABS: 0 10*3/uL (ref 0.0–0.1)
BASOS PCT: 0 % (ref 0–1)
EOS ABS: 0.1 10*3/uL (ref 0.0–0.7)
EOS PCT: 2 % (ref 0–5)
HCT: 29.8 % — ABNORMAL LOW (ref 39.0–52.0)
Hemoglobin: 9 g/dL — ABNORMAL LOW (ref 13.0–17.0)
Lymphocytes Relative: 19 % (ref 12–46)
Lymphs Abs: 1 10*3/uL (ref 0.7–4.0)
MCH: 27.4 pg (ref 26.0–34.0)
MCHC: 30.2 g/dL (ref 30.0–36.0)
MCV: 90.6 fL (ref 78.0–100.0)
Monocytes Absolute: 0.5 10*3/uL (ref 0.1–1.0)
Monocytes Relative: 11 % (ref 3–12)
Neutro Abs: 3.4 10*3/uL (ref 1.7–7.7)
Neutrophils Relative %: 68 % (ref 43–77)
PLATELETS: 290 10*3/uL (ref 150–400)
RBC: 3.29 MIL/uL — ABNORMAL LOW (ref 4.22–5.81)
RDW: 15.1 % (ref 11.5–15.5)
WBC: 5 10*3/uL (ref 4.0–10.5)

## 2014-05-30 LAB — TYPE AND SCREEN
ABO/RH(D): O NEG
ABO/RH(D): O NEG
Antibody Screen: NEGATIVE
Antibody Screen: NEGATIVE
UNIT DIVISION: 0
UNIT DIVISION: 0
Unit division: 0

## 2014-05-30 LAB — PROTIME-INR
INR: 1.11 (ref 0.00–1.49)
Prothrombin Time: 14.5 seconds (ref 11.6–15.2)

## 2014-05-30 LAB — TROPONIN I: Troponin I: <0.3 ng/mL (ref <0.30)

## 2014-05-30 LAB — APTT: APTT: 31 s (ref 24–37)

## 2014-05-30 LAB — POC OCCULT BLOOD, ED: Fecal Occult Bld: POSITIVE — AB

## 2014-05-30 NOTE — ED Notes (Signed)
Pt c/o low hemoglobin at 7.5 today; pt seen here last week for same; pt sts gen weakness

## 2014-05-30 NOTE — ED Provider Notes (Signed)
CSN: [id number]     Arrival date & time 05/30/14  1623 History   First MD Initiated Contact with Patient 05/30/14 2025     Chief Complaint  Patient presents with  . Abnormal Lab     (Consider location/radiation/quality/duration/timing/severity/associated sxs/prior Treatment) Patient is a 80 y.o. male presenting with weakness. The history is provided by the patient. No language interpreter was used.  Weakness This is a recurrent problem. The current episode started in the past 7 days. The problem occurs constantly. The problem has been unchanged. Associated symptoms include fatigue and weakness. Pertinent negatives include no abdominal pain, chest pain, congestion, coughing, fever, headaches, myalgias, nausea, rash, sore throat, vertigo or vomiting. Nothing aggravates the symptoms. Treatments tried: coumadin stopped, recent transfusion for anemia. The treatment provided mild relief.    Past Medical History  Diagnosis Date  . Paroxysmal atrial fibrillation     cardioversion 01/25/10  . Diabetes mellitus   . Exogenous obesity   . Hypertension   . Microcytic anemia   . Hyperlipidemia   . GERD (gastroesophageal reflux disease)   . Hiatal hernia   . Osteoarthritis    Past Surgical History  Procedure Laterality Date  . Total knee arthroplasty      B  . Cardioversion  01/25/10  . Cardiovascular stress test  2007    No ischemia. EF 61%  . US echocardiography  February 2011    Normal EF; LAE and RVE  . Cardioversion N/A 08/19/2013    Procedure: CARDIOVERSION;  Surgeon: Cassell Clement, MD;  Location: [facility] ENDOSCOPY;  Service: Cardiovascular;  Laterality: N/A;   Family History  Problem Relation Age of Onset  . Cancer Mother     deceased  . Pneumonia Father     deceased  . ALS Brother     deceased  . Cancer Brother     deceased/bone ca  . Cancer Sister     deceased   History  Substance Use Topics  . Smoking status: Former Smoker    Quit date: 07/29/1981  . Smokeless tobacco:  Not on file  . Alcohol Use: No    Review of Systems  Constitutional: Positive for fatigue. Negative for fever.  HENT: Negative for congestion, rhinorrhea and sore throat.   Respiratory: Negative for cough and shortness of breath.   Cardiovascular: Negative for chest pain.  Gastrointestinal: Negative for nausea, vomiting, abdominal pain and diarrhea.  Genitourinary: Negative for dysuria and hematuria.  Musculoskeletal: Negative for myalgias.  Skin: Negative for rash.  Neurological: Positive for weakness and light-headedness. Negative for vertigo, syncope and headaches.  All other systems reviewed and are negative.     Allergies  Aspirin  Home Medications   Prior to Admission medications   Medication Sig Start Date End Date Taking? Authorizing Provider  acetaminophen (TYLENOL) 500 MG tablet Take 1,000 mg by mouth every 6 (six) hours as needed. For pain    Historical Provider, MD  amiodarone (PACERONE) 200 MG tablet Take 1 tablet (200 mg total) by mouth daily. 12/01/13   Cassell Clement, MD  atorvastatin (LIPITOR) 80 MG tablet Take 0.5 tablets (40 mg total) by mouth every morning. 10/08/13   Marinus Maw, MD  furosemide (LASIX) 40 MG tablet Take 80 mg by mouth daily.  02/11/14   Historical Provider, MD  gemfibrozil (LOPID) 600 MG tablet 2 (two) times daily. 07/20/13   Historical Provider, MD  lisinopril (PRINIVIL,ZESTRIL) 40 MG tablet Take 20 mg by mouth every morning.     Historical Provider, MD  metFORMIN (GLUCOPHAGE) 500 MG tablet Take 500-1,000 mg by mouth daily. 1 in the am and 2 in the pm    Historical Provider, MD  metolazone (ZAROXOLYN) 5 MG tablet ONE TABLET ON Monday AND Thursday ONLY OR AS DIRECTED 04/20/14   Cassell Clementhomas Brackbill, MD  warfarin (COUMADIN) 5 MG tablet Take 0.5-1 tablets (2.5-5 mg total) by mouth daily. Monday Wednesday Friday patient takes 2.5 mg all other days is 5 mg 05/30/14   Penny Piarlando Vega, MD   BP 109/82 mmHg  Pulse 52  Temp(Src) 97.7 F (36.5 C) (Oral)   Resp 21  Ht 6' (1.829 m)  Wt 260 lb 4 oz (118.049 kg)  BMI 35.29 kg/m2  SpO2 96% Physical Exam  Constitutional: He is oriented to person, place, and time. He appears well-developed and well-nourished.  HENT:  Head: Normocephalic and atraumatic.  Right Ear: External ear normal.  Left Ear: External ear normal.  Eyes: EOM are normal.  Neck: Normal range of motion. Neck supple.  Cardiovascular: Normal rate, regular rhythm and intact distal pulses.  Exam reveals no gallop and no friction rub.   Murmur (sysolic) heard. Pulmonary/Chest: Effort normal and breath sounds normal. No respiratory distress. He has no wheezes. He has no rales. He exhibits no tenderness.  Abdominal: Soft. Bowel sounds are normal. He exhibits no distension. There is no tenderness. There is no rebound.  Genitourinary: Guaiac positive stool.  No gross blood on exam  Musculoskeletal: Normal range of motion. He exhibits edema (1+ bilateral lower extremity). He exhibits no tenderness.  Lymphadenopathy:    He has no cervical adenopathy.  Neurological: He is alert and oriented to person, place, and time.  Skin: Skin is warm. No rash noted.  Psychiatric: He has a normal mood and affect. His behavior is normal.  Nursing note and vitals reviewed.   ED Course  Procedures (including critical care time) Labs Review Labs Reviewed  CBC WITH DIFFERENTIAL - Abnormal; Notable for the following:    RBC 3.29 (*)    Hemoglobin 9.0 (*)    HCT 29.8 (*)    All other components within normal limits  COMPREHENSIVE METABOLIC PANEL - Abnormal; Notable for the following:    Glucose, Bld 160 (*)    Albumin 3.1 (*)    Total Bilirubin <0.2 (*)    GFR calc non Af Amer 54 (*)    GFR calc Af Amer 63 (*)    All other components within normal limits  POC OCCULT BLOOD, ED - Abnormal; Notable for the following:    Fecal Occult Bld POSITIVE (*)    All other components within normal limits  PROTIME-INR  APTT  TROPONIN I  TYPE AND SCREEN     Imaging Review No results found.   EKG Interpretation   Date/Time:  Monday May 30 2014 21:43:07 EST Ventricular Rate:  63 PR Interval:  245 QRS Duration: 91 QT Interval:  438 QTC Calculation: 448 R Axis:   1 Text Interpretation:  Sinus rhythm Prolonged PR interval Low voltage,  precordial leads No significant change was found Confirmed by Manus GunningANCOUR  MD,  STEPHEN [id number]) on 05/30/2014 9:55:19 PM      MDM   Final diagnoses:  Symptomatic anemia    8:42 PM Pt is a 80 y.o. male with pertinent PMHX of pafib on coumadin, DM, HTN, GERD, HLD who presents to the ED with concern for symptomatic anemia. Seen at PCP with recheck hemoglobin of 7.5. Admitted last week for GI bleed, coumadin coagulopathy. Endorses generalized fatigue  and weakness with lightheadedness. No syncope. No chest pain. Baseline shortness of breath on exertion. No fevers or recent illness. On coumadin for afib. Previous transfusions. Denies nausea, vomiting or diarrhea. No fevers or recent illness.   On exam: otherwise well appearing, lungs CTA. Equal peripheral pulses. Concern for symptomatic anemia. Rectal exam: no gross blood. Plan for CBC, CMP,l INR, EKG, Troponin, stool hemo-occult  EKG personally reviewed by myself showed 1st degree AV block Rate of 63, PR 245ms, QRS 91ms QT/QTC 438/46049ms, normal axis, without evidence of new ischemia. Comparison showed similar, indication: generalized weakness  Review of labs: Stool hemo-occult: positive Troponin: <0.30 CBC: no leukocytosis, stable H&H 9.0/29.8 CMP: no electrolyte abnormalities, no elevated LFTs INR: 1.11   11:10 PM BP of 98 systolic, repeat 161/09109/82. Concern for symptomatic anemia given history of recent admission for possible GI bleed and coumadin induced coagulopathy. Patient has endorsed some lightheadedness and generalized fatigue. Plan to consult [facility]  Plan for admission  Labs, EKG reviewed by myself and considered in medical  decision making if ordered.   Pt was discussed with my attending, Dr. Terance Hartancour     Moraima Burd Peter Sharlee Rufino, MD 05/31/14 0111

## 2014-05-31 ENCOUNTER — Encounter (HOSPITAL_COMMUNITY): Payer: Self-pay | Admitting: *Deleted

## 2014-05-31 DIAGNOSIS — K449 Diaphragmatic hernia without obstruction or gangrene: Secondary | ICD-10-CM | POA: Diagnosis present

## 2014-05-31 DIAGNOSIS — I440 Atrioventricular block, first degree: Secondary | ICD-10-CM | POA: Diagnosis present

## 2014-05-31 DIAGNOSIS — E119 Type 2 diabetes mellitus without complications: Secondary | ICD-10-CM | POA: Diagnosis present

## 2014-05-31 DIAGNOSIS — I480 Paroxysmal atrial fibrillation: Secondary | ICD-10-CM | POA: Diagnosis present

## 2014-05-31 DIAGNOSIS — Z87891 Personal history of nicotine dependence: Secondary | ICD-10-CM | POA: Diagnosis not present

## 2014-05-31 DIAGNOSIS — Z7901 Long term (current) use of anticoagulants: Secondary | ICD-10-CM | POA: Diagnosis not present

## 2014-05-31 DIAGNOSIS — Z66 Do not resuscitate: Secondary | ICD-10-CM | POA: Diagnosis present

## 2014-05-31 DIAGNOSIS — Z886 Allergy status to analgesic agent status: Secondary | ICD-10-CM | POA: Diagnosis not present

## 2014-05-31 DIAGNOSIS — R195 Other fecal abnormalities: Secondary | ICD-10-CM | POA: Diagnosis present

## 2014-05-31 DIAGNOSIS — I5032 Chronic diastolic (congestive) heart failure: Secondary | ICD-10-CM | POA: Diagnosis present

## 2014-05-31 DIAGNOSIS — Z809 Family history of malignant neoplasm, unspecified: Secondary | ICD-10-CM | POA: Diagnosis not present

## 2014-05-31 DIAGNOSIS — E669 Obesity, unspecified: Secondary | ICD-10-CM | POA: Diagnosis present

## 2014-05-31 DIAGNOSIS — E1169 Type 2 diabetes mellitus with other specified complication: Secondary | ICD-10-CM

## 2014-05-31 DIAGNOSIS — I10 Essential (primary) hypertension: Secondary | ICD-10-CM | POA: Diagnosis present

## 2014-05-31 DIAGNOSIS — I482 Chronic atrial fibrillation: Secondary | ICD-10-CM | POA: Diagnosis present

## 2014-05-31 DIAGNOSIS — Z96653 Presence of artificial knee joint, bilateral: Secondary | ICD-10-CM | POA: Diagnosis present

## 2014-05-31 DIAGNOSIS — E785 Hyperlipidemia, unspecified: Secondary | ICD-10-CM | POA: Diagnosis present

## 2014-05-31 DIAGNOSIS — K219 Gastro-esophageal reflux disease without esophagitis: Secondary | ICD-10-CM | POA: Diagnosis present

## 2014-05-31 DIAGNOSIS — D649 Anemia, unspecified: Principal | ICD-10-CM

## 2014-05-31 DIAGNOSIS — R791 Abnormal coagulation profile: Secondary | ICD-10-CM | POA: Diagnosis present

## 2014-05-31 LAB — PROTIME-INR
INR: 1.16 (ref 0.00–1.49)
Prothrombin Time: 14.9 seconds (ref 11.6–15.2)

## 2014-05-31 LAB — GLUCOSE, CAPILLARY
GLUCOSE-CAPILLARY: 151 mg/dL — AB (ref 70–99)
GLUCOSE-CAPILLARY: 172 mg/dL — AB (ref 70–99)
Glucose-Capillary: 143 mg/dL — ABNORMAL HIGH (ref 70–99)
Glucose-Capillary: 140 mg/dL — ABNORMAL HIGH (ref 70–99)
Glucose-Capillary: 190 mg/dL — ABNORMAL HIGH (ref 70–99)

## 2014-05-31 LAB — CBC
HEMATOCRIT: 28.4 % — AB (ref 39.0–52.0)
HEMOGLOBIN: 9.1 g/dL — AB (ref 13.0–17.0)
MCH: 28.4 pg (ref 26.0–34.0)
MCHC: 32 g/dL (ref 30.0–36.0)
MCV: 88.8 fL (ref 78.0–100.0)
Platelets: 256 10*3/uL (ref 150–400)
RBC: 3.2 MIL/uL — AB (ref 4.22–5.81)
RDW: 14.9 % (ref 11.5–15.5)
WBC: 5.1 10*3/uL (ref 4.0–10.5)

## 2014-05-31 LAB — COMPREHENSIVE METABOLIC PANEL
ALT: 13 U/L (ref 0–53)
ANION GAP: 12 (ref 5–15)
AST: 15 U/L (ref 0–37)
Albumin: 3 g/dL — ABNORMAL LOW (ref 3.5–5.2)
Alkaline Phosphatase: 67 U/L (ref 39–117)
BUN: 19 mg/dL (ref 6–23)
CO2: 26 mEq/L (ref 19–32)
CREATININE: 1.1 mg/dL (ref 0.50–1.35)
Calcium: 8.7 mg/dL (ref 8.4–10.5)
Chloride: 105 mEq/L (ref 96–112)
GFR calc Af Amer: 68 mL/min — ABNORMAL LOW (ref >90)
GFR calc non Af Amer: 59 mL/min — ABNORMAL LOW (ref >90)
Glucose, Bld: 133 mg/dL — ABNORMAL HIGH (ref 70–99)
Potassium: 3.9 mEq/L (ref 3.7–5.3)
Sodium: 143 mEq/L (ref 137–147)
TOTAL PROTEIN: 5.8 g/dL — AB (ref 6.0–8.3)
Total Bilirubin: 0.2 mg/dL — ABNORMAL LOW (ref 0.3–1.2)

## 2014-05-31 LAB — PRO B NATRIURETIC PEPTIDE: PRO B NATRI PEPTIDE: 597.3 pg/mL — AB (ref 0–450)

## 2014-05-31 MED ORDER — ACETAMINOPHEN 650 MG RE SUPP: 650.0000 mg | Freq: Four times a day (QID) | RECTAL | Status: DC | PRN

## 2014-05-31 MED ORDER — ONDANSETRON HCL 4 MG/2ML IJ SOLN
4.0000 mg | Freq: Four times a day (QID) | INTRAMUSCULAR | Status: DC | PRN
Start: 2014-05-31 — End: 2014-06-03

## 2014-05-31 MED ORDER — ATORVASTATIN CALCIUM 40 MG PO TABS
40.0000 mg | ORAL_TABLET | Freq: Every morning | ORAL | Status: DC
  Administered 2014-05-31 – 2014-06-01 (×2): 40 mg via ORAL
  Filled 2014-05-31 (×4): qty 1

## 2014-05-31 MED ORDER — FUROSEMIDE 10 MG/ML IJ SOLN
40.0000 mg | Freq: Every day | INTRAMUSCULAR | Status: DC
  Administered 2014-05-31 – 2014-06-01 (×2): 40 mg via INTRAVENOUS
  Filled 2014-05-31 (×5): qty 4

## 2014-05-31 MED ORDER — LISINOPRIL 20 MG PO TABS
20.0000 mg | ORAL_TABLET | Freq: Every morning | ORAL | Status: DC
  Administered 2014-05-31: 20 mg via ORAL
  Filled 2014-05-31 (×4): qty 1

## 2014-05-31 MED ORDER — AMIODARONE HCL 200 MG PO TABS
200.0000 mg | ORAL_TABLET | Freq: Every day | ORAL | Status: DC
  Administered 2014-05-31 – 2014-06-01 (×2): 200 mg via ORAL
  Filled 2014-05-31 (×4): qty 1

## 2014-05-31 MED ORDER — INSULIN ASPART 100 UNIT/ML ~~LOC~~ SOLN: 0.0000 [IU] | Freq: Every day | SUBCUTANEOUS | Status: DC

## 2014-05-31 MED ORDER — INSULIN ASPART 100 UNIT/ML ~~LOC~~ SOLN
0.0000 [IU] | Freq: Three times a day (TID) | SUBCUTANEOUS | Status: DC
  Administered 2014-05-31 (×2): 2 [IU] via SUBCUTANEOUS
  Administered 2014-05-31: 3 [IU] via SUBCUTANEOUS
  Administered 2014-06-01: 2 [IU] via SUBCUTANEOUS
  Administered 2014-06-01: 3 [IU] via SUBCUTANEOUS
  Administered 2014-06-01 – 2014-06-03 (×3): 2 [IU] via SUBCUTANEOUS

## 2014-05-31 MED ORDER — TIOTROPIUM BROMIDE MONOHYDRATE 18 MCG IN CAPS
18.0000 ug | ORAL_CAPSULE | Freq: Every day | RESPIRATORY_TRACT | Status: DC
  Administered 2014-05-31 – 2014-06-03 (×3): 18 ug via RESPIRATORY_TRACT
  Filled 2014-05-31: qty 5

## 2014-05-31 MED ORDER — ONDANSETRON HCL 4 MG PO TABS: 4.0000 mg | ORAL_TABLET | Freq: Four times a day (QID) | ORAL | Status: DC | PRN

## 2014-05-31 MED ORDER — GEMFIBROZIL 600 MG PO TABS
600.0000 mg | ORAL_TABLET | Freq: Two times a day (BID) | ORAL | Status: DC
  Administered 2014-05-31: 600 mg via ORAL
  Filled 2014-05-31 (×2): qty 1

## 2014-05-31 MED ORDER — ZOLPIDEM TARTRATE 5 MG PO TABS
5.0000 mg | ORAL_TABLET | Freq: Once | ORAL | Status: AC
  Administered 2014-05-31: 5 mg via ORAL
  Filled 2014-05-31: qty 1

## 2014-05-31 MED ORDER — FENOFIBRATE 160 MG PO TABS
160.0000 mg | ORAL_TABLET | Freq: Every day | ORAL | Status: DC
  Administered 2014-06-01: 160 mg via ORAL
  Filled 2014-05-31 (×3): qty 1

## 2014-05-31 MED ORDER — PANTOPRAZOLE SODIUM 40 MG PO TBEC
40.0000 mg | DELAYED_RELEASE_TABLET | Freq: Every day | ORAL | Status: DC
  Administered 2014-05-31 – 2014-06-01 (×2): 40 mg via ORAL
  Filled 2014-05-31 (×2): qty 1

## 2014-05-31 MED ORDER — SODIUM CHLORIDE 0.9 % IJ SOLN
3.0000 mL | Freq: Two times a day (BID) | INTRAMUSCULAR | Status: DC
  Administered 2014-05-31 – 2014-06-02 (×5): 3 mL via INTRAVENOUS

## 2014-05-31 MED ORDER — ACETAMINOPHEN 325 MG PO TABS: 650.0000 mg | ORAL_TABLET | Freq: Four times a day (QID) | ORAL | Status: DC | PRN

## 2014-05-31 NOTE — Progress Notes (Signed)
PT Cancellation Note  Patient Details Name: Omar Chan MRN: [id number] DOB: March 11, 1926   Cancelled Treatment:    Reason Eval/Treat Not Completed: PT screened, no needs identified, will sign off.   At the time of PT eval pt and daughter present in room. Daughter appeared very defensive and somewhat confrontational regarding PT working with the patient. Offered position change to pt out of the bed, and daughter states that he just laid back down. Pt states he has no need for PT and he is getting along fine at home with wife and daughter to assist him. Denies any equipment need at d/c. Pt/daughter ask that PT does not return, they have no needs.   Conni SlipperKirkman, Milam Allbaugh 05/31/2014, 10:20 AM   Conni SlipperLaura Brinden Kincheloe, PT, DPT Acute Rehabilitation Services Pager: [phone]

## 2014-05-31 NOTE — Progress Notes (Signed)
UR Completed Nevia Henkin Graves-Bigelow, RN,BSN [phone]  

## 2014-05-31 NOTE — Care Management Note (Unsigned)
    Page 1 of 1   05/31/2014     4:00:59 PM CARE MANAGEMENT NOTE 05/31/2014  Patient:  Omar Chan,Omar Chan   Account Number:  [id number]  Date Initiated:  05/31/2014  Documentation initiated by:  GRAVES-BIGELOW,Annikah Lovins  Subjective/Objective Assessment:   Pt admitted for fatigue and weakness with HGb of 7.5. From home with family support.     Action/Plan:   CM will continue to monitor for disposition needs.   Anticipated DC Date:  06/01/2014   Anticipated DC Plan:  HOME W HOME HEALTH SERVICES      DC Planning Services  CM consult      Choice offered to / List presented to:             Status of service:  In process, will continue to follow Medicare Important Message given?   (If response is "NO", the following Medicare IM given date fields will be blank) Date Medicare IM given:   Medicare IM given by:   Date Additional Medicare IM given:   Additional Medicare IM given by:    Discharge Disposition:    Per UR Regulation:  Reviewed for med. necessity/level of care/duration of stay  If discussed at Long Length of Stay Meetings, dates discussed:    Comments:

## 2014-05-31 NOTE — ED Notes (Signed)
Admitting MD at bedside.

## 2014-05-31 NOTE — Plan of Care (Signed)
Problem: Phase I Progression Outcomes Goal: Pain controlled with appropriate interventions Outcome: Completed/Met Date Met:  05/31/14 Goal: OOB as tolerated unless otherwise ordered Outcome: Completed/Met Date Met:  05/31/14 Goal: Initial discharge plan identified Outcome: Completed/Met Date Met:  05/31/14 Goal: Voiding-avoid urinary catheter unless indicated Outcome: Completed/Met Date Met:  05/31/14 Goal: Hemodynamically stable Outcome: Completed/Met Date Met:  05/31/14     

## 2014-05-31 NOTE — H&P (Signed)
[name] History and Physical  Patient: Omar Chan  WUJ:[id number]  DOB: [birth year]  DOS: the patient was seen and examined on 05/31/2014 PCP: Mickie Hillier, MD  Chief Complaint: tiredness and abnormal lab  HPI: CHICK COUSINS is a 80 y.o. male with Past medical history of A. Fib, cardioversion, diabetes mellitus, hypertension, GERD. The patient presents with complaints of fatigue and tiredness. He mentions that since his recent hospitalization he has been continues to have generalized fatigue and tiredness which has not improved. He was scheduled to follow-up with his PCP next week but his PCP wanted him to be scheduled today and wanted his blood work drawn. His hemoglobin was found to be 7.5 and therefore he was referred here for further workup. He should denies any complaints of nausea or vomiting abdominal pain or back pain, diarrhea or constipation, burning urination, active bleeding. Patient also denies any recent changes in his medication other than stopping of the warfarin. He is not taking any over-the-counter pain medication.  The patient is coming from home. And at his baseline independent for most of his ADL.  Review of Systems: as mentioned in the history of present illness.  A Comprehensive review of the other systems is negative.  Past Medical History  Diagnosis Date  . Paroxysmal atrial fibrillation     cardioversion 01/25/10  . Diabetes mellitus   . Exogenous obesity   . Hypertension   . Microcytic anemia   . Hyperlipidemia   . GERD (gastroesophageal reflux disease)   . Hiatal hernia   . Osteoarthritis    Past Surgical History  Procedure Laterality Date  . Total knee arthroplasty      B  . Cardioversion  01/25/10  . Cardiovascular stress test  2007    No ischemia. EF 61%  . US echocardiography  February 2011    Normal EF; LAE and RVE  . Cardioversion N/A 08/19/2013    Procedure: CARDIOVERSION;  Surgeon: Cassell Clement, MD;  Location: [facility]  ENDOSCOPY;  Service: Cardiovascular;  Laterality: N/A;   Social History:  reports that he quit smoking about 32 years ago. He does not have any smokeless tobacco history on file. He reports that he does not drink alcohol or use illicit drugs.  Allergies  Allergen Reactions  . Aspirin Hives    Family History  Problem Relation Age of Onset  . Cancer Mother     deceased  . Pneumonia Father     deceased  . ALS Brother     deceased  . Cancer Brother     deceased/bone ca  . Cancer Sister     deceased    Prior to Admission medications   Medication Sig Start Date End Date Taking? Authorizing Provider  acetaminophen (TYLENOL) 500 MG tablet Take 1,000 mg by mouth every 6 (six) hours as needed. For pain   Yes Historical Provider, MD  amiodarone (PACERONE) 200 MG tablet Take 1 tablet (200 mg total) by mouth daily. 12/01/13  Yes Cassell Clement, MD  atorvastatin (LIPITOR) 80 MG tablet Take 0.5 tablets (40 mg total) by mouth every morning. 10/08/13  Yes Marinus Maw, MD  furosemide (LASIX) 40 MG tablet Take 80 mg by mouth daily.  02/11/14  Yes Historical Provider, MD  gemfibrozil (LOPID) 600 MG tablet Take 600 mg by mouth 2 (two) times daily.  07/20/13  Yes Historical Provider, MD  lisinopril (PRINIVIL,ZESTRIL) 40 MG tablet Take 20 mg by mouth every morning.    Yes Historical Provider, MD  metFORMIN (GLUCOPHAGE) 500 MG tablet Take 500-1,000 mg by mouth 2 (two) times daily with a meal. 1 tab in the am and 2 tabs in the pm   Yes Historical Provider, MD  metolazone (ZAROXOLYN) 5 MG tablet Take 5 mg by mouth 2 (two) times a week. Takes 5mg  on Mon and Thurs only   Yes Historical Provider, MD  Probiotic Product (ALIGN) 4 MG CAPS Take 4 mg by mouth daily.   Yes Historical Provider, MD  tiotropium (SPIRIVA) 18 MCG inhalation capsule Place 18 mcg into inhaler and inhale daily.   Yes Historical Provider, MD  warfarin (COUMADIN) 5 MG tablet Take 0.5-1 tablets (2.5-5 mg total) by mouth daily. Monday  Wednesday Friday patient takes 2.5 mg all other days is 5 mg 05/30/14   Penny Piarlando Vega, MD    Physical Exam: Filed Vitals:   05/31/14 0000 05/31/14 0015 05/31/14 0030 05/31/14 0123  BP: 166/67 176/71 138/80 178/68  Pulse: 59 57 59 67  Temp:    97.8 F (36.6 C)  TempSrc:      Resp: 20 18 25 24   Height:    6' (1.829 m)  Weight:    115.8 kg (255 lb 4.7 oz)  SpO2: 97% 97% 97%     General: Alert, Awake and Oriented to Time, Place and Person. Appear in mild distress Eyes: PERRL ENT: Oral Mucosa clear moist. Neck: no JVD Cardiovascular: S1 and S2 Present, no Murmur, Peripheral Pulses Present Respiratory: Bilateral Air entry equal and Decreased, Clear to Auscultation, noCrackles, no wheezes Abdomen: Bowel Sound present, Soft and nono tender Skin: no Rash Extremities: no Pedal edema, no calf tenderness Neurologic: Grossly no focal neuro deficit.  Labs on Admission:  CBC:  Recent Labs Lab 05/25/14 1912 05/26/14 1254 05/26/14 1520 05/26/14 2300 05/27/14 0650 05/30/14 1659  WBC 5.8 5.5  --   --   --  5.0  NEUTROABS  --   --   --   --   --  3.4  HGB 7.5* 9.3* 8.5* 8.8* 9.0* 9.0*  HCT 24.1* 28.5* 26.3* 27.2* 28.1* 29.8*  MCV 89.9 87.7  --   --   --  90.6  PLT 311 247  --   --   --  290    CMP     Component Value Date/Time   NA 145 05/30/2014 1659   K 4.9 05/30/2014 1659   CL 107 05/30/2014 1659   CO2 24 05/30/2014 1659   GLUCOSE 160* 05/30/2014 1659   BUN 22 05/30/2014 1659   CREATININE 1.18 05/30/2014 1659   CALCIUM 8.6 05/30/2014 1659   PROT 6.2 05/30/2014 1659   ALBUMIN 3.1* 05/30/2014 1659   AST 16 05/30/2014 1659   ALT 14 05/30/2014 1659   ALKPHOS 76 05/30/2014 1659   BILITOT <0.2* 05/30/2014 1659   GFRNONAA 54* 05/30/2014 1659   GFRAA 63* 05/30/2014 1659    No results for input(s): LIPASE, AMYLASE in the last 168 hours. No results for input(s): AMMONIA in the last 168 hours.   Recent Labs Lab 05/30/14 2059  TROPONINI <0.30   BNP (last 3 results) No  results for input(s): PROBNP in the last 8760 hours.  Radiological Exams on Admission: No results found.  Assessment/Plan Principal Problem:   Symptomatic anemia Active Problems:   Diabetes mellitus   Hypertension   1. Symptomatic anemia The patient is presenting with complaints of generalized fatigue and weakness. On further workup he is found to have recent history of GI bleeding for which she  has been admitted and has not undergone any endoscopic procedure, due to associated risk. At present the patient will be admitted in the hospital. We will continue to monitor his H&H. I would continue him on PPI as well. Patient will be kept on clear liquid diet. Gastroenterology will be consulted if there is any further drop in patient's hemoglobin.  2.cchronic diastolic heart failure. Patient was recently hospitalized and has gained 8 pounds over last one week. Patient appears short of breath but does not have any significant shock volume overload on clinical examination. Check proBNP and continue patient on 40 of IV Lasix.  3.hypertension. Continuing home medications.  4. Diabetes mellitus holding metformin and placing the patient on sliding scale.  Advance goals of care discussion: DNR/DNI   DVT Prophylaxis: mechanical compression device Nutrition: liquid diet  Family Communication: family was present at bedside, opportunity was given to ask question and all questions were answered satisfactorily at the time of interview. Disposition: Admitted to inpatient in telemetry unit.  Author: Lynden OxfordPranav Aphrodite Harpenau, MD [facility] Pager: [phone] 05/31/2014, 3:26 AM    If 7PM-7AM, please contact night-coverage [email or web address] Password [email or web address]

## 2014-05-31 NOTE — Progress Notes (Signed)
Patient Demographics  Omar Chan, is a 80 y.o. male, DOB - [birth year], XBJ:[id number]  Admit date - 05/30/2014   Admitting Physician Lynden OxfordPranav Patel, MD  Outpatient Primary MD for the patient is [name],Omar LORNE, MD  LOS - 1   Chief Complaint  Patient presents with  . Abnormal Lab      Brief narrative: Omar Chan is a 80 y.o. male with Past medical history of A. Fib, cardioversion, diabetes mellitus, hypertension, GERD. Since with fatigue since his recent hospitalization . He should denies any complaints of nausea or vomiting abdominal pain or back pain, diarrhea or constipation, burning urination, active bleeding. Patient was Hemoccult positive in ED, hemoglobin was stable at range of 9, she stable from discharge, and denies any hematochezia, or melena. Patient also denies any recent changes in his medication other than stopping of the warfarin.  Subjective:   Omar ReelWillard Molzahn today has, No headache, No chest pain, No abdominal pain - No Nausea, No new weakness tingling or numbness, No Cough - SOB.   Assessment & Plan    Principal Problem:   Symptomatic anemia Active Problems:   Diabetes mellitus   Hypertension  Weakness -This is most likely due to recent hospitalization, and symptomatic anemia, PT OT consulted.  Symptomatic anemia/Reason GI bleed -during recent hospitalization patient had lower GI bleed, no workup was indicated as bleeding stopped after he stopped his anticoagulation, and it was felt to be secondary to his elevated INR. -Hemoglobin remained stable at this point, we'll recheck level in a.m. -Continue with PPI. -we will consult gastroenterology if there is drop in his hemoglobin  Diabetes mellitus -continue with insulin sliding scale  Dyslipidemia  - continue statin   Hypertension -continue with home medication  Paroxysmal  a-fib  -continue Hold warfarin as patient has high risk of bleeding out on the medication rather than receiving benefit from stroke , will need to follow with his PCP/cardiologist as an outpatient regarding resumption of anticoagulation when he is stable. -Continue amiodarone   Code Status: DNR  Family Communication: daughter at bedside  Disposition Plan: home in a.m. If hemoglobin remains stable   Procedures  None   Consults   none   Medications  Scheduled Meds: . amiodarone  200 mg Oral Daily  . atorvastatin  40 mg Oral q morning - 10a  . [START ON 06/01/2014] fenofibrate  160 mg Oral Daily  . furosemide  40 mg Intravenous Daily  . insulin aspart  0-15 Units Subcutaneous TID WC  . insulin aspart  0-5 Units Subcutaneous QHS  . lisinopril  20 mg Oral q morning - 10a  . sodium chloride  3 mL Intravenous Q12H  . tiotropium  18 mcg Inhalation Daily   Continuous Infusions:  PRN Meds:.acetaminophen **OR** acetaminophen, ondansetron **OR** ondansetron (ZOFRAN) IV  DVT Prophylaxis  SCDs   Lab Results  Component Value Date   PLT 256 05/31/2014    Antibiotics    Anti-infectives    None          Objective:   Filed Vitals:   05/31/14 0437 05/31/14 0943 05/31/14 1024 05/31/14 1044  BP: 108/40 117/57  144/66  Pulse: 54 60  51  Temp: 97.5 F (36.4 C) 97.3 F (36.3 C)  TempSrc:  Oral    Resp: 20 18    Height:      Weight:      SpO2: 95% 96% 97% 97%    Wt Readings from Last 3 Encounters:  05/31/14 115.8 kg (255 lb 4.7 oz)  05/25/14 114.7 kg (252 lb 13.9 oz)  04/19/14 120.657 kg (266 lb)     Intake/Output Summary (Last 24 hours) at 05/31/14 1310 Last data filed at 05/31/14 0830  Gross per 24 hour  Intake    360 ml  Output     10 ml  Net    350 ml     Physical Exam  Awake Alert, Oriented X 3, No new F.N deficits, Normal affect [place].AT,PERRAL Supple Neck,No JVD, No cervical lymphadenopathy appriciated.  Symmetrical Chest wall movement, Good air  movement bilaterally, CTAB RRR,No Gallops,Rubs or new Murmurs, No Parasternal Heave +ve B.Sounds, Abd Soft, No tenderness, No organomegaly appriciated, No rebound - guarding or rigidity. No Cyanosis, Clubbing or edema, No new Rash or bruise     Data Review   Micro Results No results found for this or any previous visit (from the past 240 hour(s)).  Radiology Reports No results found.  CBC  Recent Labs Lab 05/25/14 1912 05/26/14 1254 05/26/14 1520 05/26/14 2300 05/27/14 0650 05/30/14 1659 05/31/14 0614  WBC 5.8 5.5  --   --   --  5.0 5.1  HGB 7.5* 9.3* 8.5* 8.8* 9.0* 9.0* 9.1*  HCT 24.1* 28.5* 26.3* 27.2* 28.1* 29.8* 28.4*  PLT 311 247  --   --   --  290 256  MCV 89.9 87.7  --   --   --  90.6 88.8  MCH 28.0 28.6  --   --   --  27.4 28.4  MCHC 31.1 32.6  --   --   --  30.2 32.0  RDW 14.9 15.0  --   --   --  15.1 14.9  LYMPHSABS  --   --   --   --   --  1.0  --   MONOABS  --   --   --   --   --  0.5  --   EOSABS  --   --   --   --   --  0.1  --   BASOSABS  --   --   --   --   --  0.0  --     Chemistries   Recent Labs Lab 05/25/14 1912 05/26/14 1254 05/30/14 1659 05/31/14 0614  NA 142 142 145 143  K 4.5 4.0 4.9 3.9  CL 103 104 107 105  CO2 25 23 24 26   GLUCOSE 226* 115* 160* 133*  BUN 50* 36* 22 19  CREATININE 1.83* 1.14 1.18 1.10  CALCIUM 8.7 8.7 8.6 8.7  AST 13  --  16 15  ALT 10  --  14 13  ALKPHOS 70  --  76 67  BILITOT <0.2*  --  <0.2* 0.2*   ------------------------------------------------------------------------------------------------------------------ estimated creatinine clearance is 63.3 mL/min (by C-G formula based on Cr of 1.1). ------------------------------------------------------------------------------------------------------------------ No results for input(s): HGBA1C in the last 72 hours. ------------------------------------------------------------------------------------------------------------------ No results for input(s): CHOL, HDL,  LDLCALC, TRIG, CHOLHDL, LDLDIRECT in the last 72 hours. ------------------------------------------------------------------------------------------------------------------ No results for input(s): TSH, T4TOTAL, T3FREE, THYROIDAB in the last 72 hours.  Invalid input(s): FREET3 ------------------------------------------------------------------------------------------------------------------ No results for input(s): VITAMINB12, FOLATE, FERRITIN, TIBC, IRON, RETICCTPCT in the last 72 hours.  Coagulation profile  Recent Labs Lab  05/25/14 1912 05/26/14 1254 05/27/14 0422 05/30/14 1659 05/31/14 0614  INR 3.93* 1.55* 1.39 1.11 1.16    No results for input(s): DDIMER in the last 72 hours.  Cardiac Enzymes  Recent Labs Lab 05/30/14 2059  TROPONINI <0.30   ------------------------------------------------------------------------------------------------------------------ Invalid input(s): POCBNP     Time Spent in minutes   25 minutes   Demarkus Remmel M.D on 05/31/2014 at 1:10 PM  Between 7am to 7pm - Pager - [phone]  After 7pm go to [email or web address] - password [email or web address]  And look for the night coverage person covering for me after hours  [name] Office  [phone]   **Disclaimer: This note may have been dictated with voice recognition software. Similar sounding words can inadvertently be transcribed and this note may contain transcription errors which may not have been corrected upon publication of note.**

## 2014-06-01 DIAGNOSIS — I482 Chronic atrial fibrillation: Secondary | ICD-10-CM

## 2014-06-01 LAB — GLUCOSE, CAPILLARY
GLUCOSE-CAPILLARY: 161 mg/dL — AB (ref 70–99)
GLUCOSE-CAPILLARY: 162 mg/dL — AB (ref 70–99)
Glucose-Capillary: 148 mg/dL — ABNORMAL HIGH (ref 70–99)
Glucose-Capillary: 132 mg/dL — ABNORMAL HIGH (ref 70–99)

## 2014-06-01 LAB — CBC
HEMATOCRIT: 27.8 % — AB (ref 39.0–52.0)
HEMOGLOBIN: 8.8 g/dL — AB (ref 13.0–17.0)
MCH: 28.3 pg (ref 26.0–34.0)
MCHC: 31.7 g/dL (ref 30.0–36.0)
MCV: 89.4 fL (ref 78.0–100.0)
Platelets: 264 10*3/uL (ref 150–400)
RBC: 3.11 MIL/uL — AB (ref 4.22–5.81)
RDW: 14.7 % (ref 11.5–15.5)
WBC: 4 10*3/uL (ref 4.0–10.5)

## 2014-06-01 LAB — BASIC METABOLIC PANEL
Anion gap: 10 (ref 5–15)
BUN: 20 mg/dL (ref 6–23)
CO2: 28 mEq/L (ref 19–32)
Calcium: 8.5 mg/dL (ref 8.4–10.5)
Chloride: 103 mEq/L (ref 96–112)
Creatinine, Ser: 1.21 mg/dL (ref 0.50–1.35)
GFR calc Af Amer: 61 mL/min — ABNORMAL LOW (ref >90)
GFR calc non Af Amer: 52 mL/min — ABNORMAL LOW (ref >90)
GLUCOSE: 147 mg/dL — AB (ref 70–99)
Potassium: 4 mEq/L (ref 3.7–5.3)
Sodium: 141 mEq/L (ref 137–147)

## 2014-06-01 MED ORDER — ZOLPIDEM TARTRATE 5 MG PO TABS
5.0000 mg | ORAL_TABLET | Freq: Once | ORAL | Status: AC
  Administered 2014-06-01: 5 mg via ORAL
  Filled 2014-06-01: qty 1

## 2014-06-01 MED ORDER — SODIUM CHLORIDE 0.9 % IV SOLN
INTRAVENOUS | Status: DC
  Administered 2014-06-02: 500 mL via INTRAVENOUS

## 2014-06-01 NOTE — Progress Notes (Signed)
Patient ID: Omar Chan  male  ZOX:[id number]    DOB: 1926-04-04    DOA: 05/30/2014  PCP: [name],[name], MD  Brief history of present illness  Patient is a 80 year old male with history of A. Fib, diabetes, hypertension, GERD presented with fatigue and weakness. He had recent hospitalization and was discharged on 10/30 and had presented with GI bleedwhich was thought to be secondary to elevated INR, resolved after correction of INR.he did not undergo any endoscopy evaluation during the previous admission. At the scheduled follow-up, with his PCP, his hemoglobin was 7.5 and patient was sent back to the ER. FOBT is positive.   Assessment/Plan: Principal Problem:   Symptomatic anemia - hemoglobin 8.8 - FOBT positive, - Continue to hold Coumadin, call gastroenterology consult, patient was seen by [name], recommended EGD in a.m., 11/5   Active Problems:   Diabetes mellitus - continue sliding-scale insulin    Hypertension - stable  DVT Prophylaxis: SCD  Code Status:DNR  Family Communication:discussed in detail with patient's daughter at the bedside  Disposition:  Consultants:  Gastroenterology  Procedures:  None   Antibiotics:  none    Subjective: Patient seen and examined, denies any specific complaints, no acute gross bleeding  Objective: Weight change: -2.049 kg (-4 lb 8.3 oz)  Intake/Output Summary (Last 24 hours) at 06/01/14 1051 Last data filed at 06/01/14 0600  Gross per 24 hour  Intake    120 ml  Output    300 ml  Net   -180 ml   Blood pressure 104/82, pulse 64, temperature 98.6 F (37 C), temperature source Oral, resp. rate 21, height 6' (1.829 m), weight 116 kg (255 lb 11.7 oz), SpO2 96 %.  Physical Exam: General: Alert and awake, oriented x3, not in any acute distress. CVS: S1-S2 clear, no murmur rubs or gallops Chest: clear to auscultation bilaterally, no wheezing, rales or rhonchi Abdomen: soft, obese nontender, nondistended, normal  bowel sounds  Extremities: no cyanosis, clubbing or edema noted bilaterally Neuro: Cranial nerves II-XII intact, no focal neurological deficits  Lab Results: Basic Metabolic Panel:  Recent Labs Lab 05/31/14 0614 06/01/14 0550  NA 143 141  K 3.9 4.0  CL 105 103  CO2 26 28  GLUCOSE 133* 147*  BUN 19 20  CREATININE 1.10 1.21  CALCIUM 8.7 8.5   Liver Function Tests:  Recent Labs Lab 05/30/14 1659 05/31/14 0614  AST 16 15  ALT 14 13  ALKPHOS 76 67  BILITOT <0.2* 0.2*  PROT 6.2 5.8*  ALBUMIN 3.1* 3.0*   No results for input(s): LIPASE, AMYLASE in the last 168 hours. No results for input(s): AMMONIA in the last 168 hours. CBC:  Recent Labs Lab 05/30/14 1659 05/31/14 0614 06/01/14 0550  WBC 5.0 5.1 4.0  NEUTROABS 3.4  --   --   HGB 9.0* 9.1* 8.8*  HCT 29.8* 28.4* 27.8*  MCV 90.6 88.8 89.4  PLT 290 256 264   Cardiac Enzymes:  Recent Labs Lab 05/30/14 2059  TROPONINI <0.30   BNP: Invalid input(s): POCBNP CBG:  Recent Labs Lab 05/31/14 0738 05/31/14 1138 05/31/14 1658 05/31/14 2117 06/01/14 0802  GLUCAP 143* 140* 190* 172* 148*     Micro Results: No results found for this or any previous visit (from the past 240 hour(s)).  Studies/Results: No results found.  Medications: Scheduled Meds: . amiodarone  200 mg Oral Daily  . atorvastatin  40 mg Oral q morning - 10a  . fenofibrate  160 mg Oral Daily  .  furosemide  40 mg Intravenous Daily  . insulin aspart  0-15 Units Subcutaneous TID WC  . insulin aspart  0-5 Units Subcutaneous QHS  . lisinopril  20 mg Oral q morning - 10a  . pantoprazole  40 mg Oral Daily  . sodium chloride  3 mL Intravenous Q12H  . tiotropium  18 mcg Inhalation Daily      LOS: 2 days   RAI,RIPUDEEP M.D. [name] 06/01/2014, 10:51 AM Pager: [phone]  If 7PM-7AM, please contact night-coverage [email or web address] Password [email or web address]

## 2014-06-01 NOTE — Consult Note (Signed)
[facility] Consult Note  Referring Provider: No ref. provider found Primary Care Physician:  Gennette Pac, MD Primary Gastroenterologist:  Dr.  Laurel Dimmer Complaint: black stools and anemia HPI: Omar Chan is an 80 y.o. white  male  Easily discharged for anemia and dark heme positive stools. He was on Coumadin for atrial fibrillation and had an INR of 3. It was noted that he had an EGD showing a large hiatal hernia in 2010 and a colonoscopy showing a single cecal AVM in 2010. It was elected not to pursue any endoscopic reevaluation and he was transfused 2 units of blood and went home with a hemoglobin greater than 9. After 4 days later he saw his primary care physician and had a fingerstick hemoglobin of 7.5 and was told to come back to hospital. His INR is now normal. He denies any abdominal pain nausea vomiting dysphagia early satiety or bright red blood per rectum  Past Medical History  Diagnosis Date  . Paroxysmal atrial fibrillation     cardioversion 01/25/10  . Diabetes mellitus   . Exogenous obesity   . Hypertension   . Microcytic anemia   . Hyperlipidemia   . GERD (gastroesophageal reflux disease)   . Hiatal hernia   . Osteoarthritis     Past Surgical History  Procedure Laterality Date  . Total knee arthroplasty      B  . Cardioversion  01/25/10  . Cardiovascular stress test  2007    No ischemia. EF 61%  . US echocardiography  February 2011    Normal EF; LAE and RVE  . Cardioversion N/A 08/19/2013    Procedure: CARDIOVERSION;  Surgeon: [name], MD;  Location: [facility] ENDOSCOPY;  Service: Cardiovascular;  Laterality: N/A;    Medications Prior to Admission  Medication Sig Dispense Refill  . acetaminophen (TYLENOL) 500 MG tablet Take 1,000 mg by mouth every 6 (six) hours as needed. For pain    . amiodarone (PACERONE) 200 MG tablet Take 1 tablet (200 mg total) by mouth daily. 90 tablet 3  . atorvastatin (LIPITOR) 80 MG tablet Take 0.5 tablets (40 mg  total) by mouth every morning. 15 tablet 6  . furosemide (LASIX) 40 MG tablet Take 80 mg by mouth daily.     Marland Kitchen gemfibrozil (LOPID) 600 MG tablet Take 600 mg by mouth 2 (two) times daily.     Marland Kitchen lisinopril (PRINIVIL,ZESTRIL) 40 MG tablet Take 20 mg by mouth every morning.     . metFORMIN (GLUCOPHAGE) 500 MG tablet Take 500-1,000 mg by mouth 2 (two) times daily with a meal. 1 tab in the am and 2 tabs in the pm    . metolazone (ZAROXOLYN) 5 MG tablet Take 5 mg by mouth 2 (two) times a week. Takes 72m on Mon and Thurs only    . Probiotic Product (ALIGN) 4 MG CAPS Take 4 mg by mouth daily.    .Marland Kitchentiotropium (SPIRIVA) 18 MCG inhalation capsule Place 18 mcg into inhaler and inhale daily.    .Marland Kitchenwarfarin (COUMADIN) 5 MG tablet Take 0.5-1 tablets (2.5-5 mg total) by mouth daily. Monday Wednesday Friday patient takes 2.5 mg all other days is 5 mg      Allergies:  Allergies  Allergen Reactions  . Aspirin Hives    Family History  Problem Relation Age of Onset  . Cancer Mother     deceased  . Pneumonia Father     deceased  . ALS Brother     deceased  . Cancer  Brother     deceased/bone ca  . Cancer Sister     deceased    Social History:  reports that he quit smoking about 32 years ago. He does not have any smokeless tobacco history on file. He reports that he does not drink alcohol or use illicit drugs.  Review of Systems: negative except as above   Blood pressure 134/51, pulse 53, temperature 97.7 F (36.5 C), temperature source Oral, resp. rate 20, height 6' (1.829 m), weight 116 kg (255 lb 11.7 oz), SpO2 96 %. Head: Normocephalic, without obvious abnormality, atraumatic Neck: no adenopathy, no carotid bruit, no JVD, supple, symmetrical, trachea midline and thyroid not enlarged, symmetric, no tenderness/mass/nodules Resp: clear to auscultation bilaterally Cardio: regular rate and rhythm, S1, S2 normal, no murmur, click, rub or gallop GI: obese nontender with normoactive bowel  sounds. Extremities: extremities normal, atraumatic, no cyanosis or edema  Results for orders placed or performed during the hospital encounter of 05/30/14 (from the past 48 hour(s))  CBC with Differential     Status: Abnormal   Collection Time: 05/30/14  4:59 PM  Result Value Ref Range   WBC 5.0 4.0 - 10.5 K/uL   RBC 3.29 (L) 4.22 - 5.81 MIL/uL   Hemoglobin 9.0 (L) 13.0 - 17.0 g/dL   HCT 29.8 (L) 39.0 - 52.0 %   MCV 90.6 78.0 - 100.0 fL   MCH 27.4 26.0 - 34.0 pg   MCHC 30.2 30.0 - 36.0 g/dL   RDW 15.1 11.5 - 15.5 %   Platelets 290 150 - 400 K/uL   Neutrophils Relative % 68 43 - 77 %   Neutro Abs 3.4 1.7 - 7.7 K/uL   Lymphocytes Relative 19 12 - 46 %   Lymphs Abs 1.0 0.7 - 4.0 K/uL   Monocytes Relative 11 3 - 12 %   Monocytes Absolute 0.5 0.1 - 1.0 K/uL   Eosinophils Relative 2 0 - 5 %   Eosinophils Absolute 0.1 0.0 - 0.7 K/uL   Basophils Relative 0 0 - 1 %   Basophils Absolute 0.0 0.0 - 0.1 K/uL  Comprehensive metabolic panel     Status: Abnormal   Collection Time: 05/30/14  4:59 PM  Result Value Ref Range   Sodium 145 137 - 147 mEq/L   Potassium 4.9 3.7 - 5.3 mEq/L   Chloride 107 96 - 112 mEq/L   CO2 24 19 - 32 mEq/L   Glucose, Bld 160 (H) 70 - 99 mg/dL   BUN 22 6 - 23 mg/dL   Creatinine, Ser 1.18 0.50 - 1.35 mg/dL   Calcium 8.6 8.4 - 10.5 mg/dL   Total Protein 6.2 6.0 - 8.3 g/dL   Albumin 3.1 (L) 3.5 - 5.2 g/dL   AST 16 0 - 37 U/L   ALT 14 0 - 53 U/L   Alkaline Phosphatase 76 39 - 117 U/L   Total Bilirubin <0.2 (L) 0.3 - 1.2 mg/dL   GFR calc non Af Amer 54 (L) >90 mL/min   GFR calc Af Amer 63 (L) >90 mL/min    Comment: (NOTE) The eGFR has been calculated using the CKD EPI equation. This calculation has not been validated in all clinical situations. eGFR's persistently <90 mL/min signify possible Chronic Kidney Disease.    Anion gap 14 5 - 15  Protime-INR     Status: None   Collection Time: 05/30/14  4:59 PM  Result Value Ref Range   Prothrombin Time 14.5 11.6  - 15.2 seconds  INR 1.11 0.00 - 1.49  Type and screen     Status: None   Collection Time: 05/30/14  4:59 PM  Result Value Ref Range   ABO/RH(D) O NEG    Antibody Screen NEG    Sample Expiration 06/02/2014   APTT     Status: None   Collection Time: 05/30/14  4:59 PM  Result Value Ref Range   aPTT 31 24 - 37 seconds  Troponin I     Status: None   Collection Time: 05/30/14  8:59 PM  Result Value Ref Range   Troponin I <0.30 <0.30 ng/mL    Comment:        Due to the release kinetics of cTnI, a negative result within the first hours of the onset of symptoms does not rule out myocardial infarction with certainty. If myocardial infarction is still suspected, repeat the test at appropriate intervals.   POC occult blood, ED     Status: Abnormal   Collection Time: 05/30/14  9:02 PM  Result Value Ref Range   Fecal Occult Bld POSITIVE (A) NEGATIVE  Glucose, capillary     Status: Abnormal   Collection Time: 05/31/14  1:22 AM  Result Value Ref Range   Glucose-Capillary 151 (H) 70 - 99 mg/dL  Comprehensive metabolic panel     Status: Abnormal   Collection Time: 05/31/14  6:14 AM  Result Value Ref Range   Sodium 143 137 - 147 mEq/L   Potassium 3.9 3.7 - 5.3 mEq/L    Comment: DELTA CHECK NOTED   Chloride 105 96 - 112 mEq/L   CO2 26 19 - 32 mEq/L   Glucose, Bld 133 (H) 70 - 99 mg/dL   BUN 19 6 - 23 mg/dL   Creatinine, Ser 1.10 0.50 - 1.35 mg/dL   Calcium 8.7 8.4 - 10.5 mg/dL   Total Protein 5.8 (L) 6.0 - 8.3 g/dL   Albumin 3.0 (L) 3.5 - 5.2 g/dL   AST 15 0 - 37 U/L   ALT 13 0 - 53 U/L   Alkaline Phosphatase 67 39 - 117 U/L   Total Bilirubin 0.2 (L) 0.3 - 1.2 mg/dL   GFR calc non Af Amer 59 (L) >90 mL/min   GFR calc Af Amer 68 (L) >90 mL/min    Comment: (NOTE) The eGFR has been calculated using the CKD EPI equation. This calculation has not been validated in all clinical situations. eGFR's persistently <90 mL/min signify possible Chronic Kidney Disease.    Anion gap 12 5 - 15   CBC     Status: Abnormal   Collection Time: 05/31/14  6:14 AM  Result Value Ref Range   WBC 5.1 4.0 - 10.5 K/uL   RBC 3.20 (L) 4.22 - 5.81 MIL/uL   Hemoglobin 9.1 (L) 13.0 - 17.0 g/dL   HCT 28.4 (L) 39.0 - 52.0 %   MCV 88.8 78.0 - 100.0 fL   MCH 28.4 26.0 - 34.0 pg   MCHC 32.0 30.0 - 36.0 g/dL   RDW 14.9 11.5 - 15.5 %   Platelets 256 150 - 400 K/uL  Protime-INR     Status: None   Collection Time: 05/31/14  6:14 AM  Result Value Ref Range   Prothrombin Time 14.9 11.6 - 15.2 seconds   INR 1.16 0.00 - 1.49  Pro b natriuretic peptide (BNP)     Status: Abnormal   Collection Time: 05/31/14  6:14 AM  Result Value Ref Range   Pro B Natriuretic peptide (BNP) 597.3 (  H) 0 - 450 pg/mL  Glucose, capillary     Status: Abnormal   Collection Time: 05/31/14  7:38 AM  Result Value Ref Range   Glucose-Capillary 143 (H) 70 - 99 mg/dL  Glucose, capillary     Status: Abnormal   Collection Time: 05/31/14 11:38 AM  Result Value Ref Range   Glucose-Capillary 140 (H) 70 - 99 mg/dL  Glucose, capillary     Status: Abnormal   Collection Time: 05/31/14  4:58 PM  Result Value Ref Range   Glucose-Capillary 190 (H) 70 - 99 mg/dL  Glucose, capillary     Status: Abnormal   Collection Time: 05/31/14  9:17 PM  Result Value Ref Range   Glucose-Capillary 172 (H) 70 - 99 mg/dL  CBC     Status: Abnormal   Collection Time: 06/01/14  5:50 AM  Result Value Ref Range   WBC 4.0 4.0 - 10.5 K/uL   RBC 3.11 (L) 4.22 - 5.81 MIL/uL   Hemoglobin 8.8 (L) 13.0 - 17.0 g/dL   HCT 27.8 (L) 39.0 - 52.0 %   MCV 89.4 78.0 - 100.0 fL   MCH 28.3 26.0 - 34.0 pg   MCHC 31.7 30.0 - 36.0 g/dL   RDW 14.7 11.5 - 15.5 %   Platelets 264 150 - 400 K/uL  Basic metabolic panel     Status: Abnormal   Collection Time: 06/01/14  5:50 AM  Result Value Ref Range   Sodium 141 137 - 147 mEq/L   Potassium 4.0 3.7 - 5.3 mEq/L   Chloride 103 96 - 112 mEq/L   CO2 28 19 - 32 mEq/L   Glucose, Bld 147 (H) 70 - 99 mg/dL   BUN 20 6 - 23 mg/dL    Creatinine, Ser 1.21 0.50 - 1.35 mg/dL   Calcium 8.5 8.4 - 10.5 mg/dL   GFR calc non Af Amer 52 (L) >90 mL/min   GFR calc Af Amer 61 (L) >90 mL/min    Comment: (NOTE) The eGFR has been calculated using the CKD EPI equation. This calculation has not been validated in all clinical situations. eGFR's persistently <90 mL/min signify possible Chronic Kidney Disease.    Anion gap 10 5 - 15  Glucose, capillary     Status: Abnormal   Collection Time: 06/01/14  8:02 AM  Result Value Ref Range   Glucose-Capillary 148 (H) 70 - 99 mg/dL   No results found.  Assessment: Anemia and dark heme positive stools suggesting upper tract source. Plan:  We'll proceed with EGD tomorrow. Will hold off on colonoscopy unless lower source suggested.  Amear Strojny C 06/01/2014, 9:15 AM

## 2014-06-02 ENCOUNTER — Encounter (HOSPITAL_COMMUNITY): Payer: Self-pay | Admitting: Gastroenterology

## 2014-06-02 ENCOUNTER — Encounter (HOSPITAL_COMMUNITY)
Admission: EM | Disposition: A | Discharge status: Home / Self Care | Payer: Self-pay | Source: Home / Self Care | Attending: Internal Medicine

## 2014-06-02 HISTORY — PX: ESOPHAGOGASTRODUODENOSCOPY: SHX5428

## 2014-06-02 LAB — CBC
HCT: 27.7 % — ABNORMAL LOW (ref 39.0–52.0)
Hemoglobin: 8.9 g/dL — ABNORMAL LOW (ref 13.0–17.0)
MCH: 28.8 pg (ref 26.0–34.0)
MCHC: 32.1 g/dL (ref 30.0–36.0)
MCV: 89.6 fL (ref 78.0–100.0)
Platelets: 267 10*3/uL (ref 150–400)
RBC: 3.09 MIL/uL — ABNORMAL LOW (ref 4.22–5.81)
RDW: 14.7 % (ref 11.5–15.5)
WBC: 3.8 10*3/uL — ABNORMAL LOW (ref 4.0–10.5)

## 2014-06-02 LAB — GLUCOSE, CAPILLARY
Glucose-Capillary: 150 mg/dL — ABNORMAL HIGH (ref 70–99)
Glucose-Capillary: 131 mg/dL — ABNORMAL HIGH (ref 70–99)
Glucose-Capillary: 152 mg/dL — ABNORMAL HIGH (ref 70–99)
Glucose-Capillary: 173 mg/dL — ABNORMAL HIGH (ref 70–99)

## 2014-06-02 SURGERY — EGD (ESOPHAGOGASTRODUODENOSCOPY)
Anesthesia: Moderate Sedation

## 2014-06-02 MED ORDER — MIDAZOLAM HCL 5 MG/ML IJ SOLN
INTRAMUSCULAR | Status: AC
  Filled 2014-06-02: qty 1

## 2014-06-02 MED ORDER — FENTANYL CITRATE 0.05 MG/ML IJ SOLN
INTRAMUSCULAR | Status: DC | PRN
  Administered 2014-06-02: 25 ug via INTRAVENOUS

## 2014-06-02 MED ORDER — FENTANYL CITRATE 0.05 MG/ML IJ SOLN
INTRAMUSCULAR | Status: AC
  Filled 2014-06-02: qty 2

## 2014-06-02 MED ORDER — BUTAMBEN-TETRACAINE-BENZOCAINE 2-2-14 % EX AERO
INHALATION_SPRAY | CUTANEOUS | Status: DC | PRN
  Administered 2014-06-02: 2 via TOPICAL

## 2014-06-02 MED ORDER — ZOLPIDEM TARTRATE 5 MG PO TABS
5.0000 mg | ORAL_TABLET | Freq: Once | ORAL | Status: AC
  Administered 2014-06-02: 5 mg via ORAL
  Filled 2014-06-02: qty 1

## 2014-06-02 MED ORDER — MIDAZOLAM HCL 10 MG/2ML IJ SOLN
INTRAMUSCULAR | Status: DC | PRN
  Administered 2014-06-02: 2 mg via INTRAVENOUS

## 2014-06-02 NOTE — Progress Notes (Signed)
[facility] Progress Note  Subjective: No new complaints  Objective: Vital signs in last 24 hours: Temp:  [97.7 F (36.5 C)-98.1 F (36.7 C)] 98.1 F (36.7 C) (11/05 0935) Pulse Rate:  [51-60] 51 (11/05 1036) Resp:  [16-26] 16 (11/05 1036) BP: (102-183)/(40-135) 145/46 mmHg (11/05 1036) SpO2:  [94 %-100 %] 98 % (11/05 1036) Weight:  [116.4 kg (256 lb 9.9 oz)] 116.4 kg (256 lb 9.9 oz) (11/04 2100) Weight change: 0.4 kg (14.1 oz)   WU:[id number]:unchanged  Lab Results: Results for orders placed or performed during the hospital encounter of 05/30/14 (from the past 24 hour(s))  Glucose, capillary     Status: Abnormal   Collection Time: 06/01/14 12:05 PM  Result Value Ref Range   Glucose-Capillary 161 (H) 70 - 99 mg/dL  Glucose, capillary     Status: Abnormal   Collection Time: 06/01/14  4:59 PM  Result Value Ref Range   Glucose-Capillary 132 (H) 70 - 99 mg/dL  Glucose, capillary     Status: Abnormal   Collection Time: 06/01/14 10:04 PM  Result Value Ref Range   Glucose-Capillary 162 (H) 70 - 99 mg/dL  CBC     Status: Abnormal   Collection Time: 06/02/14  6:04 AM  Result Value Ref Range   WBC 3.8 (L) 4.0 - 10.5 K/uL   RBC 3.09 (L) 4.22 - 5.81 MIL/uL   Hemoglobin 8.9 (L) 13.0 - 17.0 g/dL   HCT 29.527.7 (L) 62.139.0 - 30.852.0 %   MCV 89.6 78.0 - 100.0 fL   MCH 28.8 26.0 - 34.0 pg   MCHC 32.1 30.0 - 36.0 g/dL   RDW 65.714.7 84.611.5 - 96.215.5 %   Platelets 267 150 - 400 K/uL  Glucose, capillary     Status: Abnormal   Collection Time: 06/02/14  7:55 AM  Result Value Ref Range   Glucose-Capillary 150 (H) 70 - 99 mg/dL    Studies/Results: EGD showed a large hiatal hernia no obvious bleeding source   Assessment:anemia and heme positive stool while on Coumadin, with fingerstick hemoglobin of 7.9 prompting recurrent admission, unsure whether true rebleeding given that hemoglobin was 9.2 when rechecked, source of original bleed unclear but was over anticoagulated with Coumadin at the  time. Plan: I would recommend empiric PPI therapy for any missed upper GI or small bowel lesions. The patient and his family are not interested in colonoscopy unless absolutely necessary. Whether to hold her resume Coumadin will depend on the risk/benefit perceived by cardiologist or primary physician. I would have no objection with cautiously restart again while monitoring stools and hemoglobin closely.    Omar Chan C 06/02/2014, 10:39 AM

## 2014-06-02 NOTE — H&P (View-Only) (Signed)
[facility] Consult Note  Referring Provider: No ref. provider found Primary Care Physician:  Gennette Pac, MD Primary Gastroenterologist:  Dr.  Laurel Dimmer Complaint: black stools and anemia HPI: Omar Chan is an 80 y.o. white  male  Easily discharged for anemia and dark heme positive stools. He was on Coumadin for atrial fibrillation and had an INR of 3. It was noted that he had an EGD showing a large hiatal hernia in 2010 and a colonoscopy showing a single cecal AVM in 2010. It was elected not to pursue any endoscopic reevaluation and he was transfused 2 units of blood and went home with a hemoglobin greater than 9. After 4 days later he saw his primary care physician and had a fingerstick hemoglobin of 7.5 and was told to come back to hospital. His INR is now normal. He denies any abdominal pain nausea vomiting dysphagia early satiety or bright red blood per rectum  Past Medical History  Diagnosis Date  . Paroxysmal atrial fibrillation     cardioversion 01/25/10  . Diabetes mellitus   . Exogenous obesity   . Hypertension   . Microcytic anemia   . Hyperlipidemia   . GERD (gastroesophageal reflux disease)   . Hiatal hernia   . Osteoarthritis     Past Surgical History  Procedure Laterality Date  . Total knee arthroplasty      B  . Cardioversion  01/25/10  . Cardiovascular stress test  2007    No ischemia. EF 61%  . US echocardiography  February 2011    Normal EF; LAE and RVE  . Cardioversion N/A 08/19/2013    Procedure: CARDIOVERSION;  Surgeon: [name], MD;  Location: [facility] ENDOSCOPY;  Service: Cardiovascular;  Laterality: N/A;    Medications Prior to Admission  Medication Sig Dispense Refill  . acetaminophen (TYLENOL) 500 MG tablet Take 1,000 mg by mouth every 6 (six) hours as needed. For pain    . amiodarone (PACERONE) 200 MG tablet Take 1 tablet (200 mg total) by mouth daily. 90 tablet 3  . atorvastatin (LIPITOR) 80 MG tablet Take 0.5 tablets (40 mg  total) by mouth every morning. 15 tablet 6  . furosemide (LASIX) 40 MG tablet Take 80 mg by mouth daily.     Marland Kitchen gemfibrozil (LOPID) 600 MG tablet Take 600 mg by mouth 2 (two) times daily.     Marland Kitchen lisinopril (PRINIVIL,ZESTRIL) 40 MG tablet Take 20 mg by mouth every morning.     . metFORMIN (GLUCOPHAGE) 500 MG tablet Take 500-1,000 mg by mouth 2 (two) times daily with a meal. 1 tab in the am and 2 tabs in the pm    . metolazone (ZAROXOLYN) 5 MG tablet Take 5 mg by mouth 2 (two) times a week. Takes 5m on Mon and Thurs only    . Probiotic Product (ALIGN) 4 MG CAPS Take 4 mg by mouth daily.    .Marland Kitchentiotropium (SPIRIVA) 18 MCG inhalation capsule Place 18 mcg into inhaler and inhale daily.    .Marland Kitchenwarfarin (COUMADIN) 5 MG tablet Take 0.5-1 tablets (2.5-5 mg total) by mouth daily. Monday Wednesday Friday patient takes 2.5 mg all other days is 5 mg      Allergies:  Allergies  Allergen Reactions  . Aspirin Hives    Family History  Problem Relation Age of Onset  . Cancer Mother     deceased  . Pneumonia Father     deceased  . ALS Brother     deceased  . Cancer  Brother     deceased/bone ca  . Cancer Sister     deceased    Social History:  reports that he quit smoking about 32 years ago. He does not have any smokeless tobacco history on file. He reports that he does not drink alcohol or use illicit drugs.  Review of Systems: negative except as above   Blood pressure 134/51, pulse 53, temperature 97.7 F (36.5 C), temperature source Oral, resp. rate 20, height 6' (1.829 m), weight 116 kg (255 lb 11.7 oz), SpO2 96 %. Head: Normocephalic, without obvious abnormality, atraumatic Neck: no adenopathy, no carotid bruit, no JVD, supple, symmetrical, trachea midline and thyroid not enlarged, symmetric, no tenderness/mass/nodules Resp: clear to auscultation bilaterally Cardio: regular rate and rhythm, S1, S2 normal, no murmur, click, rub or gallop GI: obese nontender with normoactive bowel  sounds. Extremities: extremities normal, atraumatic, no cyanosis or edema  Results for orders placed or performed during the hospital encounter of 05/30/14 (from the past 48 hour(s))  CBC with Differential     Status: Abnormal   Collection Time: 05/30/14  4:59 PM  Result Value Ref Range   WBC 5.0 4.0 - 10.5 K/uL   RBC 3.29 (L) 4.22 - 5.81 MIL/uL   Hemoglobin 9.0 (L) 13.0 - 17.0 g/dL   HCT 29.8 (L) 39.0 - 52.0 %   MCV 90.6 78.0 - 100.0 fL   MCH 27.4 26.0 - 34.0 pg   MCHC 30.2 30.0 - 36.0 g/dL   RDW 15.1 11.5 - 15.5 %   Platelets 290 150 - 400 K/uL   Neutrophils Relative % 68 43 - 77 %   Neutro Abs 3.4 1.7 - 7.7 K/uL   Lymphocytes Relative 19 12 - 46 %   Lymphs Abs 1.0 0.7 - 4.0 K/uL   Monocytes Relative 11 3 - 12 %   Monocytes Absolute 0.5 0.1 - 1.0 K/uL   Eosinophils Relative 2 0 - 5 %   Eosinophils Absolute 0.1 0.0 - 0.7 K/uL   Basophils Relative 0 0 - 1 %   Basophils Absolute 0.0 0.0 - 0.1 K/uL  Comprehensive metabolic panel     Status: Abnormal   Collection Time: 05/30/14  4:59 PM  Result Value Ref Range   Sodium 145 137 - 147 mEq/L   Potassium 4.9 3.7 - 5.3 mEq/L   Chloride 107 96 - 112 mEq/L   CO2 24 19 - 32 mEq/L   Glucose, Bld 160 (H) 70 - 99 mg/dL   BUN 22 6 - 23 mg/dL   Creatinine, Ser 1.18 0.50 - 1.35 mg/dL   Calcium 8.6 8.4 - 10.5 mg/dL   Total Protein 6.2 6.0 - 8.3 g/dL   Albumin 3.1 (L) 3.5 - 5.2 g/dL   AST 16 0 - 37 U/L   ALT 14 0 - 53 U/L   Alkaline Phosphatase 76 39 - 117 U/L   Total Bilirubin <0.2 (L) 0.3 - 1.2 mg/dL   GFR calc non Af Amer 54 (L) >90 mL/min   GFR calc Af Amer 63 (L) >90 mL/min    Comment: (NOTE) The eGFR has been calculated using the CKD EPI equation. This calculation has not been validated in all clinical situations. eGFR's persistently <90 mL/min signify possible Chronic Kidney Disease.    Anion gap 14 5 - 15  Protime-INR     Status: None   Collection Time: 05/30/14  4:59 PM  Result Value Ref Range   Prothrombin Time 14.5 11.6  - 15.2 seconds  INR 1.11 0.00 - 1.49  Type and screen     Status: None   Collection Time: 05/30/14  4:59 PM  Result Value Ref Range   ABO/RH(D) O NEG    Antibody Screen NEG    Sample Expiration 06/02/2014   APTT     Status: None   Collection Time: 05/30/14  4:59 PM  Result Value Ref Range   aPTT 31 24 - 37 seconds  Troponin I     Status: None   Collection Time: 05/30/14  8:59 PM  Result Value Ref Range   Troponin I <0.30 <0.30 ng/mL    Comment:        Due to the release kinetics of cTnI, a negative result within the first hours of the onset of symptoms does not rule out myocardial infarction with certainty. If myocardial infarction is still suspected, repeat the test at appropriate intervals.   POC occult blood, ED     Status: Abnormal   Collection Time: 05/30/14  9:02 PM  Result Value Ref Range   Fecal Occult Bld POSITIVE (A) NEGATIVE  Glucose, capillary     Status: Abnormal   Collection Time: 05/31/14  1:22 AM  Result Value Ref Range   Glucose-Capillary 151 (H) 70 - 99 mg/dL  Comprehensive metabolic panel     Status: Abnormal   Collection Time: 05/31/14  6:14 AM  Result Value Ref Range   Sodium 143 137 - 147 mEq/L   Potassium 3.9 3.7 - 5.3 mEq/L    Comment: DELTA CHECK NOTED   Chloride 105 96 - 112 mEq/L   CO2 26 19 - 32 mEq/L   Glucose, Bld 133 (H) 70 - 99 mg/dL   BUN 19 6 - 23 mg/dL   Creatinine, Ser 1.10 0.50 - 1.35 mg/dL   Calcium 8.7 8.4 - 10.5 mg/dL   Total Protein 5.8 (L) 6.0 - 8.3 g/dL   Albumin 3.0 (L) 3.5 - 5.2 g/dL   AST 15 0 - 37 U/L   ALT 13 0 - 53 U/L   Alkaline Phosphatase 67 39 - 117 U/L   Total Bilirubin 0.2 (L) 0.3 - 1.2 mg/dL   GFR calc non Af Amer 59 (L) >90 mL/min   GFR calc Af Amer 68 (L) >90 mL/min    Comment: (NOTE) The eGFR has been calculated using the CKD EPI equation. This calculation has not been validated in all clinical situations. eGFR's persistently <90 mL/min signify possible Chronic Kidney Disease.    Anion gap 12 5 - 15   CBC     Status: Abnormal   Collection Time: 05/31/14  6:14 AM  Result Value Ref Range   WBC 5.1 4.0 - 10.5 K/uL   RBC 3.20 (L) 4.22 - 5.81 MIL/uL   Hemoglobin 9.1 (L) 13.0 - 17.0 g/dL   HCT 28.4 (L) 39.0 - 52.0 %   MCV 88.8 78.0 - 100.0 fL   MCH 28.4 26.0 - 34.0 pg   MCHC 32.0 30.0 - 36.0 g/dL   RDW 14.9 11.5 - 15.5 %   Platelets 256 150 - 400 K/uL  Protime-INR     Status: None   Collection Time: 05/31/14  6:14 AM  Result Value Ref Range   Prothrombin Time 14.9 11.6 - 15.2 seconds   INR 1.16 0.00 - 1.49  Pro b natriuretic peptide (BNP)     Status: Abnormal   Collection Time: 05/31/14  6:14 AM  Result Value Ref Range   Pro B Natriuretic peptide (BNP) 597.3 (  H) 0 - 450 pg/mL  Glucose, capillary     Status: Abnormal   Collection Time: 05/31/14  7:38 AM  Result Value Ref Range   Glucose-Capillary 143 (H) 70 - 99 mg/dL  Glucose, capillary     Status: Abnormal   Collection Time: 05/31/14 11:38 AM  Result Value Ref Range   Glucose-Capillary 140 (H) 70 - 99 mg/dL  Glucose, capillary     Status: Abnormal   Collection Time: 05/31/14  4:58 PM  Result Value Ref Range   Glucose-Capillary 190 (H) 70 - 99 mg/dL  Glucose, capillary     Status: Abnormal   Collection Time: 05/31/14  9:17 PM  Result Value Ref Range   Glucose-Capillary 172 (H) 70 - 99 mg/dL  CBC     Status: Abnormal   Collection Time: 06/01/14  5:50 AM  Result Value Ref Range   WBC 4.0 4.0 - 10.5 K/uL   RBC 3.11 (L) 4.22 - 5.81 MIL/uL   Hemoglobin 8.8 (L) 13.0 - 17.0 g/dL   HCT 27.8 (L) 39.0 - 52.0 %   MCV 89.4 78.0 - 100.0 fL   MCH 28.3 26.0 - 34.0 pg   MCHC 31.7 30.0 - 36.0 g/dL   RDW 14.7 11.5 - 15.5 %   Platelets 264 150 - 400 K/uL  Basic metabolic panel     Status: Abnormal   Collection Time: 06/01/14  5:50 AM  Result Value Ref Range   Sodium 141 137 - 147 mEq/L   Potassium 4.0 3.7 - 5.3 mEq/L   Chloride 103 96 - 112 mEq/L   CO2 28 19 - 32 mEq/L   Glucose, Bld 147 (H) 70 - 99 mg/dL   BUN 20 6 - 23 mg/dL    Creatinine, Ser 1.21 0.50 - 1.35 mg/dL   Calcium 8.5 8.4 - 10.5 mg/dL   GFR calc non Af Amer 52 (L) >90 mL/min   GFR calc Af Amer 61 (L) >90 mL/min    Comment: (NOTE) The eGFR has been calculated using the CKD EPI equation. This calculation has not been validated in all clinical situations. eGFR's persistently <90 mL/min signify possible Chronic Kidney Disease.    Anion gap 10 5 - 15  Glucose, capillary     Status: Abnormal   Collection Time: 06/01/14  8:02 AM  Result Value Ref Range   Glucose-Capillary 148 (H) 70 - 99 mg/dL   No results found.  Assessment: Anemia and dark heme positive stools suggesting upper tract source. Plan:  We'll proceed with EGD tomorrow. Will hold off on colonoscopy unless lower source suggested.  Majd Tissue C 06/01/2014, 9:15 AM

## 2014-06-02 NOTE — Op Note (Signed)
[facility] [address] [place], [id number]   ENDOSCOPY PROCEDURE REPORT  PATIENT: Omar Chan, [name]  MR#: [id number] BIRTHDATE: [birth year] , 88  yrs. old GENDER: male ENDOSCOPIST:Mikell Camp Madilyn FiremanHayes, MD REFERRED BY: PROCEDURE DATE:  06/02/2014 PROCEDURE: ASA CLASS: INDICATIONS: anemia and heme positive stool MEDICATION: fentanyl 25 g Versed 2 mg TOPICAL ANESTHETIC:   E cane spray  DESCRIPTION OF PROCEDURE:   After the risks and benefits of the procedure were explained, informed consent was obtained.  The Pentax Gastroscope [id number]  endoscope was introduced through the mouth  and advanced to the second portion of the duodenum .  The instrument was slowly withdrawn as the mucosa was fully examined.    esophagus: 4 cm hiatal hernia and no Cameron erosions ulcers or esophagitis Stomach: Normal Duodenum: Normal               The scope was then withdrawn from the patient and the procedure completed.  COMPLICATIONS: There were no immediate complications.  ENDOSCOPIC IMPRESSION:large hiatal hernia otherwise normal study  RECOMMENDATIONS: treat empirically with proton pump inhibitor monitor stools and hemoglobin for further bleeding. If persists consider colonoscopy and/or capsule endoscopy   _______________________________ eSigned:  Dorena CookeyJohn Maricarmen Braziel, MD 06/02/2014 10:31 AM     cc:  CPT CODES: ICD CODES:  The ICD and CPT codes recommended by this software are interpretations from the data that the clinical staff has captured with the software.  The verification of the translation of this report to the ICD and CPT codes and modifiers is the sole responsibility of the health care institution and practicing physician where this report was generated.  [employer] will not be held responsible for the validity of the ICD and CPT codes included on this report.  AMA assumes no liability for data contained or not contained herein. CPT is a  Publishing rights managerregistered trademark of the [employer].

## 2014-06-02 NOTE — Progress Notes (Signed)
Patient ID: Omar Chan  male  ZOX:[id number]    DOB: May 23, 1926    DOA: 05/30/2014  PCP: [name],[name], MD  Brief history of present illness  Patient is a 80 year old male with history of A. Fib, diabetes, hypertension, GERD presented with fatigue and weakness. He had recent hospitalization and was discharged on 10/30 and had presented with GI bleedwhich was thought to be secondary to elevated INR, resolved after correction of INR.he did not undergo any endoscopy evaluation during the previous admission. At the scheduled follow-up, with his PCP, his hemoglobin was 7.5 and patient was sent back to the ER. FOBT is positive.  gastroenterology consulted, EGD planned 11/5  Assessment/Plan: Principal Problem:   Symptomatic anemia - hemoglobin 8.8 - FOBT positive, - Continue to hold Coumadin - gastroenterology consulted, patient underwent EGD which showed a large hiatal hernia, no active bleeding, recommended PPI - Per Gastroenterology, [name], patient and his family are not interested in colonoscopy unless absolutely necessary. Recommended to discuss with PCP or cardiology whether to resume Coumadin depends on risks/benefits, no objection with cautiously restarting again and monitoring stools and hemoglobin. - We will observe overnight, if CBC stable, DC home in a.m.  Active Problems:   Diabetes mellitus - continue sliding-scale insulin    Hypertension - stable  DVT Prophylaxis: SCD  Code Status:DNR  Family Communication:discussed in detail with patient's daughter and multiple family members at the bedside at the bedside  Disposition:in a.m.  Consultants:  Gastroenterology  Procedures:  None   Antibiotics:  none    Subjective: Patient seen and examined in a.m., awaiting for EGD  Objective: Weight change: 0.4 kg (14.1 oz)  Intake/Output Summary (Last 24 hours) at 06/02/14 1214 Last data filed at 06/02/14 0600  Gross per 24 hour  Intake    600 ml  Output       0 ml  Net    600 ml   Blood pressure 156/52, pulse 49, temperature 98.4 F (36.9 C), temperature source Oral, resp. rate 13, height 6' (1.829 m), weight 116.4 kg (256 lb 9.9 oz), SpO2 97 %.  Physical Exam: General: Alert and awake, oriented x3, not in any acute distress. CVS: S1-S2 clear, no murmur rubs or gallops Chest: clear to auscultation bilaterally Abdomen: soft, obese nontender, nondistended, normal bowel sounds  Extremities: no cyanosis, clubbing or edema noted bilaterally   Lab Results: Basic Metabolic Panel:  Recent Labs Lab 05/31/14 0614 06/01/14 0550  NA 143 141  K 3.9 4.0  CL 105 103  CO2 26 28  GLUCOSE 133* 147*  BUN 19 20  CREATININE 1.10 1.21  CALCIUM 8.7 8.5   Liver Function Tests:  Recent Labs Lab 05/30/14 1659 05/31/14 0614  AST 16 15  ALT 14 13  ALKPHOS 76 67  BILITOT <0.2* 0.2*  PROT 6.2 5.8*  ALBUMIN 3.1* 3.0*   No results for input(s): LIPASE, AMYLASE in the last 168 hours. No results for input(s): AMMONIA in the last 168 hours. CBC:  Recent Labs Lab 05/30/14 1659  06/01/14 0550 06/02/14 0604  WBC 5.0  < > 4.0 3.8*  NEUTROABS 3.4  --   --   --   HGB 9.0*  < > 8.8* 8.9*  HCT 29.8*  < > 27.8* 27.7*  MCV 90.6  < > 89.4 89.6  PLT 290  < > 264 267  < > = values in this interval not displayed. Cardiac Enzymes:  Recent Labs Lab 05/30/14 2059  TROPONINI <0.30   BNP:  Invalid input(s): POCBNP CBG:  Recent Labs Lab 06/01/14 1205 06/01/14 1659 06/01/14 2204 06/02/14 0755 06/02/14 1138  GLUCAP 161* 132* 162* 150* 131*     Micro Results: No results found for this or any previous visit (from the past 240 hour(s)).  Studies/Results: No results found.  Medications: Scheduled Meds: . amiodarone  200 mg Oral Daily  . atorvastatin  40 mg Oral q morning - 10a  . fenofibrate  160 mg Oral Daily  . furosemide  40 mg Intravenous Daily  . insulin aspart  0-15 Units Subcutaneous TID WC  . insulin aspart  0-5 Units  Subcutaneous QHS  . lisinopril  20 mg Oral q morning - 10a  . pantoprazole  40 mg Oral Daily  . sodium chloride  3 mL Intravenous Q12H  . tiotropium  18 mcg Inhalation Daily      LOS: 3 days   Oluwadamilare Tobler M.D. [name] 06/02/2014, 12:14 PM Pager: [phone]  If 7PM-7AM, please contact night-coverage [email or web address] Password [email or web address]

## 2014-06-02 NOTE — Interval H&P Note (Signed)
History and Physical Interval Note:  06/02/2014 10:09 AM  Omar Chan  has presented today for surgery, with the diagnosis of GI bleeding  The various methods of treatment have been discussed with the patient and family. After consideration of risks, benefits and other options for treatment, the patient has consented to  Procedure(s): ESOPHAGOGASTRODUODENOSCOPY (EGD) (N/A) as a surgical intervention .  The patient's history has been reviewed, patient examined, no change in status, stable for surgery.  I have reviewed the patient's chart and labs.  Questions were answered to the patient's satisfaction.     Tucker Minter C

## 2014-06-03 ENCOUNTER — Encounter (HOSPITAL_COMMUNITY): Payer: Self-pay | Admitting: Gastroenterology

## 2014-06-03 DIAGNOSIS — I4891 Unspecified atrial fibrillation: Secondary | ICD-10-CM

## 2014-06-03 DIAGNOSIS — I480 Paroxysmal atrial fibrillation: Secondary | ICD-10-CM

## 2014-06-03 LAB — CBC
HEMATOCRIT: 28.9 % — AB (ref 39.0–52.0)
HEMOGLOBIN: 9.3 g/dL — AB (ref 13.0–17.0)
MCH: 28.5 pg (ref 26.0–34.0)
MCHC: 32.2 g/dL (ref 30.0–36.0)
MCV: 88.7 fL (ref 78.0–100.0)
Platelets: 301 10*3/uL (ref 150–400)
RBC: 3.26 MIL/uL — AB (ref 4.22–5.81)
RDW: 14.6 % (ref 11.5–15.5)
WBC: 4.4 10*3/uL (ref 4.0–10.5)

## 2014-06-03 LAB — GLUCOSE, CAPILLARY: Glucose-Capillary: 139 mg/dL — ABNORMAL HIGH (ref 70–99)

## 2014-06-03 MED ORDER — PANTOPRAZOLE SODIUM 40 MG PO TBEC: 40.0000 mg | DELAYED_RELEASE_TABLET | Freq: Every day | ORAL | Status: DC

## 2014-06-03 MED ORDER — CLOPIDOGREL BISULFATE 75 MG PO TABS
75.0000 mg | ORAL_TABLET | Freq: Every day | ORAL | Status: DC
  Filled 2014-06-03: qty 1

## 2014-06-03 MED ORDER — CLOPIDOGREL BISULFATE 75 MG PO TABS: 75.0000 mg | ORAL_TABLET | Freq: Every day | ORAL | Status: DC

## 2014-06-03 NOTE — Plan of Care (Signed)
Problem: Phase II Progression Outcomes Goal: Obtain order to discontinue catheter if appropriate Outcome: Not Applicable Date Met:  06/03/14     

## 2014-06-03 NOTE — Consult Note (Signed)
Primary cardiologist: Patty Sermons  HPI: 80 year old male with past medical history of paroxysmal atrial fibrillation for evaluation of anemia and need for Coumadin. Patient has had previous cardioversion of atrial fibrillation in January 2015. Last echocardiogram at that time showed normal LV function, severe left atrial enlargement, mild right atrial enlargement, mild to moderate tricuspid regurgitation and moderate elevation in pulmonary pressures. He has had problems with intermittent bradycardia related to AV nodal blocking agents. He was recently admitted with weakness and his hemoglobin was noted to be 7.5 which was decreased from 11.1. He had heme positive stool. He was transfused 2 units of packed red blood cells. He was seen by gastroenterology but declined endoscopy. 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 Patient was discharged with a hemoglobin of 9. He saw his primary care physician recently and hemoglobin noted to be 7.5 on fingerstick. He was sent to the emergency room and admitted. FU Hgb 9. Patient had EGD on November 5 which revealed a large hiatal hernia but otherwise normal. Patient states that prior to his recent admission requiring transfusion he noticed increasing fatigue and dyspnea on exertion. There has been some improvement. There is no chest pain, palpitations or syncope. Chronic mild pedal edema.  Medications Prior to Admission  Medication Sig Dispense Refill  . acetaminophen (TYLENOL) 500 MG tablet Take 1,000 mg by mouth every 6 (six) hours as needed. For pain    . amiodarone (PACERONE) 200 MG tablet Take 1 tablet (200 mg total) by mouth daily. 90 tablet 3  . atorvastatin (LIPITOR) 80 MG tablet Take 0.5 tablets (40 mg total) by mouth every morning. 15 tablet 6  . furosemide (LASIX) 40 MG tablet Take 80 mg by mouth daily.     Marland Kitchen gemfibrozil (LOPID) 600 MG tablet Take 600 mg by mouth 2 (two)  times daily.     Marland Kitchen lisinopril (PRINIVIL,ZESTRIL) 40 MG tablet Take 20 mg by mouth every morning.     . metFORMIN (GLUCOPHAGE) 500 MG tablet Take 500-1,000 mg by mouth 2 (two) times daily with a meal. 1 tab in the am and 2 tabs in the pm    . metolazone (ZAROXOLYN) 5 MG tablet Take 5 mg by mouth 2 (two) times a week. Takes 5mg  on Mon and Thurs only    . Probiotic Product (ALIGN) 4 MG CAPS Take 4 mg by mouth daily.    Marland Kitchen tiotropium (SPIRIVA) 18 MCG inhalation capsule Place 18 mcg into inhaler and inhale daily.    Marland Kitchen warfarin (COUMADIN) 5 MG tablet Take 0.5-1 tablets (2.5-5 mg total) by mouth daily. Monday Wednesday Friday patient takes 2.5 mg all other days is 5 mg      Allergies  Allergen Reactions  . Aspirin Hives     Past Medical History  Diagnosis Date  . Paroxysmal atrial fibrillation     cardioversion 01/25/10  . Diabetes mellitus   . Exogenous obesity   . Hypertension   . Microcytic anemia   . Hyperlipidemia   . GERD (gastroesophageal reflux disease)   . Hiatal hernia   . Osteoarthritis     Past Surgical History  Procedure Laterality Date  . Total knee arthroplasty      B  . Cardioversion  01/25/10  . Cardiovascular stress test  2007    No ischemia. EF 61%  . US echocardiography  February 2011    Normal EF;  LAE and RVE  . Cardioversion N/A 08/19/2013    Procedure: CARDIOVERSION;  Surgeon: Cassell Clementhomas Brackbill, MD;  [facility];  Service: Cardiovascular;  Laterality: N/A;  . Esophagogastroduodenoscopy N/A 06/02/2014    Procedure: ESOPHAGOGASTRODUODENOSCOPY (EGD);  Surgeon: Barrie FolkJohn C Hayes, MD;  [facility];  Service: Endoscopy;  Laterality: N/A;    History   Social History  . Marital Status: Married    Spouse Name: N/A    Number of Children: N/A  . Years of Education: N/A   Occupational History  . Not on file.   Social History Main Topics  . Smoking status: Former Smoker    Quit date: 07/29/1981  . Smokeless tobacco: Not on file  . Alcohol Use:  No  . Drug Use: No  . Sexual Activity: Yes   Other Topics Concern  . Not on file   Social History Narrative    Family History  Problem Relation Age of Onset  . Cancer Mother     deceased  . Pneumonia Father     deceased  . ALS Brother     deceased  . Cancer Brother     deceased/bone ca  . Cancer Sister     deceased    ROS:  Black stools because of iron but no fevers or chills, productive cough, hemoptysis, dysphasia, odynophagia, hematochezia, dysuria, hematuria, rash, seizure activity, orthopnea, PND, claudication. Remaining systems are negative.  Physical Exam:   Blood pressure 129/66, pulse 66, temperature 97.8 F (36.6 C), temperature source Oral, resp. rate 17, height 6' (1.829 m), weight 252 lb 8 oz (114.533 kg), SpO2 98 %.  General:  Well developed/obese in NAD Skin warm/dry, tattoos Patient not depressed No peripheral clubbing Back-normal HEENT-normal/normal eyelids Neck supple/normal carotid upstroke bilaterally; no bruits; no JVD; no thyromegaly chest - CTA/ normal expansion CV - RRR/normal S1 and S2; no rubs or gallops;  PMI nondisplaced, 2/6 systolic murmur left sternal border. Abdomen -NT/ND, no HSM, no mass, + bowel sounds, no bruit 2+ femoral pulses, no bruits Ext-trace to 1+ edema, no chords, 2+ DP, Status post knee replacement Neuro-grossly nonfocal  ECG 05/30/2014-normal sinus rhythm, first-degree AV block.  Results for orders placed or performed during the hospital encounter of 05/30/14 (from the past 48 hour(s))  Glucose, capillary     Status: Abnormal   Collection Time: 06/01/14 12:05 PM  Result Value Ref Range   Glucose-Capillary 161 (H) 70 - 99 mg/dL  Glucose, capillary     Status: Abnormal   Collection Time: 06/01/14  4:59 PM  Result Value Ref Range   Glucose-Capillary 132 (H) 70 - 99 mg/dL  Glucose, capillary     Status: Abnormal   Collection Time: 06/01/14 10:04 PM  Result Value Ref Range   Glucose-Capillary 162 (H) 70 - 99 mg/dL    CBC     Status: Abnormal   Collection Time: 06/02/14  6:04 AM  Result Value Ref Range   WBC 3.8 (L) 4.0 - 10.5 K/uL   RBC 3.09 (L) 4.22 - 5.81 MIL/uL   Hemoglobin 8.9 (L) 13.0 - 17.0 g/dL   HCT 16.127.7 (L) 09.639.0 - 04.552.0 %   MCV 89.6 78.0 - 100.0 fL   MCH 28.8 26.0 - 34.0 pg   MCHC 32.1 30.0 - 36.0 g/dL   RDW 40.914.7 81.111.5 - 91.415.5 %   Platelets 267 150 - 400 K/uL  Glucose, capillary     Status: Abnormal   Collection Time: 06/02/14  7:55 AM  Result Value Ref Range  Glucose-Capillary 150 (H) 70 - 99 mg/dL  Glucose, capillary     Status: Abnormal   Collection Time: 06/02/14 11:38 AM  Result Value Ref Range   Glucose-Capillary 131 (H) 70 - 99 mg/dL  Glucose, capillary     Status: Abnormal   Collection Time: 06/02/14  5:22 PM  Result Value Ref Range   Glucose-Capillary 152 (H) 70 - 99 mg/dL  Glucose, capillary     Status: Abnormal   Collection Time: 06/02/14  9:25 PM  Result Value Ref Range   Glucose-Capillary 173 (H) 70 - 99 mg/dL  CBC     Status: Abnormal   Collection Time: 06/03/14  4:52 AM  Result Value Ref Range   WBC 4.4 4.0 - 10.5 K/uL   RBC 3.26 (L) 4.22 - 5.81 MIL/uL   Hemoglobin 9.3 (L) 13.0 - 17.0 g/dL   HCT 16.128.9 (L) 09.639.0 - 04.552.0 %   MCV 88.7 78.0 - 100.0 fL   MCH 28.5 26.0 - 34.0 pg   MCHC 32.2 30.0 - 36.0 g/dL   RDW 40.914.6 81.111.5 - 91.415.5 %   Platelets 301 150 - 400 K/uL  Glucose, capillary     Status: Abnormal   Collection Time: 06/03/14  7:39 AM  Result Value Ref Range   Glucose-Capillary 139 (H) 70 - 99 mg/dL    No results found.  Assessment/Plan 1 paroxysmal atrial fibrillation-the patient remains in sinus rhythm. Continue amiodarone. Long discussion today with patient and family concerning the risks and benefits of anticoagulation. His Chadsvasc score is 5 as he is greater than 80 years of age, diabetic and hypertensive. He also has a history of congestive heart failure. He would certainly benefit from continued anticoagulation long-term reducing the risk of embolic  event. However he has been admitted with symptomatic anemia requiring transfusion and is heme positive. His hemoglobin is not actively decreasing but I would favor holding Coumadin for now. I feel the risk outweighs the benefit. He does have an aspirin allergy. I would therefore treat with Plavix 75 mg daily. He can follow-up with [name] in 4-6 weeks for further discussion concerning long-term anticoagulation. The patient and family understand the higher risk of CVA off of Coumadin but I feel this is appropriate at this point. 2 chronic diastolic congestive heart failure-would resume preadmission diuretic regimen at discharge. Patient is euvolemic on examination. 3 symptomatically anemia-he will need follow-up with his primary care for further management of this. 4 hypertension-continue preadmission medications. Please call with questions.  Olga MillersBrian Crenshaw MD 06/03/2014, 9:26 AM

## 2014-06-03 NOTE — Discharge Summary (Signed)
Physician Discharge Summary  Patient ID: Omar Chan MRN: [id number] DOB/AGE: 11/06/1925 80 y.o.  Admit date: 05/30/2014 Discharge date: 06/03/2014  Primary Care Physician:  [name],[name], MD  Discharge Diagnoses:    . Symptomatic anemia . Hypertension Diabetes mellitus Chronic atrial fibrillation on Coumadin  Consults:  Gastroenterology, [name] Cardiology, [name]   Recommendations for Outpatient Follow-up:  Patient was started on Plavix per cardiology recommendations. He is recommended to hold off Coumadin on until he follows up with [name] in next 2-3 weeks. Patient's cardiologist and PCP to decide about Coumadin whether to continue or discontinue with the risk and benefits discussion with the patient.  Allergies:   Allergies  Allergen Reactions  . Aspirin Hives     Discharge Medications:   Medication List    STOP taking these medications        warfarin 5 MG tablet  Commonly known as:  COUMADIN      TAKE these medications        acetaminophen 500 MG tablet  Commonly known as:  TYLENOL  Take 1,000 mg by mouth every 6 (six) hours as needed. For pain     ALIGN 4 MG Caps  Take 4 mg by mouth daily.     amiodarone 200 MG tablet  Commonly known as:  PACERONE  Take 1 tablet (200 mg total) by mouth daily.     atorvastatin 80 MG tablet  Commonly known as:  LIPITOR  Take 0.5 tablets (40 mg total) by mouth every morning.     clopidogrel 75 MG tablet  Commonly known as:  PLAVIX  Take 1 tablet (75 mg total) by mouth daily.     furosemide 40 MG tablet  Commonly known as:  LASIX  Take 80 mg by mouth daily.     gemfibrozil 600 MG tablet  Commonly known as:  LOPID  Take 600 mg by mouth 2 (two) times daily.     lisinopril 40 MG tablet  Commonly known as:  PRINIVIL,ZESTRIL  Take 20 mg by mouth every morning.     metFORMIN 500 MG tablet  Commonly known as:  GLUCOPHAGE  Take 500-1,000 mg by mouth 2 (two) times daily with a meal. 1 tab in  the am and 2 tabs in the pm     metolazone 5 MG tablet  Commonly known as:  ZAROXOLYN  Take 5 mg by mouth 2 (two) times a week. Takes 5mg  on Mon and Thurs only     pantoprazole 40 MG tablet  Commonly known as:  PROTONIX  Take 1 tablet (40 mg total) by mouth daily.     tiotropium 18 MCG inhalation capsule  Commonly known as:  SPIRIVA  Place 18 mcg into inhaler and inhale daily.         Brief H and P: For complete details please refer to admission H and P, but in brief patient is a 80 year old male with a medical history of atrial fibrillation, diabetes, hypertension, GERD presented with complaints of fatigue and tiredness. Patient was recently hospitalized and discharged on 10/30 when he had presented with GI bleed, thought to be secondary from elevated INR, resolved after correction of the INR. Patient did not undergo any endoscopy evaluation during the previous admission. However at the scheduled follow-up with his PCP, his fingerstick hemoglobin was found to be 7.5 and patient was sent back to the ER.  Hospital Course:   Symptomatic anemia:   Hemoglobin was found to be 7.5 at PCPs office,however  it was 9.0 at the time of admission. FOBT was positive. Patient had held his Coumadin prior to this admission.INR was 1.1. Gastroenterology was consulted and patient underwent EGD which showed a large hiatal hernia but no active bleeding. Patient was recommended PPI. Cardiology was consulted and patient was seen by Dr. [name] recommended Plavix 75 mg daily for now and follow-up with Dr. Patty Sermons in 4 weeks for further discussion concerning long-term anticoagulation. Patient and the family understands the high risk of CVA off of the Coumadin. Patient's hemoglobin has remained stable and is 9.3 at the time of discharge. Follow up with gastroenterology in 3 weeks, patient and his family declined colonoscopy at this time.   Diabetes mellitus currently stable   Hypertension -  stable  Chronic atrial fibrillation: continue Plavix for now, rate controlled   Day of Discharge BP 130/53 mmHg  Pulse 63  Temp(Src) 98.1 F (36.7 C) (Oral)  Resp 18  Ht 6' (1.829 m)  Wt 114.533 kg (252 lb 8 oz)  BMI 34.24 kg/m2  SpO2 97%  Physical Exam: General: Alert and awake oriented x3 not in any acute distress. CVS: S1-S2 clear no murmur rubs or gallops Chest: clear to auscultation bilaterally, no wheezing rales or rhonchi Abdomen: soft nontender, nondistended, normal bowel sounds Extremities: no cyanosis, clubbing or edema noted bilaterally    The results of significant diagnostics from this hospitalization (including imaging, microbiology, ancillary and laboratory) are listed below for reference.    LAB RESULTS: Basic Metabolic Panel:  Recent Labs Lab 05/31/14 0614 06/01/14 0550  NA 143 141  K 3.9 4.0  CL 105 103  CO2 26 28  GLUCOSE 133* 147*  BUN 19 20  CREATININE 1.10 1.21  CALCIUM 8.7 8.5   Liver Function Tests:  Recent Labs Lab 05/30/14 1659 05/31/14 0614  AST 16 15  ALT 14 13  ALKPHOS 76 67  BILITOT <0.2* 0.2*  PROT 6.2 5.8*  ALBUMIN 3.1* 3.0*   No results for input(s): LIPASE, AMYLASE in the last 168 hours. No results for input(s): AMMONIA in the last 168 hours. CBC:  Recent Labs Lab 05/30/14 1659  06/02/14 0604 06/03/14 0452  WBC 5.0  < > 3.8* 4.4  NEUTROABS 3.4  --   --   --   HGB 9.0*  < > 8.9* 9.3*  HCT 29.8*  < > 27.7* 28.9*  MCV 90.6  < > 89.6 88.7  PLT 290  < > 267 301  < > = values in this interval not displayed. Cardiac Enzymes:  Recent Labs Lab 05/30/14 2059  TROPONINI <0.30   BNP: Invalid input(s): POCBNP CBG:  Recent Labs Lab 06/02/14 2125 06/03/14 0739  GLUCAP 173* 139*    Significant Diagnostic Studies:  No results found.    Disposition and Follow-up:     Discharge Instructions    Diet Carb Modified    Complete by:  As directed      Discharge instructions    Complete by:  As directed    Please HOLD coumadin until you follow-up with Dr Patty Sermons in 2-3 weeks. You need to discuss with Dr Patty Sermons when to restart or continue coumadin. You have been started on Plavix, please follow your labs/hemoglobin, watch for any bleeding in the stools.     Increase activity slowly    Complete by:  As directed             DISPOSITION: home  DIET: Carb modified   DISCHARGE FOLLOW-UP Follow-up Information    Follow  up with [name],[name], MD. Schedule an appointment as soon as possible for a visit in 10 days.   Specialty:  Family Medicine   Why:  for hospital follow-up, obtain labs, CBC   Contact information:   [address] [place] [address] [phone]       Follow up with Cassell Clementhomas Brackbill, MD. Schedule an appointment as soon as possible for a visit in 2 weeks.   Specialty:  Cardiology   Why:  for hospital follow-up   Contact information:   [address] [place] [address] [phone]       Follow up with HAYES,JOHN C, MD. Schedule an appointment as soon as possible for a visit in 3 weeks.   Specialty:  Gastroenterology   Why:  for hospital follow-up   Contact information:   [address] [place] [address] [phone]       Time spent on Discharge: 39 mins  Signed:   Maguire Sime M.D. [name] 06/03/2014, 11:26 AM Pager: [phone]

## 2014-06-03 NOTE — Progress Notes (Signed)
Discharge instructions reviewed with patient and patient's family. Prescriptions given to patient. Follow up appts scheduled.  Leanna BattlesEckelmann, Antionetta Ator Eileen, RN.

## 2014-06-03 NOTE — Plan of Care (Signed)
Problem: Phase II Progression Outcomes Goal: IV changed to normal saline lock Outcome: Completed/Met Date Met:  06/03/14

## 2014-06-27 ENCOUNTER — Encounter: Payer: Self-pay | Admitting: Physician Assistant

## 2014-06-27 ENCOUNTER — Ambulatory Visit (INDEPENDENT_AMBULATORY_CARE_PROVIDER_SITE_OTHER): Payer: Medicare Other | Admitting: Physician Assistant

## 2014-06-27 VITALS — BP 110/78 | HR 79 | Ht 72.0 in

## 2014-06-27 DIAGNOSIS — I480 Paroxysmal atrial fibrillation: Secondary | ICD-10-CM

## 2014-06-27 DIAGNOSIS — I4891 Unspecified atrial fibrillation: Secondary | ICD-10-CM

## 2014-06-27 DIAGNOSIS — R609 Edema, unspecified: Secondary | ICD-10-CM

## 2014-06-27 DIAGNOSIS — K922 Gastrointestinal hemorrhage, unspecified: Secondary | ICD-10-CM

## 2014-06-27 DIAGNOSIS — I10 Essential (primary) hypertension: Secondary | ICD-10-CM

## 2014-06-27 NOTE — Assessment & Plan Note (Signed)
Patient states hemoglobin last week was 9.8. Now off Coumadin. Follow-up with GI.

## 2014-06-27 NOTE — Patient Instructions (Signed)
Your physician recommends that you continue on your current medications as directed. Please refer to the Current Medication list given to you today.    Your physician recommends that you schedule a follow-up appointment in:  [place] IN 2 MONTHS

## 2014-06-27 NOTE — Assessment & Plan Note (Signed)
Stable on Lasix and metolazone.

## 2014-06-27 NOTE — Progress Notes (Signed)
HPI: This is an 80 year old male patient [name] who has paroxysmal atrial fibrillation with cardioversion in January 2015 treated with amiodarone and Coumadin. He had bradycardia on beta blocker and diltiazem and this was stopped. He also has had significant lower extremity edema treated with Lasix and metolazone, hypertensive heart disease, obesity, diabetes mellitus, and dyslipidemia. Last echo showed normal LV function with severe LA enlargement mild to moderate TR and moderate elevated pulmonary pressures.  The patient recently had an admission with a GI bleed and was transfused 2 units of packed RBCs. He was seen by GI and declined endoscopy. He saw his primary care physician and hemoglobin was 7.5 on fingerstick and he was sent back to the emergency room and admitted. Follow-up hemoglobin was 9. He had an EGD on November 5 which revealed a large hiatal hernia but otherwise normal. He was seen in consult by [name] who had a long discussion with the patient and family concerning the risk and benefits of anticoagulation. His chadsvasc score is 5 with an age of greater than 75 years, diabetic, hypertensive, and history of congestive heart failure. He treated him with Plavix 75 mg daily because of recent GI bleed.He has maintained NSR.  Patient is here today in follow up. He denies any chest pain, palpitations, dizziness or presyncope. He has chronic dyspnea on exertion that has not changed. He says he really can't tell when he is in atrial fibrillation but knows that he felt much better after the cardioversion in January.  Allergies  Allergen Reactions  . Aspirin Hives     Current Outpatient Prescriptions  Medication Sig Dispense Refill  . acetaminophen (TYLENOL) 500 MG tablet Take 1,000 mg by mouth every 6 (six) hours as needed. For pain    . amiodarone (PACERONE) 200 MG tablet Take 1 tablet (200 mg total) by mouth daily. 90 tablet 3  . atorvastatin (LIPITOR) 80 MG  tablet Take 0.5 tablets (40 mg total) by mouth every morning. 15 tablet 6  . clopidogrel (PLAVIX) 75 MG tablet Take 1 tablet (75 mg total) by mouth daily. 30 tablet 1  . furosemide (LASIX) 40 MG tablet Take 80 mg by mouth daily.     Marland Kitchen. gemfibrozil (LOPID) 600 MG tablet Take 600 mg by mouth 2 (two) times daily.     Marland Kitchen. lisinopril (PRINIVIL,ZESTRIL) 40 MG tablet Take 20 mg by mouth every morning.     . metFORMIN (GLUCOPHAGE) 500 MG tablet Take 500-1,000 mg by mouth 2 (two) times daily with a meal. 1 tab in the am and 2 tabs in the pm    . metolazone (ZAROXOLYN) 5 MG tablet Take 5 mg by mouth 2 (two) times a week. Takes 5mg  on Mon and Thurs only    . pantoprazole (PROTONIX) 40 MG tablet Take 1 tablet (40 mg total) by mouth daily. 30 tablet 3  . Probiotic Product (ALIGN) 4 MG CAPS Take 4 mg by mouth daily.    Marland Kitchen. tiotropium (SPIRIVA) 18 MCG inhalation capsule Place 18 mcg into inhaler and inhale daily.     No current facility-administered medications for this visit.    Past Medical History  Diagnosis Date  . Paroxysmal atrial fibrillation     cardioversion 01/25/10  . Diabetes mellitus   . Exogenous obesity   . Hypertension   . Microcytic anemia   . Hyperlipidemia   . GERD (gastroesophageal reflux disease)   . Hiatal hernia   . Osteoarthritis  Past Surgical History  Procedure Laterality Date  . Total knee arthroplasty      B  . Cardioversion  01/25/10  . Cardiovascular stress test  2007    No ischemia. EF 61%  . [place] echocardiography  February 2011    Normal EF; LAE and RVE  . Cardioversion N/A 08/19/2013    Procedure: CARDIOVERSION;  Surgeon: Cassell Clementhomas [place], MD;  [facility];  Service: Cardiovascular;  Laterality: N/A;  . Esophagogastroduodenoscopy N/A 06/02/2014    Procedure: ESOPHAGOGASTRODUODENOSCOPY (EGD);  Surgeon: Barrie FolkJohn C Hayes, MD;  [facility];  Service: Endoscopy;  Laterality: N/A;    Family History  Problem Relation Age of Onset  . Cancer Mother      deceased  . Pneumonia Father     deceased  . ALS Brother     deceased  . Cancer Brother     deceased/bone ca  . Cancer Sister     deceased    History   Social History  . Marital Status: Married    Spouse Name: N/A    Number of Children: N/A  . Years of Education: N/A   Occupational History  . Not on file.   Social History Main Topics  . Smoking status: Former Smoker    Quit date: 07/29/1981  . Smokeless tobacco: Not on file  . Alcohol Use: No  . Drug Use: No  . Sexual Activity: Yes   Other Topics Concern  . Not on file   Social History Narrative    ROS: See history of present illness otherwise negative  BP 110/78 mmHg  Pulse 79  Ht 6' (1.829 m)  PHYSICAL EXAM: Obese, in no acute distress. Neck: No JVD, HJR, Bruit, or thyroid enlargement  Lungs:Decreased BS, No tachypnea, clear without wheezing, rales, or rhonchi  Cardiovascular: RRR, PMI not displaced, Normal S1 and S2, 2/6 systolic murmur LSB,no gallops, bruit, thrill, or heave.  Abdomen: BS normal. Soft without organomegaly, masses, lesions or tenderness.  Extremities: without cyanosis, clubbing or edema. Good distal pulses bilateral  SKin: Warm, no lesions or rashes   Musculoskeletal: No deformities  Neuro: no focal signs   Wt Readings from Last 3 Encounters:  06/02/14 252 lb 8 oz (114.533 kg)  05/25/14 252 lb 13.9 oz (114.7 kg)  04/19/14 266 lb (120.657 kg)    Lab Results  Component Value Date   WBC 4.4 06/03/2014   HGB 9.3* 06/03/2014   HCT 28.9* 06/03/2014   PLT 301 06/03/2014   GLUCOSE 147* 06/01/2014   CHOL 154 06/06/2011   TRIG 115.0 06/06/2011   HDL 48.90 06/06/2011   LDLCALC 82 06/06/2011   ALT 13 05/31/2014   AST 15 05/31/2014   NA 141 06/01/2014   K 4.0 06/01/2014   CL 103 06/01/2014   CREATININE 1.21 06/01/2014   BUN 20 06/01/2014   CO2 28 06/01/2014   TSH 1.668 09/12/2013   INR 1.16 05/31/2014   HGBA1C 7.0* 05/26/2014    EKG:NSR with first degree AV block poor R  wave progression, no acute change

## 2014-06-27 NOTE — Assessment & Plan Note (Signed)
Patient had cardioversion in January 2015 and has maintained normal sinus rhythm on amiodarone since then. His Coumadin was stopped because of his recent GI bleed. He was started on Plavix by [name]. Patient's Chadvasc score is 5. I discussed this patient in detail with [name]. The patient has had no further atrial fibrillation so he is okay to leave him on Plavix at this time. Would not use Coumadin with recent GI bleed. If the patient has recurrent atrial fibrillation he may need another agent such as Eliquis. Follow-up with [name] in 2 months.

## 2014-06-27 NOTE — Assessment & Plan Note (Signed)
Blood pressure stable ? ?

## 2014-07-15 ENCOUNTER — Other Ambulatory Visit: Payer: Self-pay | Admitting: *Deleted

## 2014-07-15 MED ORDER — CLOPIDOGREL BISULFATE 75 MG PO TABS: 75.0000 mg | ORAL_TABLET | Freq: Every day | ORAL | Status: DC

## 2014-07-25 ENCOUNTER — Other Ambulatory Visit: Payer: Self-pay | Admitting: *Deleted

## 2014-07-25 MED ORDER — CLOPIDOGREL BISULFATE 75 MG PO TABS: 75.0000 mg | ORAL_TABLET | Freq: Every day | ORAL | Status: DC

## 2014-09-08 ENCOUNTER — Encounter: Payer: Self-pay | Admitting: Cardiology

## 2014-09-08 ENCOUNTER — Ambulatory Visit (INDEPENDENT_AMBULATORY_CARE_PROVIDER_SITE_OTHER): Payer: Medicare Other | Admitting: Cardiology

## 2014-09-08 VITALS — BP 130/68 | HR 65 | Ht 71.0 in | Wt 252.0 lb

## 2014-09-08 DIAGNOSIS — K922 Gastrointestinal hemorrhage, unspecified: Secondary | ICD-10-CM

## 2014-09-08 DIAGNOSIS — R609 Edema, unspecified: Secondary | ICD-10-CM

## 2014-09-08 DIAGNOSIS — I10 Essential (primary) hypertension: Secondary | ICD-10-CM

## 2014-09-08 DIAGNOSIS — I480 Paroxysmal atrial fibrillation: Secondary | ICD-10-CM

## 2014-09-08 NOTE — Patient Instructions (Signed)
STOP ZAROXOLYN   Your physician wants you to follow-up in: 6 month ov/ekg  You will receive a reminder letter in the mail two months in advance. If you don't receive a letter, please call our office to schedule the follow-up appointment.

## 2014-09-08 NOTE — Progress Notes (Signed)
Cardiology Office Note   Date:  09/08/2014   ID:  Omar Chan, DOB 09-07-1925, MRN [id number]  PCP:  Mickie Hillier, MD  Cardiologist:   Cassell Clement, MD   No chief complaint on file.     History of Present Illness: Omar Chan is a 81 y.o. male who presents for scheduled follow-up office visit.   The patient has a history of previous atrial fibrillation.  Has hypertensive heart disease, DM, obesity, HLD, GERD, OA, hiatal hernia and PAF. Has had past spell in December of 2014 where he had a spell where he had memory problems, difficulty with his gait and concern that he may have had a TIA. He is limited by chronic dyspnea/arthritis and obesity. Remains quite sedentary. Has had past cardioversion on 08/23/13. The patient was hospitalized on 09/12/13 until 09/14/13 for marked bradycardia. He was admitted initially thinking he might need a pacemaker. However when his beta blocker and diltiazem was held, his heart rate improved and he did not require a pacemaker. His only antiarrhythmic now is low dose amiodarone.He is no longer on Coumadin.  He is on Plavix.  He is not on aspirin.  Does have chronic GI bleeding.  Source of the bleeding is unknown. Since last visit he's been doing well except for his significant peripheral edema. He states that the edema does not go down much at night. He is trying to elevate his legs at least twice a day.  He does not notice any improvement with Zaroxolyn.  We are stopping Zaroxolyn. Past Medical History  Diagnosis Date  . Paroxysmal atrial fibrillation     cardioversion 01/25/10  . Diabetes mellitus   . Exogenous obesity   . Hypertension   . Microcytic anemia   . Hyperlipidemia   . GERD (gastroesophageal reflux disease)   . Hiatal hernia   . Osteoarthritis     Past Surgical History  Procedure Laterality Date  . Total knee arthroplasty      B  . Cardioversion  01/25/10  . Cardiovascular stress test  2007    No ischemia. EF 61%    . US echocardiography  February 2011    Normal EF; LAE and RVE  . Cardioversion N/A 08/19/2013    Procedure: CARDIOVERSION;  Surgeon: Cassell Clement, MD;  Location: [facility] ENDOSCOPY;  Service: Cardiovascular;  Laterality: N/A;  . Esophagogastroduodenoscopy N/A 06/02/2014    Procedure: ESOPHAGOGASTRODUODENOSCOPY (EGD);  Surgeon: Barrie Folk, MD;  Location[facility] END[facility]COPY;  Service: Endoscopy;  Laterality: N/A;     Current Outpatient Prescriptions  Medication Sig Dispense Refill  . acetaminophen (TYLENOL) 500 MG tablet Take 1,000 mg by mouth every 6 (six) hours as needed. For pain    . amiodarone (PACERONE) 200 MG tablet Take 1 tablet (200 mg total) by mouth daily. 90 tablet 3  . atorvastatin (LIPITOR) 80 MG tablet Take 0.5 tablets (40 mg total) by mouth every morning. 15 tablet 6  . clopidogrel (PLAVIX) 75 MG tablet Take 1 tablet (75 mg total) by mouth daily. 90 tablet 1  . Ferrous Sulfate (IRON) 325 (65 FE) MG TABS Take 325 mg by mouth 2 (two) times daily. Take one tablet in the morning and one tablet at night with a meal.    . furosemide (LASIX) 40 MG tablet Take 80 mg by mouth daily.     Marland Kitchen gemfibrozil (LOPID) 600 MG tablet Take 600 mg by mouth 2 (two) times daily.     Marland Kitchen lisinopril (PRINIVIL,ZESTRIL) 40 MG tablet Take  20 mg by mouth every morning.     . metFORMIN (GLUCOPHAGE) 500 MG tablet Take 500-1,000 mg by mouth 2 (two) times daily with a meal. 1 tab in the am and 2 tabs in the pm    . Multiple Vitamins-Minerals (MULTIVITAMIN WITH MINERALS) tablet Take 1 tablet by mouth daily.    . pantoprazole (PROTONIX) 40 MG tablet Take 1 tablet (40 mg total) by mouth daily. 30 tablet 3  . Probiotic Product (ALIGN) 4 MG CAPS Take 4 mg by mouth daily.    Marland Kitchen tiotropium (SPIRIVA) 18 MCG inhalation capsule Place 18 mcg into inhaler and inhale daily.     No current facility-administered medications for this visit.    Allergies:   Aspirin    Social History:  The patient  reports that he quit smoking  about 33 years ago. He does not have any smokeless tobacco history on file. He reports that he does not drink alcohol or use illicit drugs.   Family History:  The patient's family history includes ALS in his brother; Cancer in his brother, mother, and sister; Pneumonia in his father.    ROS:  Please see the history of present illness.   Otherwise, review of systems are positive for none.   All other systems are reviewed and negative.    PHYSICAL EXAM: VS:  BP 130/68 mmHg  Pulse 65  Ht  (1.803 m)  Wt 252 lb (114.306 kg)  BMI 35.16 kg/m2 , BMI Body mass index is 35.16 kg/(m^2). GEN: Well nourished, well developed, in no acute distress HEENT: normal Neck: no JVD, carotid bruits, or masses Cardiac: RRR; no murmurs, rubs, or gallops, moderate edema bilaterally Respiratory:  clear to auscultation bilaterally, normal work of breathing GI: soft, nontender, nondistended, + BS MS: no deformity or atrophy Skin: warm and dry, no rash Neuro:  Strength and sensation are intact Psych: euthymic mood, full affect   EKG:  EKG is not ordered today.    Recent Labs: 09/12/2013: TSH 1.668 05/31/2014: ALT 13; Pro B Natriuretic peptide (BNP) 597.3* 06/01/2014: BUN 20; Creatinine 1.21; Potassium 4.0; Sodium 141 06/03/2014: Hemoglobin 9.3*; Platelets 301    Lipid Panel    Component Value Date/Time   CHOL 154 06/06/2011 0852   TRIG 115.0 06/06/2011 0852   HDL 48.90 06/06/2011 0852   CHOLHDL 3 06/06/2011 0852   VLDL 23.0 06/06/2011 0852   LDLCALC 82 06/06/2011 0852      Wt Readings from Last 3 Encounters:  09/08/14 252 lb (114.306 kg)  06/02/14 252 lb 8 oz (114.533 kg)  05/25/14 252 lb 13.9 oz (114.7 kg)         ASSESSMENT AND PLAN:  1. Paroxysmal atrial fibrillation, currently maintaining normal sinus rhythm on low-dose amiodarone. 2. hypertensive heart disease. 3. Obesity 4. diabetes mellitus 5. Dyslipidemia 6. significant lower extremity edema 7.  Anemia, followed by Dr.  Carolin Guernsey   Current medicines are reviewed at length with the patient today.  The patient does not have concerns regarding medicines.  The following changes have been made:  no change  Labs/ tests ordered today include: None  No orders of the defined types were placed in this encounter.   The patient was encouraged in his weight loss.  He has lost 14 pounds since we last saw him.  He will continue current medication except we're stopping Zaroxolyn at his request.  He does not think that it is helping him  Disposition:   FU with Dr. Patty Sermons in 6  months for office visit and EKG   Signed, Cassell Clementhomas Patricia Fargo, MD  09/08/2014 6:18 PM    [facility] [address], [place], [place]  [address] Phone: [phone]) [phone]; Fax: [phone]) [phone]

## 2014-09-09 ENCOUNTER — Ambulatory Visit: Payer: Medicare Other | Admitting: Cardiology

## 2014-10-11 ENCOUNTER — Ambulatory Visit (INDEPENDENT_AMBULATORY_CARE_PROVIDER_SITE_OTHER): Payer: Medicare Other | Admitting: Ophthalmology

## 2014-10-11 DIAGNOSIS — E11319 Type 2 diabetes mellitus with unspecified diabetic retinopathy without macular edema: Secondary | ICD-10-CM | POA: Diagnosis not present

## 2014-10-11 DIAGNOSIS — H3531 Nonexudative age-related macular degeneration: Secondary | ICD-10-CM

## 2014-10-11 DIAGNOSIS — I10 Essential (primary) hypertension: Secondary | ICD-10-CM | POA: Diagnosis not present

## 2014-10-11 DIAGNOSIS — E11329 Type 2 diabetes mellitus with mild nonproliferative diabetic retinopathy without macular edema: Secondary | ICD-10-CM | POA: Diagnosis not present

## 2014-10-11 DIAGNOSIS — H35033 Hypertensive retinopathy, bilateral: Secondary | ICD-10-CM

## 2014-10-11 DIAGNOSIS — H43813 Vitreous degeneration, bilateral: Secondary | ICD-10-CM

## 2014-11-05 ENCOUNTER — Other Ambulatory Visit: Payer: Self-pay | Admitting: Cardiology

## 2015-01-06 ENCOUNTER — Other Ambulatory Visit: Payer: Self-pay | Admitting: Cardiology

## 2015-02-03 ENCOUNTER — Other Ambulatory Visit: Payer: Self-pay | Admitting: Cardiology

## 2015-02-27 IMAGING — CT CT HEAD W/O CM
2 series · 16 of 30 positions shown, 18 images · non-contrast
Comparison: CT scan of July 26, 2013.

CLINICAL DATA: Dizziness, headache.

EXAM:
CT HEAD WITHOUT CONTRAST
TECHNIQUE: Contiguous axial images were obtained from the base of the skull
through the vertex without intravenous contrast.

[Series 2: head w/o · axial · non-contrast · 0.49mm/px · z∈[+80,+210]mm · 8 of 34 slices shown, 10 images]
[im 4/34  brain]
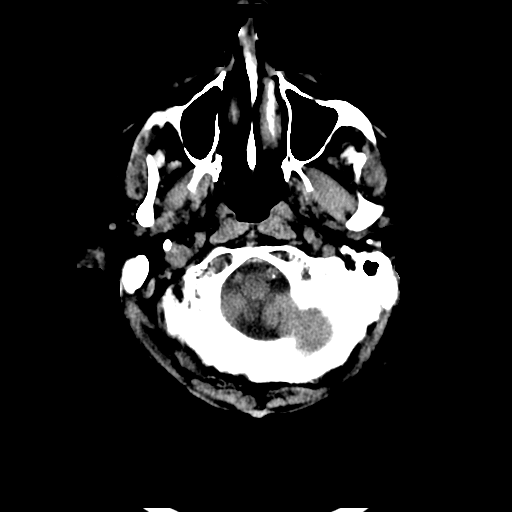
[im 4/34  bone]
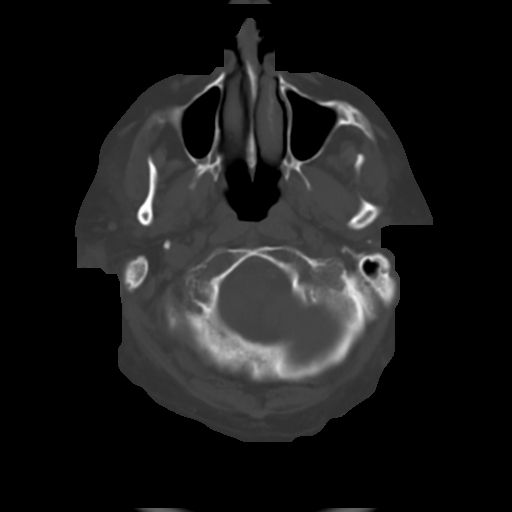
[im 8/34  brain]
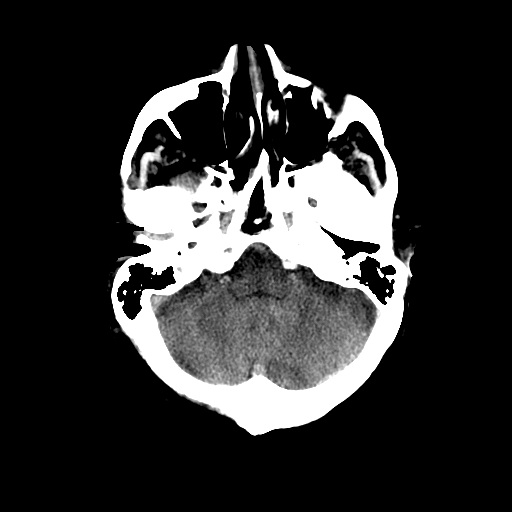
[im 12/34  brain]
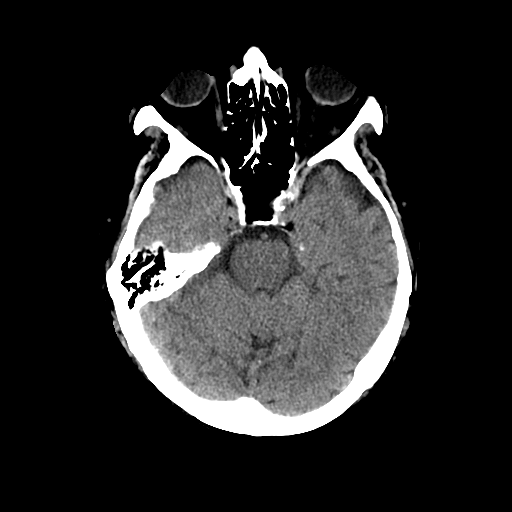
[im 15/34  brain]
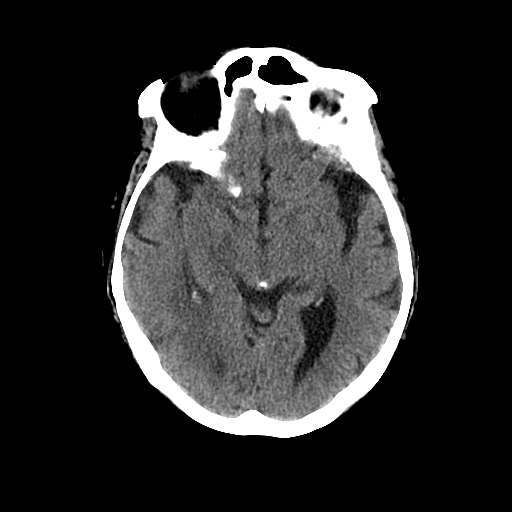
[im 19/34  brain]
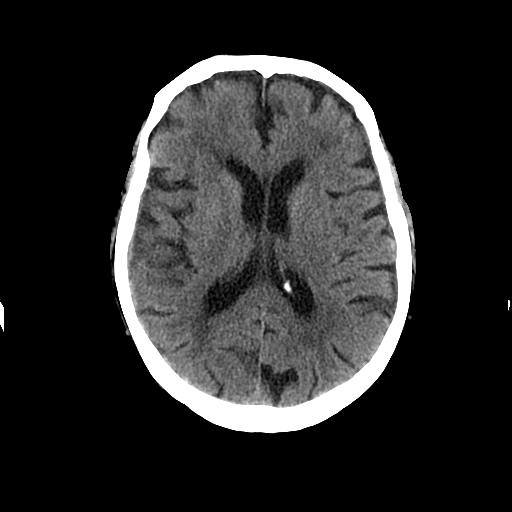
[im 19/34  bone]
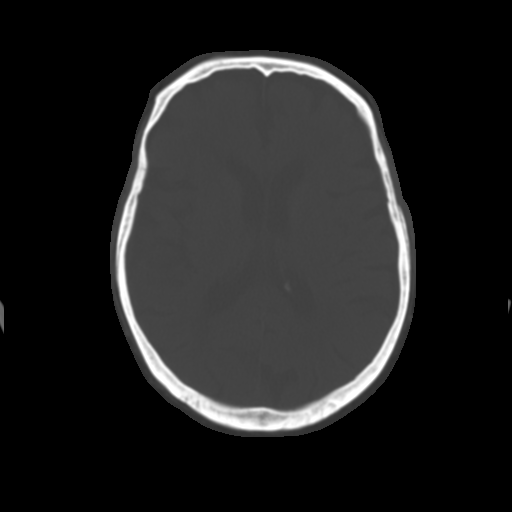
[im 23/34  brain]
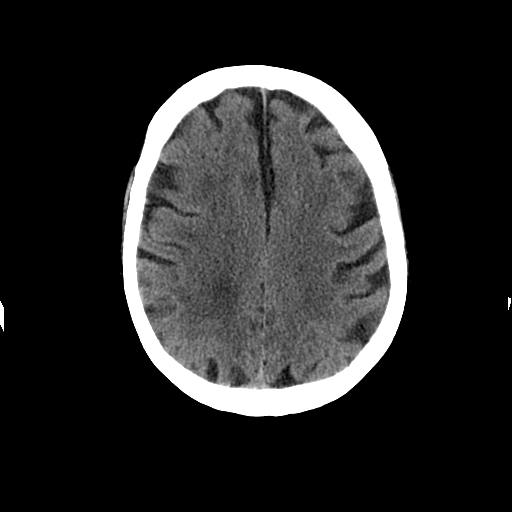
[im 26/34  brain]
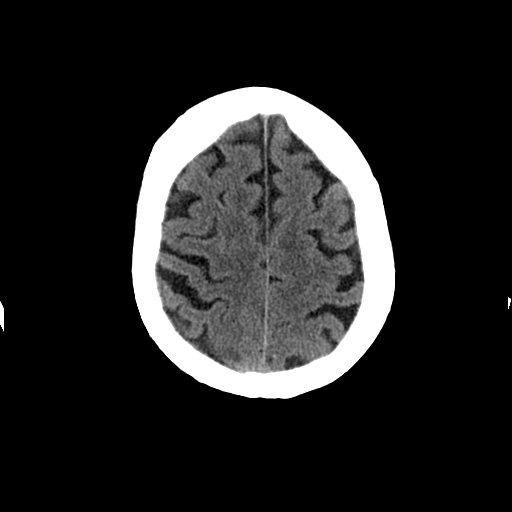
[im 30/34  brain]
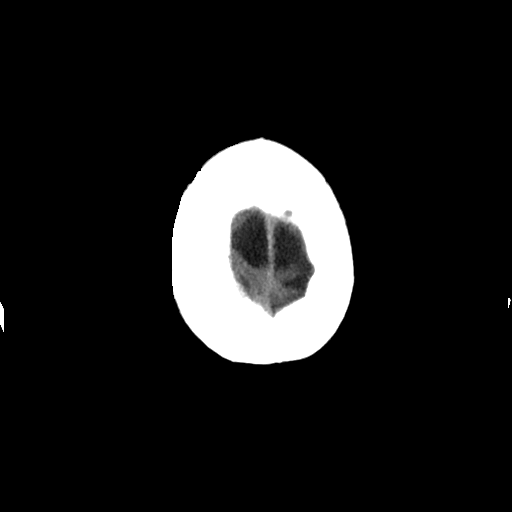

[Series 3: head w/o bone · axial · non-contrast · 0.49mm/px · z∈[+80,+213]mm · 8 of 67 slices shown]
[im 7/67  bone]
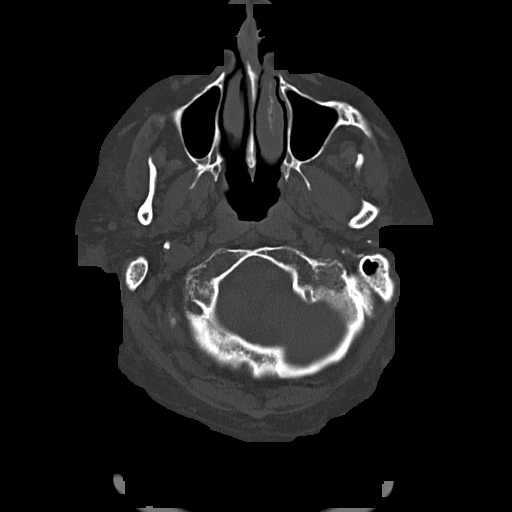
[im 14/67  bone]
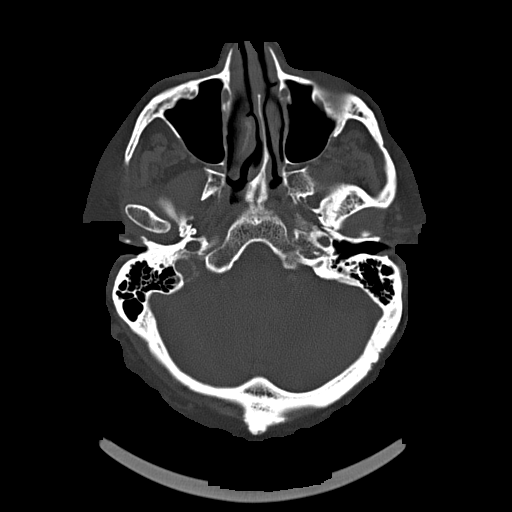
[im 21/67  bone]
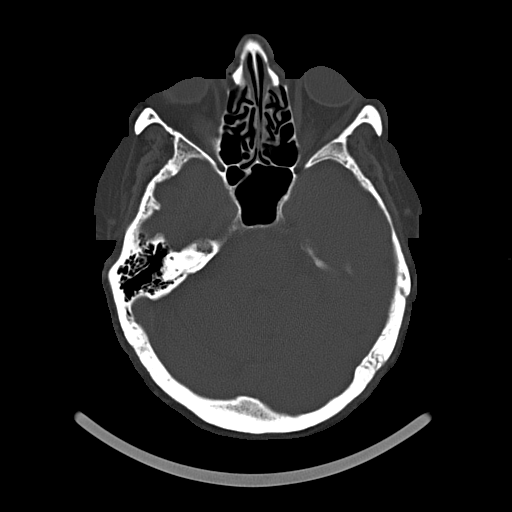
[im 28/67  bone]
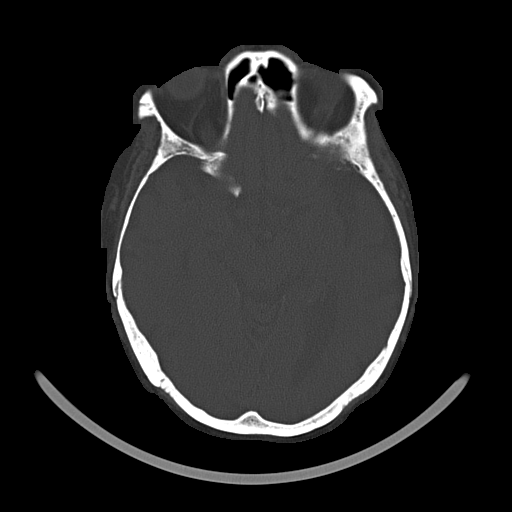
[im 39/67  bone]
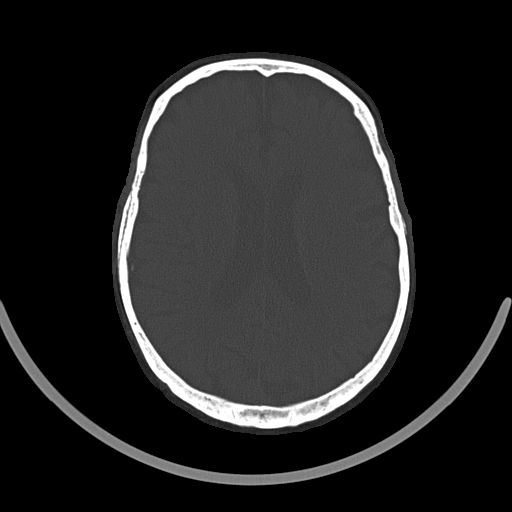
[im 46/67  bone]
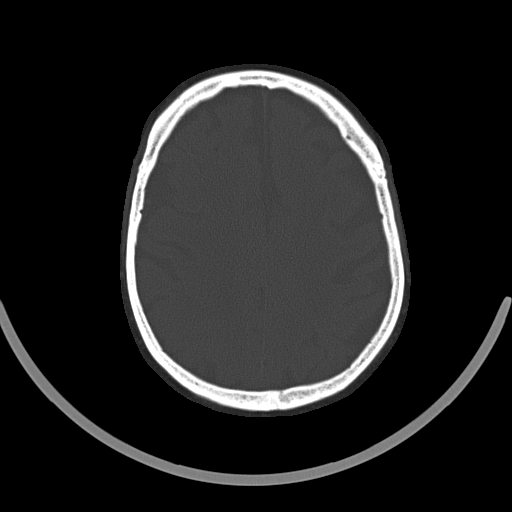
[im 53/67  bone]
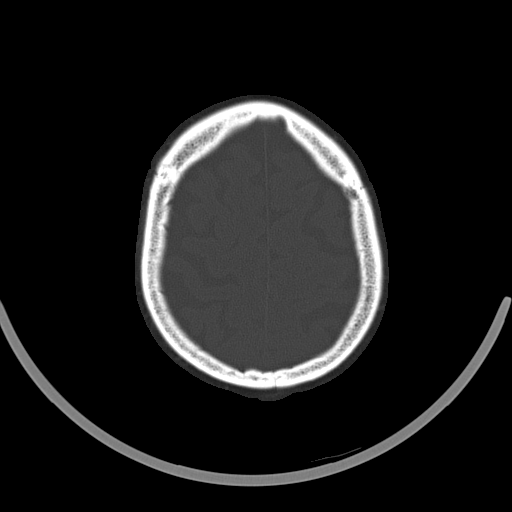
[im 60/67  bone]
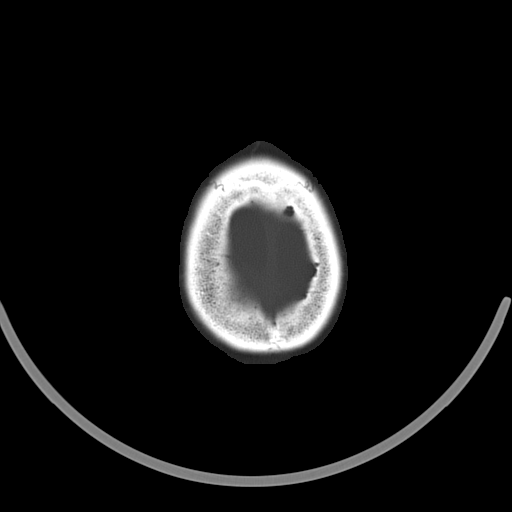

[16 of 30 positions shown; findings below may reference images not displayed]

FINDINGS: Bony calvarium appears intact. Mild diffuse cortical atrophy is
noted. Mild chronic ischemic white matter disease is noted. No mass
effect or midline shift is noted. Ventricular size is within normal
limits. There is no evidence of mass lesion, hemorrhage or acute
infarction.
IMPRESSION: Mild diffuse cortical atrophy. Mild chronic ischemic white matter
disease. No acute intracranial abnormality seen.

## 2015-03-08 ENCOUNTER — Encounter: Payer: Self-pay | Admitting: Physician Assistant

## 2015-03-08 ENCOUNTER — Ambulatory Visit (INDEPENDENT_AMBULATORY_CARE_PROVIDER_SITE_OTHER): Payer: Medicare Other | Admitting: Physician Assistant

## 2015-03-08 VITALS — BP 150/80 | HR 67 | Ht 71.0 in | Wt 251.0 lb

## 2015-03-08 DIAGNOSIS — R609 Edema, unspecified: Secondary | ICD-10-CM | POA: Diagnosis not present

## 2015-03-08 DIAGNOSIS — I480 Paroxysmal atrial fibrillation: Secondary | ICD-10-CM

## 2015-03-08 DIAGNOSIS — I10 Essential (primary) hypertension: Secondary | ICD-10-CM

## 2015-03-08 DIAGNOSIS — R0989 Other specified symptoms and signs involving the circulatory and respiratory systems: Secondary | ICD-10-CM

## 2015-03-08 DIAGNOSIS — E785 Hyperlipidemia, unspecified: Secondary | ICD-10-CM | POA: Diagnosis not present

## 2015-03-08 NOTE — Assessment & Plan Note (Signed)
On Lipitor. Managed by Dr. [name].

## 2015-03-08 NOTE — Patient Instructions (Addendum)
Medication Instructions:  Your physician recommends that you continue on your current medications as directed. Please refer to the Current Medication list given to you today.     Labwork: None ordered  Testing/Procedures: Your physician has requested that you have a carotid duplex. This test is an ultrasound of the carotid arteries in your neck. It looks at blood flow through these arteries that supply the brain with blood. Allow one hour for this exam. There are no restrictions or special instructions.   Follow-Up: Your physician wants you to follow-up in: 6 months with Dr Rodney Langton will receive a reminder letter in the mail two months in advance. If you don't receive a letter, please call our office to schedule the follow-up appointment.   Any Other Special Instructions Will Be Listed Below (If Applicable).  Low-Sodium Eating Plan Sodium raises blood pressure and causes water to be held in the body. Getting less sodium from food will help lower your blood pressure, reduce any swelling, and protect your heart, liver, and kidneys. We get sodium by adding salt (sodium chloride) to food. Most of our sodium comes from canned, boxed, and frozen foods. Restaurant foods, fast foods, and pizza are also very high in sodium. Even if you take medicine to lower your blood pressure or to reduce fluid in your body, getting less sodium from your food is important. WHAT IS MY PLAN? Most people should limit their sodium intake to 2,300 mg a day. Your health care provider recommends that you limit your sodium intake to 2 grams a day.  WHAT DO I NEED TO KNOW ABOUT THIS EATING PLAN? For the low-sodium eating plan, you will follow these general guidelines:  Choose foods with a % Daily Value for sodium of less than 5% (as listed on the food label).   Use salt-free seasonings or herbs instead of table salt or sea salt.   Check with your health care provider or pharmacist before using salt substitutes.    Eat fresh foods.  Eat more vegetables and fruits.  Limit canned vegetables. If you do use them, rinse them well to decrease the sodium.   Limit cheese to 1 oz (28 g) per day.   Eat lower-sodium products, often labeled as "lower sodium" or "no salt added."  Avoid foods that contain monosodium glutamate (MSG). MSG is sometimes added to [place] food and some canned foods.  Check food labels (Nutrition Facts labels) on foods to learn how much sodium is in one serving.  Eat more home-cooked food and less restaurant, buffet, and fast food.  When eating at a restaurant, ask that your food be prepared with less salt or none, if possible.  HOW DO I READ FOOD LABELS FOR SODIUM INFORMATION? The Nutrition Facts label lists the amount of sodium in one serving of the food. If you eat more than one serving, you must multiply the listed amount of sodium by the number of servings. Food labels may also identify foods as:  Sodium free--Less than 5 mg in a serving.  Very low sodium--35 mg or less in a serving.  Low sodium--140 mg or less in a serving.  Light in sodium--50% less sodium in a serving. For example, if a food that usually has 300 mg of sodium is changed to become light in sodium, it will have 150 mg of sodium.  Reduced sodium--25% less sodium in a serving. For example, if a food that usually has 400 mg of sodium is changed to reduced sodium, it will  have 300 mg of sodium. WHAT FOODS CAN I EAT? Grains Low-sodium cereals, including oats, puffed wheat and rice, and shredded wheat cereals. Low-sodium crackers. Unsalted rice and pasta. Lower-sodium bread.  Vegetables Frozen or fresh vegetables. Low-sodium or reduced-sodium canned vegetables. Low-sodium or reduced-sodium tomato sauce and paste. Low-sodium or reduced-sodium tomato and vegetable juices.  Fruits Fresh, frozen, and canned fruit. Fruit juice.  Meat and Other Protein Products Low-sodium canned tuna and salmon. Fresh  or frozen meat, poultry, seafood, and fish. Lamb. Unsalted nuts. Dried beans, peas, and lentils without added salt. Unsalted canned beans. Homemade soups without salt. Eggs.  Dairy Milk. Soy milk. Ricotta cheese. Low-sodium or reduced-sodium cheeses. Yogurt.  Condiments Fresh and dried herbs and spices. Salt-free seasonings. Onion and garlic powders. Low-sodium varieties of mustard and ketchup. Lemon juice.  Fats and Oils Reduced-sodium salad dressings. Unsalted butter.  Other Unsalted popcorn and pretzels.  The items listed above may not be a complete list of recommended foods or beverages. Contact your dietitian for more options. WHAT FOODS ARE NOT RECOMMENDED? Grains Instant hot cereals. Bread stuffing, pancake, and biscuit mixes. Croutons. Seasoned rice or pasta mixes. Noodle soup cups. Boxed or frozen macaroni and cheese. Self-rising flour. Regular salted crackers. Vegetables Regular canned vegetables. Regular canned tomato sauce and paste. Regular tomato and vegetable juices. Frozen vegetables in sauces. Salted french fries. Olives. Rosita Fire. Relishes. Sauerkraut. Salsa. Meat and Other Protein Products Salted, canned, smoked, spiced, or pickled meats, seafood, or fish. Bacon, ham, sausage, hot dogs, corned beef, chipped beef, and packaged luncheon meats. Salt pork. Jerky. Pickled herring. Anchovies, regular canned tuna, and sardines. Salted nuts. Dairy Processed cheese and cheese spreads. Cheese curds. Blue cheese and cottage cheese. Buttermilk.  Condiments Onion and garlic salt, seasoned salt, table salt, and sea salt. Canned and packaged gravies. Worcestershire sauce. Tartar sauce. Barbecue sauce. Teriyaki sauce. Soy sauce, including reduced sodium. Steak sauce. Fish sauce. Oyster sauce. Cocktail sauce. Horseradish. Regular ketchup and mustard. Meat flavorings and tenderizers. Bouillon cubes. Hot sauce. Tabasco sauce. Marinades. Taco seasonings. Relishes. Fats and  Oils Regular salad dressings. Salted butter. Margarine. Ghee. Bacon fat.  Other Potato and tortilla chips. Corn chips and puffs. Salted popcorn and pretzels. Canned or dried soups. Pizza. Frozen entrees and pot pies.  The items listed above may not be a complete list of foods and beverages to avoid. Contact your dietitian for more information. Document Released: 01/04/2002 Document Revised: 07/20/2013 Document Reviewed: 05/19/2013 [facility] Patient Information 2015 [place], [place]. This information is not intended to replace advice given to you by your health care provider. Make sure you discuss any questions you have with your health care provider.

## 2015-03-08 NOTE — Progress Notes (Signed)
Cardiology Office Note   Date:  03/08/2015   ID:  Omar Chan, DOB 1926-06-13, MRN [id number]  PCP:  Mickie Hillier, MD  Cardiologist: Dr. Patty Sermons  Chief Complaint: edema    History of Present Illness: Omar Chan is a 81 y.o. male who presents for  follow-up. He has a history of PAF, marked bradycardia in 2015 but stabilized after stopping beta blocker and diltiazem. He did not require pacemaker. He is on low dose amiodarone and on Plavix. He is not on aspirin because he has chronic GI bleeding. He also has hypertensive heart disease, HLD, DM, obesity, and GERD. His chronic lower extremity edema and Zaroxolyn was stopped last office visit because it was not helping.  The patient continues to have lower extremity edema. He can't wear TED hose because he can't get them on. He loves salt. He put salt on everything including cantaloupe. He eats pickles. He denies chest pain, dizziness or presyncope. He has chronic dyspnea on exertion. He had blood work yesterday and sees Dr. [name] on Friday. He denies any palpitations and doesn't know when he is in atrial fibrillation. He is in normal sinus rhythm today.      Past Medical History  Diagnosis Date  . Paroxysmal atrial fibrillation     cardioversion 01/25/10  . Diabetes mellitus   . Exogenous obesity   . Hypertension   . Microcytic anemia   . Hyperlipidemia   . GERD (gastroesophageal reflux disease)   . Hiatal hernia   . Osteoarthritis     Past Surgical History  Procedure Laterality Date  . Total knee arthroplasty      B  . Cardioversion  01/25/10  . Cardiovascular stress test  2007    No ischemia. EF 61%  . US echocardiography  February 2011    Normal EF; LAE and RVE  . Cardioversion N/A 08/19/2013    Procedure: CARDIOVERSION;  Surgeon: Cassell Clement, MD;  Location: [facility] ENDOSCOPY;  Service: Cardiovascular;  Laterality: N/A;  . Esophagogastroduodenoscopy N/A 06/02/2014    Procedure: ESOPHAGOGASTRODUODENOSCOPY (EGD);   Surgeon: Barrie Folk, MD;  Location: [facility] ENDOSCOPY;  Service: Endoscopy;  Laterality: N/A;     Current Outpatient Prescriptions  Medication Sig Dispense Refill  . acetaminophen (TYLENOL) 500 MG tablet Take 1,000 mg by mouth every 6 (six) hours as needed. For pain    . amiodarone (PACERONE) 200 MG tablet TAKE 1 TABLET DAILY 90 tablet 0  . atorvastatin (LIPITOR) 80 MG tablet Take 0.5 tablets (40 mg total) by mouth every morning. 15 tablet 6  . clopidogrel (PLAVIX) 75 MG tablet TAKE 1 TABLET DAILY 90 tablet 0  . Ferrous Sulfate (IRON) 325 (65 FE) MG TABS Take 325 mg by mouth 2 (two) times daily. Take one tablet in the morning and one tablet at night with a meal.    . furosemide (LASIX) 40 MG tablet Take 80 mg by mouth daily.     Marland Kitchen gemfibrozil (LOPID) 600 MG tablet Take 600 mg by mouth 2 (two) times daily.     Marland Kitchen lisinopril (PRINIVIL,ZESTRIL) 40 MG tablet Take 20 mg by mouth every morning.     . metFORMIN (GLUCOPHAGE) 500 MG tablet Take 500-1,000 mg by mouth 2 (two) times daily with a meal. 1 tab in the am and 2 tabs in the pm    . Multiple Vitamins-Minerals (MULTIVITAMIN WITH MINERALS) tablet Take 1 tablet by mouth daily.    . pantoprazole (PROTONIX) 40 MG tablet Take 1 tablet (40 mg total)  by mouth daily. 30 tablet 3  . Probiotic Product (ALIGN) 4 MG CAPS Take 4 mg by mouth daily.    Marland Kitchen tiotropium (SPIRIVA) 18 MCG inhalation capsule Place 18 mcg into inhaler and inhale daily.     No current facility-administered medications for this visit.    Allergies:   Aspirin    Social History:  The patient  reports that he quit smoking about 33 years ago. He does not have any smokeless tobacco history on file. He reports that he does not drink alcohol or use illicit drugs.   Family History:  The patient's   family history includes ALS in his brother; Cancer in his brother, mother, and sister; Hypertension in his mother; Pneumonia in his father. There is no history of Heart attack or Stroke.    ROS:   Please see the history of present illness.   Otherwise, review of systems are positive for none.   All other systems are reviewed and negative.    PHYSICAL EXAM: VS:  BP 150/80 mmHg  Pulse 67  Ht 5\' 11"  (1.803 m)  Wt 251 lb (113.853 kg)  BMI 35.02 kg/m2 , BMI Body mass index is 35.02 kg/(m^2). GEN: Well nourished, well developed, in no acute distress Neck: Bilateral carotid bruits greater than right no JVD, HJR,  or masses Cardiac:  RRR; 2/6 systolic murmur at the left sternal border, no gallop, rubs, thrill or heave,  Respiratory:  clear to auscultation bilaterally, normal work of breathing GI: soft, nontender, nondistended, + BS MS: no deformity or atrophy Extremities: +2-3 edema up to his knees otherwise lower extremities without cyanosis, clubbing, edema, good distal pulses bilaterally.  Skin: warm and dry, no rash Neuro:  Strength and sensation are intact    EKG:  EKG is ordered today. The ekg ordered today demonstrates normal sinus rhythm with first-degree AV block poor R wave progression anteriorly   Recent Labs: 05/31/2014: ALT 13; Pro B Natriuretic peptide (BNP) 597.3* 06/01/2014: BUN 20; Creatinine, Ser 1.21; Potassium 4.0; Sodium 141 06/03/2014: Hemoglobin 9.3*; Platelets 301    Lipid Panel    Component Value Date/Time   CHOL 154 06/06/2011 0852   TRIG 115.0 06/06/2011 0852   HDL 48.90 06/06/2011 0852   CHOLHDL 3 06/06/2011 0852   VLDL 23.0 06/06/2011 0852   LDLCALC 82 06/06/2011 0852      Wt Readings from Last 3 Encounters:  03/08/15 251 lb (113.853 kg)  09/08/14 252 lb (114.306 kg)  06/02/14 252 lb 8 oz (114.533 kg)      Other studies Reviewed: Additional studies/ records that were reviewed today include and review of the records demonstrates:  Carotid Dopplers 2014 IMPRESSION: 1. Mild heterogeneous atherosclerotic plaque resulting in less than 50% diameter stenosis in the right internal carotid artery. 2. No significant plaque or stenosis in the left  internal carotid artery. 3. Irregular rhythm noted in both carotid arteries. Query clinical history of atrial fibrillation or other dysrhythmia? Signed,   Sterling Big, MD   [facility]   [facility]     Electronically Signed   By: Malachy Moan M.D.   On: 07/26/2013 12:44   2-D echo 2015 Study Conclusions  - Left ventricle: The cavity size was normal. There was mild   concentric hypertrophy. Systolic function was normal. The   estimated ejection fraction was in the range of 55% to   60%. Wall motion was normal; there were no regional wall   motion abnormalities. Atrial fibrillation with  rapid   ventricular response precludes evaluation of LV diastolic   function. - Left atrium: The atrium was severely dilated. - Right atrium: The atrium was mildly dilated. - Tricuspid valve: Mild-moderate regurgitation directed   centrally. - Pulmonary arteries: Systolic pressure was moderately   increased. PA peak pressure: 58mm Hg (S).   ASSESSMENT AND PLAN:  Edema Patient has chronic lower extremity edema. He eats a lot of salt. His blood pressure is also up a little bit today. I had along discussion concerning a low sodium diet. Hopefully he will cut back. I will not change any of his blood pressure medications today. He sees his primary care on Friday. He says his blood pressure is usually lower at home. Follow-up with Dr. Patty Sermons in 6 months.  Bilateral carotid bruits Patient's last Dopplers were in 2014. We'll repeat.  Dyslipidemia On Lipitor. Managed by Dr. [name].  Paroxysmal a-fib In normal sinus rhythm today. Rate stable.  Hypertension Pressure is up a little today. Patient says it's usually stable at home. 2 g sodium diet. Follow-up with Dr. [name] on Friday.    Elson Clan, PA-C  03/08/2015 10:29 AM    [facility] [address] [place], [place], [place]  [address] Phone: [phone]; Fax: [phone]

## 2015-03-08 NOTE — Assessment & Plan Note (Signed)
In normal sinus rhythm today. Rate stable.

## 2015-03-08 NOTE — Assessment & Plan Note (Signed)
Patient's last Dopplers were in 2014. We'll repeat.

## 2015-03-08 NOTE — Assessment & Plan Note (Signed)
Patient has chronic lower extremity edema. He eats a lot of salt. His blood pressure is also up a little bit today. I had along discussion concerning a low sodium diet. Hopefully he will cut back. I will not change any of his blood pressure medications today. He sees his primary care on Friday. He says his blood pressure is usually lower at home. Follow-up with Dr. Patty Sermons in 6 months.

## 2015-03-08 NOTE — Assessment & Plan Note (Signed)
Pressure is up a little today. Patient says it's usually stable at home. 2 g sodium diet. Follow-up with Dr. [name] on Friday.

## 2015-03-15 ENCOUNTER — Ambulatory Visit (HOSPITAL_COMMUNITY)
Admission: RE | Admit: 2015-03-15 | Discharge: 2015-03-15 | Disposition: A | Discharge status: Home / Self Care | Payer: Medicare Other | Source: Ambulatory Visit | Attending: Physician Assistant | Admitting: Physician Assistant

## 2015-03-15 DIAGNOSIS — R0989 Other specified symptoms and signs involving the circulatory and respiratory systems: Secondary | ICD-10-CM | POA: Diagnosis not present

## 2015-03-15 DIAGNOSIS — I6523 Occlusion and stenosis of bilateral carotid arteries: Secondary | ICD-10-CM | POA: Insufficient documentation

## 2015-04-05 ENCOUNTER — Other Ambulatory Visit: Payer: Self-pay | Admitting: Cardiology

## 2015-04-25 ENCOUNTER — Encounter (HOSPITAL_COMMUNITY): Payer: Self-pay

## 2015-04-25 ENCOUNTER — Emergency Department (HOSPITAL_COMMUNITY)
Admission: EM | Admit: 2015-04-25 | Discharge: 2015-04-26 | Disposition: A | Discharge status: Home / Self Care | Payer: Medicare Other | Attending: Emergency Medicine | Admitting: Emergency Medicine

## 2015-04-25 DIAGNOSIS — Z7902 Long term (current) use of antithrombotics/antiplatelets: Secondary | ICD-10-CM | POA: Insufficient documentation

## 2015-04-25 DIAGNOSIS — E119 Type 2 diabetes mellitus without complications: Secondary | ICD-10-CM | POA: Diagnosis not present

## 2015-04-25 DIAGNOSIS — M62830 Muscle spasm of back: Secondary | ICD-10-CM | POA: Insufficient documentation

## 2015-04-25 DIAGNOSIS — Z87891 Personal history of nicotine dependence: Secondary | ICD-10-CM | POA: Insufficient documentation

## 2015-04-25 DIAGNOSIS — E669 Obesity, unspecified: Secondary | ICD-10-CM | POA: Diagnosis not present

## 2015-04-25 DIAGNOSIS — Z79899 Other long term (current) drug therapy: Secondary | ICD-10-CM | POA: Diagnosis not present

## 2015-04-25 DIAGNOSIS — Z862 Personal history of diseases of the blood and blood-forming organs and certain disorders involving the immune mechanism: Secondary | ICD-10-CM | POA: Insufficient documentation

## 2015-04-25 DIAGNOSIS — E785 Hyperlipidemia, unspecified: Secondary | ICD-10-CM | POA: Diagnosis not present

## 2015-04-25 DIAGNOSIS — K219 Gastro-esophageal reflux disease without esophagitis: Secondary | ICD-10-CM | POA: Diagnosis not present

## 2015-04-25 DIAGNOSIS — I10 Essential (primary) hypertension: Secondary | ICD-10-CM | POA: Insufficient documentation

## 2015-04-25 DIAGNOSIS — M549 Dorsalgia, unspecified: Secondary | ICD-10-CM | POA: Diagnosis present

## 2015-04-25 DIAGNOSIS — M1990 Unspecified osteoarthritis, unspecified site: Secondary | ICD-10-CM | POA: Diagnosis not present

## 2015-04-25 NOTE — ED Notes (Signed)
Pt complains of low back pain for one week, hx of the same

## 2015-04-26 MED ORDER — HYDROCODONE-ACETAMINOPHEN 5-325 MG PO TABS: 1.0000 | ORAL_TABLET | ORAL | Status: DC | PRN

## 2015-04-26 MED ORDER — DIAZEPAM 2 MG PO TABS
2.0000 mg | ORAL_TABLET | Freq: Once | ORAL | Status: AC
  Administered 2015-04-26: 2 mg via ORAL
  Filled 2015-04-26: qty 1

## 2015-04-26 MED ORDER — HYDROCODONE-ACETAMINOPHEN 5-325 MG PO TABS
1.0000 | ORAL_TABLET | Freq: Once | ORAL | Status: AC
  Administered 2015-04-26: 1 via ORAL
  Filled 2015-04-26: qty 1

## 2015-04-26 NOTE — Discharge Instructions (Signed)
Heat Therapy °Heat therapy can help ease sore, stiff, injured, and tight muscles and joints. Heat relaxes your muscles, which may help ease your pain.  °RISKS AND COMPLICATIONS °If you have any of the following conditions, do not use heat therapy unless your health care provider has approved: °· Poor circulation. °· Healing wounds or scarred skin in the area being treated. °· Diabetes, heart disease, or high blood pressure. °· Not being able to feel (numbness) the area being treated. °· Unusual swelling of the area being treated. °· Active infections. °· Blood clots. °· Cancer. °· Inability to communicate pain. This may include young children and people who have problems with their brain function (dementia). °· Pregnancy. °Heat therapy should only be used on old, pre-existing, or long-lasting (chronic) injuries. Do not use heat therapy on new injuries unless directed by your health care provider. °HOW TO USE HEAT THERAPY °There are several different kinds of heat therapy, including: °· Moist heat pack. °· Warm water bath. °· Hot water bottle. °· Electric heating pad. °· Heated gel pack. °· Heated wrap. °· Electric heating pad. °Use the heat therapy method suggested by your health care provider. Follow your health care provider's instructions on when and how to use heat therapy. °GENERAL HEAT THERAPY RECOMMENDATIONS °· Do not sleep while using heat therapy. Only use heat therapy while you are awake. °· Your skin may turn pink while using heat therapy. Do not use heat therapy if your skin turns red. °· Do not use heat therapy if you have new pain. °· High heat or long exposure to heat can cause burns. Be careful when using heat therapy to avoid burning your skin. °· Do not use heat therapy on areas of your skin that are already irritated, such as with a rash or sunburn. °SEEK MEDICAL CARE IF: °· You have blisters, redness, swelling, or numbness. °· You have new pain. °· Your pain is worse. °MAKE SURE  YOU: °· Understand these instructions. °· Will watch your condition. °· Will get help right away if you are not doing well or get worse. °Document Released: 10/07/2011 Document Revised: 11/29/2013 Document Reviewed: 09/07/2013 °ExitCare® Patient Information ©2015 [employer]. This information is not intended to replace advice given to you by your health care provider. Make sure you discuss any questions you have with your health care provider. °Muscle Cramps and Spasms °Muscle cramps and spasms occur when a muscle or muscles tighten and you have no control over this tightening (involuntary muscle contraction). They are a common problem and can develop in any muscle. The most common place is in the calf muscles of the leg. Both muscle cramps and muscle spasms are involuntary muscle contractions, but they also have differences:  °· Muscle cramps are sporadic and painful. They may last a few seconds to a quarter of an hour. Muscle cramps are often more forceful and last longer than muscle spasms. °· Muscle spasms may or may not be painful. They may also last just a few seconds or much longer. °CAUSES  °It is uncommon for cramps or spasms to be due to a serious underlying problem. In many cases, the cause of cramps or spasms is unknown. Some common causes are:  °· Overexertion.   °· Overuse from repetitive motions (doing the same thing over and over).   °· Remaining in a certain position for a long period of time.   °· Improper preparation, form, or technique while performing a sport or activity.   °· Dehydration.   °· Injury.   °·   Side effects of some medicines.   °· Abnormally low levels of the salts and ions in your blood (electrolytes), especially potassium and calcium. This could happen if you are taking water pills (diuretics) or you are pregnant.   °Some underlying medical problems can make it more likely to develop cramps or spasms. These include, but are not limited to:  °· Diabetes.   °· Parkinson disease.    °· Hormone disorders, such as thyroid problems.   °· Alcohol abuse.   °· Diseases specific to muscles, joints, and bones.   °· Blood vessel disease where not enough blood is getting to the muscles.   °HOME CARE INSTRUCTIONS  °· Stay well hydrated. Drink enough water and fluids to keep your urine clear or pale yellow. °· It may be helpful to massage, stretch, and relax the affected muscle. °· For tight or tense muscles, use a warm towel, heating pad, or hot shower water directed to the affected area. °· If you are sore or have pain after a cramp or spasm, applying ice to the affected area may relieve discomfort. °· Put ice in a plastic bag. °· Place a towel between your skin and the bag. °· Leave the ice on for 15-20 minutes, 03-04 times a day. °· Medicines used to treat a known cause of cramps or spasms may help reduce their frequency or severity. Only take over-the-counter or prescription medicines as directed by your caregiver. °SEEK MEDICAL CARE IF:  °Your cramps or spasms get more severe, more frequent, or do not improve over time.  °MAKE SURE YOU:  °· Understand these instructions. °· Will watch your condition. °· Will get help right away if you are not doing well or get worse. °Document Released: 01/04/2002 Document Revised: 11/09/2012 Document Reviewed: 07/01/2012 °ExitCare® Patient Information ©2015 [employer]. This information is not intended to replace advice given to you by your health care provider. Make sure you discuss any questions you have with your health care provider. ° °

## 2015-04-26 NOTE — ED Provider Notes (Signed)
CSN: [id number]     Arrival date & time 04/25/15  2334 History   First MD Initiated Contact with Patient 04/26/15 0007     Chief Complaint  Patient presents with  . Back Pain     (Consider location/radiation/quality/duration/timing/severity/associated sxs/prior Treatment) Patient is a 81 y.o. male presenting with back pain. The history is provided by the patient. No language interpreter was used.  Back Pain Location:  Lumbar spine Quality:  Aching, stabbing and cramping Radiates to:  Does not radiate Pain severity:  Moderate Onset quality:  Gradual Duration:  1 week Associated symptoms: no abdominal pain, no chest pain, no fever, no numbness and no weakness   Associated symptoms comment:  Elderly patient complains of low back pain similar to previous episodes of back pain with muscle spasms. No fall or injury. The pain is in the bilateral lumbar area. It increases sharply with certain movements and resolves when at rest. No abdominal pain, loss of bladder or bowel control. He denies urinary symptoms of frequency, hematuria or difficulty. No weakness, numbness or tingling.   Past Medical History  Diagnosis Date  . Paroxysmal atrial fibrillation     cardioversion 01/25/10  . Diabetes mellitus   . Exogenous obesity   . Hypertension   . Microcytic anemia   . Hyperlipidemia   . GERD (gastroesophageal reflux disease)   . Hiatal hernia   . Osteoarthritis    Past Surgical History  Procedure Laterality Date  . Total knee arthroplasty      B  . Cardioversion  01/25/10  . Cardiovascular stress test  2007    No ischemia. EF 61%  . US echocardiography  February 2011    Normal EF; LAE and RVE  . Cardioversion N/A 08/19/2013    Procedure: CARDIOVERSION;  Surgeon: Cassell Clement, MD;  Location: [facility] ENDOSCOPY;  Service: Cardiovascular;  Laterality: N/A;  . Esophagogastroduodenoscopy N/A 06/02/2014    Procedure: ESOPHAGOGASTRODUODENOSCOPY (EGD);  Surgeon: Barrie Folk, MD;  Location: [facility]  ENDOSCOPY;  Service: Endoscopy;  Laterality: N/A;   Family History  Problem Relation Age of Onset  . Cancer Mother     deceased  . Pneumonia Father     deceased  . ALS Brother     deceased  . Cancer Brother     deceased/bone ca  . Cancer Sister     deceased  . Heart attack Neg Hx   . Stroke Neg Hx   . Hypertension Mother    Social History  Substance Use Topics  . Smoking status: Former Smoker    Quit date: 07/29/1981  . Smokeless tobacco: None  . Alcohol Use: No    Review of Systems  Constitutional: Negative for fever and chills.  Respiratory: Negative.  Negative for shortness of breath.   Cardiovascular: Negative.  Negative for chest pain.  Gastrointestinal: Negative.  Negative for nausea, vomiting and abdominal pain.  Genitourinary: Negative.  Negative for hematuria, enuresis and difficulty urinating.  Musculoskeletal: Positive for back pain. Negative for neck pain.       See HPI  Skin: Negative.   Neurological: Negative.  Negative for weakness and numbness.      Allergies  Aspirin and Trazodone and nefazodone  Home Medications   Prior to Admission medications   Medication Sig Start Date End Date Taking? Authorizing Carla Rashad  acetaminophen (TYLENOL) 500 MG tablet Take 1,000 mg by mouth every 6 (six) hours as needed. For pain   Yes Historical Berline Semrad, MD  amiodarone (PACERONE) 200 MG tablet TAKE  1 TABLET DAILY 02/03/15  Yes Cassell Clement, MD  atorvastatin (LIPITOR) 80 MG tablet Take 0.5 tablets (40 mg total) by mouth every morning. 10/08/13  Yes Marinus Maw, MD  clopidogrel (PLAVIX) 75 MG tablet Take 1 tablet (75 mg total) by mouth daily. 04/05/15  Yes Cassell Clement, MD  gemfibrozil (LOPID) 600 MG tablet Take 600 mg by mouth 2 (two) times daily.  07/20/13  Yes Historical Ben Sanz, MD  lisinopril (PRINIVIL,ZESTRIL) 40 MG tablet Take 40 mg by mouth every morning.    Yes Historical Aften Lipsey, MD  metFORMIN (GLUCOPHAGE) 500 MG tablet Take 500-1,000 mg by mouth 2  (two) times daily with a meal. 1 tab in the am and 2 tabs in the pm   Yes Historical Eila Runyan, MD  pantoprazole (PROTONIX) 40 MG tablet Take 1 tablet (40 mg total) by mouth daily. 06/03/14  Yes Ripudeep Jenna Luo, MD  tiotropium (SPIRIVA) 18 MCG inhalation capsule Place 18 mcg into inhaler and inhale daily.   Yes Historical Cydni Reddoch, MD  tiZANidine (ZANAFLEX) 2 MG tablet Take 1 tablet by mouth daily as needed. Muscle spasms 04/18/15  Yes Historical Shanta Hartner, MD  furosemide (LASIX) 40 MG tablet Take 80 mg by mouth daily.  02/11/14   Historical Kirat Mezquita, MD   BP 193/80 mmHg  Pulse 67  Temp(Src) 98.3 F (36.8 C) (Oral)  Resp 22  Ht  (1.803 m)  Wt 260 lb (117.935 kg)  BMI 36.28 kg/m2  SpO2 93% Physical Exam  Constitutional: He is oriented to person, place, and time. He appears well-developed and well-nourished.  HENT:  Head: Normocephalic.  Neck: Normal range of motion. Neck supple.  Cardiovascular: Normal rate and regular rhythm.   Pulmonary/Chest: Effort normal and breath sounds normal.  Abdominal: Soft. Bowel sounds are normal. There is no tenderness. There is no rebound and no guarding.  Musculoskeletal: Normal range of motion.  No lower back tenderness, swelling or discoloration. Movement causes sharp, shooting pain, but FROM of LE's preserved. Patient is ambulatory.   Neurological: He is alert and oriented to person, place, and time.  Skin: Skin is warm and dry. No rash noted.  Psychiatric: He has a normal mood and affect.    ED Course  Procedures (including critical care time) Labs Review Labs Reviewed - No data to display  Imaging Review No results found. I have personally reviewed and evaluated these images and lab results as part of my medical decision-making.   EKG Interpretation None      MDM   Final diagnoses:  None    1. Muscle spasm 2. Back pain  The patient has had similar symptoms in the past. Pain is with movement, better with rest. It is sharp,  shooting in nature following a muscular spasm pattern. He is able to walk and has family at home for assistance. Feel the patient can be discharged home with Rx's and PCP follow up.    Elpidio Anis, PA-C 04/26/15 0216  Loren Racer, MD 04/26/15 [phone]

## 2015-04-28 ENCOUNTER — Other Ambulatory Visit: Payer: Self-pay | Admitting: Family Medicine

## 2015-04-28 ENCOUNTER — Ambulatory Visit
Admission: RE | Admit: 2015-04-28 | Discharge: 2015-04-28 | Disposition: A | Discharge status: Home / Self Care | Payer: Medicare Other | Source: Ambulatory Visit | Attending: Family Medicine | Admitting: Family Medicine

## 2015-04-28 DIAGNOSIS — M545 Low back pain: Secondary | ICD-10-CM

## 2015-05-02 ENCOUNTER — Other Ambulatory Visit: Payer: Self-pay | Admitting: Cardiology

## 2015-05-09 ENCOUNTER — Other Ambulatory Visit: Payer: Self-pay | Admitting: Specialist

## 2015-05-09 DIAGNOSIS — S32040A Wedge compression fracture of fourth lumbar vertebra, initial encounter for closed fracture: Secondary | ICD-10-CM

## 2015-05-11 ENCOUNTER — Telehealth: Payer: Self-pay | Admitting: Cardiology

## 2015-05-11 NOTE — Telephone Encounter (Signed)
New Message       Office calling to f/u with [name] about a previous fax sent for pt to stop a blood thinner. Please call back and advise.

## 2015-05-11 NOTE — Telephone Encounter (Signed)
Waiting for  [name] to sign

## 2015-05-12 ENCOUNTER — Ambulatory Visit
Admission: RE | Admit: 2015-05-12 | Discharge: 2015-05-12 | Disposition: A | Discharge status: Home / Self Care | Payer: Medicare Other | Source: Ambulatory Visit | Attending: Specialist | Admitting: Specialist

## 2015-05-12 ENCOUNTER — Other Ambulatory Visit: Payer: Self-pay | Admitting: Specialist

## 2015-05-12 DIAGNOSIS — S32040A Wedge compression fracture of fourth lumbar vertebra, initial encounter for closed fracture: Secondary | ICD-10-CM

## 2015-05-12 NOTE — Telephone Encounter (Signed)
Reviewed by  [name] and ok to d/c Plavix 5 days prior to procedure Given to medical records to fax back

## 2015-05-18 ENCOUNTER — Other Ambulatory Visit: Payer: Medicare Other

## 2015-05-22 ENCOUNTER — Ambulatory Visit
Admission: RE | Admit: 2015-05-22 | Discharge: 2015-05-22 | Disposition: A | Discharge status: Home / Self Care | Payer: Medicare Other | Source: Ambulatory Visit | Attending: Specialist | Admitting: Specialist

## 2015-05-22 DIAGNOSIS — S32040A Wedge compression fracture of fourth lumbar vertebra, initial encounter for closed fracture: Secondary | ICD-10-CM

## 2015-05-22 MED ORDER — SODIUM CHLORIDE 0.9 % IV SOLN
Freq: Once | INTRAVENOUS | Status: AC
  Administered 2015-05-22: 08:00:00 via INTRAVENOUS

## 2015-05-22 MED ORDER — CEFAZOLIN SODIUM-DEXTROSE 2-3 GM-% IV SOLR
2.0000 g | Freq: Once | INTRAVENOUS | Status: AC
  Administered 2015-05-22: 2 g via INTRAVENOUS

## 2015-05-22 MED ORDER — FENTANYL CITRATE (PF) 100 MCG/2ML IJ SOLN
25.0000 ug | INTRAMUSCULAR | Status: DC | PRN
  Administered 2015-05-22 (×3): 50 ug via INTRAVENOUS

## 2015-05-22 MED ORDER — KETOROLAC TROMETHAMINE 30 MG/ML IJ SOLN: 30.0000 mg | Freq: Once | INTRAMUSCULAR | Status: DC

## 2015-05-22 MED ORDER — MIDAZOLAM HCL 2 MG/2ML IJ SOLN
1.0000 mg | INTRAMUSCULAR | Status: DC | PRN
  Administered 2015-05-22: 1 mg via INTRAVENOUS

## 2015-05-22 NOTE — Discharge Instructions (Signed)
Vertebroplasty Post Procedure Discharge Instructions  1. May resume a regular diet and any medications that you routinely take (including pain medications). 2. No driving day of procedure. 3. Upon discharge go home and rest.  May use an ice pack as needed to injection sites on back. 4. Remove bandades after your shower in the morning and replace daily until healed. 5. Follow up with Dr. Jillyn HiddenBean in 2 weeks. 6. No heavy lifting, nothing heavier than a gallon of milk.    Please contact our office at [phone] for the following symptoms:   Fever greater than 100 degrees  Increased swelling, pain, or redness at injection site.   [facility].   MAY RESUME PLAVIX TODAY.

## 2015-05-22 NOTE — Progress Notes (Signed)
Sedation time for L4 VP was 33 minutes.  Donell SievertJeanne Doshia Dalia, RN

## 2015-05-22 NOTE — Progress Notes (Signed)
Pt states he has been off Plavix for the past 5 days.  Discharge instructions explained to pt and daughter.

## 2015-07-18 ENCOUNTER — Other Ambulatory Visit: Payer: Self-pay | Admitting: Cardiology

## 2015-07-18 MED ORDER — FUROSEMIDE 40 MG PO TABS: 80.0000 mg | ORAL_TABLET | Freq: Every day | ORAL | Status: DC

## 2015-07-26 ENCOUNTER — Other Ambulatory Visit: Payer: Self-pay | Admitting: *Deleted

## 2015-07-26 MED ORDER — FUROSEMIDE 40 MG PO TABS: 80.0000 mg | ORAL_TABLET | Freq: Every day | ORAL | Status: DC

## 2015-07-28 ENCOUNTER — Telehealth: Payer: Self-pay | Admitting: Cardiology

## 2015-07-28 NOTE — Telephone Encounter (Signed)
Pt needs refill of furosemide 90 day supply with [facility]ills [facility]

## 2015-08-01 ENCOUNTER — Other Ambulatory Visit: Payer: Self-pay | Admitting: Cardiology

## 2015-08-30 ENCOUNTER — Encounter: Payer: Self-pay | Admitting: Cardiology

## 2015-08-30 ENCOUNTER — Ambulatory Visit (INDEPENDENT_AMBULATORY_CARE_PROVIDER_SITE_OTHER): Payer: Medicare Other | Admitting: Cardiology

## 2015-08-30 VITALS — BP 140/80 | HR 82 | Ht 72.0 in | Wt 244.0 lb

## 2015-08-30 DIAGNOSIS — R609 Edema, unspecified: Secondary | ICD-10-CM

## 2015-08-30 DIAGNOSIS — I480 Paroxysmal atrial fibrillation: Secondary | ICD-10-CM | POA: Diagnosis not present

## 2015-08-30 DIAGNOSIS — R55 Syncope and collapse: Secondary | ICD-10-CM

## 2015-08-30 NOTE — Patient Instructions (Addendum)
Medication Instructions:  Your physician recommends that you continue on your current medications as directed. Please refer to the Current Medication list given to you today.  Labwork: none  Testing/Procedures: Your physician has requested that you have an echocardiogram. Echocardiography is a painless test that uses sound waves to create images of your heart. It provides your doctor with information about the size and shape of your heart and how well your heart's chambers and valves are working. This procedure takes approximately one hour. There are no restrictions for this procedure.  Your physician has recommended that you wear an event monitor. Event monitors are medical devices that record the heart's electrical activity. Doctors most often [place] these monitors to diagnose arrhythmias. Arrhythmias are problems with the speed or rhythm of the heartbeat. The monitor is a small, portable device. You can wear one while you do your normal daily activities. This is usually used to diagnose what is causing palpitations/syncope (passing out).  Follow-Up: Your physician wants you to follow-up in: 4 months ov with Dr Algis Liming will receive a reminder letter in the mail two months in advance. If you don't receive a letter, please call our office to schedule the follow-up appointment.  If you need a refill on your cardiac medications before your next appointment, please call your pharmacy.

## 2015-08-30 NOTE — Progress Notes (Signed)
Cardiology Office Note   Date:  08/30/2015   ID:  CORDON GASSETT, DOB [birth year], MRN [id number]  PCP:  Mickie Hillier, MD  Cardiologist: Cassell Clement MD  Chief Complaint  Patient presents with  . Loss of Consciousness    falls      History of Present Illness: Omar Chan is a 82 y.o. male who presents for follow-up office visit.  He has been having frequent episodes of presyncope and falling.  The patient has a history of paroxysmal atrial flutter fibrillation. Has hypertensive heart disease, DM, obesity, HLD, GERD, OA, hiatal hernia and PAF. Has had past spell in December of 2014 where he had a spell where he had memory problems, difficulty with his gait and concern that he may have had a TIA. He is limited by chronic dyspnea/arthritis and obesity. Remains quite sedentary. Has had past cardioversion on 08/23/13. The patient was hospitalized on 09/12/13 until 09/14/13 for marked bradycardia. He was admitted initially thinking he might need a pacemaker. However when his beta blocker and diltiazem was held, his heart rate improved and he did not require a pacemaker. His only antiarrhythmic now is low dose amiodarone.He is no longer on Coumadin. He is on Plavix. He is not on aspirin. Does have chronic GI bleeding. Source of the bleeding is unknown.  For the past several months the patient has been having falling spells.  His daughter estimates that he falls about once every week or 2.  She does not think that he actually totally loses consciousness.  He just collapses on the floor.  At times it was felt that the spells of falling were related to the intense pain that he was having in his back from a compression fracture.  He underwent kyphoplasty plasty in October 2016 and his back pain has improved.  Serial physical therapy at home. His last echocardiogram was in January 2015 and showed mild LVH with normal left ventricular systolic function.  There was pulmonary  hypertension. The patient is unaware of his heart rate.  Today he is back in atrial flutter fibrillation.  His last visit he was in normal sinus rhythm.  He has not been aware of any palpitations.  Past Medical History  Diagnosis Date  . Paroxysmal atrial fibrillation (HCC)     cardioversion 01/25/10  . Diabetes mellitus   . Exogenous obesity   . Hypertension   . Microcytic anemia   . Hyperlipidemia   . GERD (gastroesophageal reflux disease)   . Hiatal hernia   . Osteoarthritis     Past Surgical History  Procedure Laterality Date  . Total knee arthroplasty      B  . Cardioversion  01/25/10  . Cardiovascular stress test  2007    No ischemia. EF 61%  . US echocardiography  February 2011    Normal EF; LAE and RVE  . Cardioversion N/A 08/19/2013    Procedure: CARDIOVERSION;  Surgeon: Cassell Clement, MD;  Location: [facility] ENDOSCOPY;  Service: Cardiovascular;  Laterality: N/A;  . Esophagogastroduodenoscopy N/A 06/02/2014    Procedure: ESOPHAGOGASTRODUODENOSCOPY (EGD);  Surgeon: Barrie Folk, MD;  Location: [facility] ENDOSCOPY;  Service: Endoscopy;  Laterality: N/A;     Current Outpatient Prescriptions  Medication Sig Dispense Refill  . acetaminophen (TYLENOL) 500 MG tablet Take 1,000 mg by mouth every 6 (six) hours as needed. For pain    . amiodarone (PACERONE) 200 MG tablet TAKE 1 TABLET DAILY 90 tablet 0  . atorvastatin (LIPITOR) 40 MG tablet Take  40 mg by mouth daily.    Marland Kitchen atorvastatin (LIPITOR) 80 MG tablet Take 0.5 tablets (40 mg total) by mouth every morning. 15 tablet 6  . clopidogrel (PLAVIX) 75 MG tablet Take 1 tablet (75 mg total) by mouth daily. 90 tablet 1  . furosemide (LASIX) 40 MG tablet Take 40 mg by mouth daily.    Marland Kitchen gemfibrozil (LOPID) 600 MG tablet Take 600 mg by mouth 2 (two) times daily.     Marland Kitchen glimepiride (AMARYL) 1 MG tablet Take 1 mg by mouth daily.    Marland Kitchen lisinopril (PRINIVIL,ZESTRIL) 40 MG tablet Take 40 mg by mouth every morning.     . pantoprazole (PROTONIX) 40 MG  tablet Take 40 mg by mouth 2 (two) times daily.    Marland Kitchen tiotropium (SPIRIVA) 18 MCG inhalation capsule Place 18 mcg into inhaler and inhale daily.     No current facility-administered medications for this visit.    Allergies:   Aspirin and Trazodone and nefazodone    Social History:  The patient  reports that he quit smoking about 34 years ago. He does not have any smokeless tobacco history on file. He reports that he does not drink alcohol or use illicit drugs.   Family History:  The patient's family history includes ALS in his brother; Cancer in his brother, mother, and sister; Hypertension in his mother; Pneumonia in his father. There is no history of Heart attack or Stroke.    ROS:  Please see the history of present illness.   Otherwise, review of systems are positive for none.   All other systems are reviewed and negative.    PHYSICAL EXAM: VS:  BP 140/80 mmHg  Pulse 82  Ht 6' (1.829 m)  Wt 244 lb (110.678 kg)  BMI 33.09 kg/m2 , BMI Body mass index is 33.09 kg/(m^2). GEN: Well nourished, well developed, in no acute distress HEENT: normal Neck: no JVD, carotid bruits, or masses Cardiac: Irregularly irregular rhythm.  Grade 2/6 systolic ejection murmur at the aortic area.  No gallop.  There is 2+ pretibial edema which is chronic Respiratory:  clear to auscultation bilaterally, normal work of breathing GI: soft, nontender, nondistended, + BS MS: no deformity or atrophy Skin: warm and dry, no rash Neuro:  Strength and sensation are intact Psych: euthymic mood, full affect   EKG:  EKG is ordered today. The ekg ordered today demonstrates atrial flutter with variable ventricular response.  There is left axis deviation and incomplete right bundle branch block.   Recent Labs: No results found for requested labs within last 365 days.    Lipid Panel    Component Value Date/Time   CHOL 154 06/06/2011 0852   TRIG 115.0 06/06/2011 0852   HDL 48.90 06/06/2011 0852   CHOLHDL 3  06/06/2011 0852   VLDL 23.0 06/06/2011 0852   LDLCALC 82 06/06/2011 0852      Wt Readings from Last 3 Encounters:  08/30/15 244 lb (110.678 kg)  05/22/15 254 lb (115.214 kg)  05/12/15 260 lb (117.935 kg)      Other studies Reviewed: Additional studies/ records that were reviewed today include: . Review of the above records demonstrates:  Echocardiogram on 08/04/13 demonstrates: - Left ventricle: The cavity size was normal. There was mild concentric hypertrophy. Systolic function was normal. The estimated ejection fraction was in the range of 55% to 60%. Wall motion was normal; there were no regional wall motion abnormalities. Atrial fibrillation with rapid ventricular response precludes evaluation of LV diastolic function. -  Left atrium: The atrium was severely dilated. - Right atrium: The atrium was mildly dilated. - Tricuspid valve: Mild-moderate regurgitation directed centrally. - Pulmonary arteries: Systolic pressure was moderately increased. PA peak pressure: 58mm Hg (S).  ASSESSMENT AND PLAN:  1. Paroxysmal atrial fibrillation, on low-dose amiodarone, back in atrial flutter fibrillation today.  Past history of occult GI bleeding.  Not on anticoagulation. 2. hypertensive heart disease. 3. Obesity 4. diabetes mellitus 5. Dyslipidemia 6. significant lower extremity edema 7. Anemia, followed by Dr. Carolin Guernsey 8.  Questionable history of prior TIA.  On clopidogrel 9.  Episodes of presyncope associated with mechanical falls.  Rule out arrhythmia as a contributing cause.   Current medicines are reviewed at length with the patient today.  The patient does not have concerns regarding medicines.  The following changes have been made:  no change  Labs/ tests ordered today include:   Orders Placed This Encounter  Procedures  . Cardiac event monitor  . EKG 12-Lead  . ECHOCARDIOGRAM COMPLETE    Disposition: We will update his echocardiogram.  We  will have him get a 30 day event monitor.  Continue current medication.  Recheck in 4 months for office visit with Dr. Anne Fu  Signed, Cassell Clement MD 08/30/2015 12:56 PM    [facility] [address] [place], [place], [place]  [address] Phone: [phone]; Fax: [phone]

## 2015-09-06 ENCOUNTER — Ambulatory Visit: Payer: Medicare Other | Admitting: Cardiology

## 2015-09-12 ENCOUNTER — Other Ambulatory Visit: Payer: Self-pay

## 2015-09-12 ENCOUNTER — Ambulatory Visit (HOSPITAL_COMMUNITY): Payer: Medicare Other | Attending: Cardiology

## 2015-09-12 ENCOUNTER — Ambulatory Visit (INDEPENDENT_AMBULATORY_CARE_PROVIDER_SITE_OTHER): Payer: Medicare Other

## 2015-09-12 ENCOUNTER — Telehealth: Payer: Self-pay

## 2015-09-12 DIAGNOSIS — E785 Hyperlipidemia, unspecified: Secondary | ICD-10-CM | POA: Insufficient documentation

## 2015-09-12 DIAGNOSIS — I340 Nonrheumatic mitral (valve) insufficiency: Secondary | ICD-10-CM | POA: Diagnosis not present

## 2015-09-12 DIAGNOSIS — I071 Rheumatic tricuspid insufficiency: Secondary | ICD-10-CM | POA: Insufficient documentation

## 2015-09-12 DIAGNOSIS — I480 Paroxysmal atrial fibrillation: Secondary | ICD-10-CM | POA: Diagnosis not present

## 2015-09-12 DIAGNOSIS — E119 Type 2 diabetes mellitus without complications: Secondary | ICD-10-CM | POA: Diagnosis not present

## 2015-09-12 DIAGNOSIS — R55 Syncope and collapse: Secondary | ICD-10-CM

## 2015-09-12 DIAGNOSIS — I10 Essential (primary) hypertension: Secondary | ICD-10-CM | POA: Insufficient documentation

## 2015-09-12 DIAGNOSIS — I517 Cardiomegaly: Secondary | ICD-10-CM | POA: Diagnosis not present

## 2015-09-12 NOTE — Telephone Encounter (Signed)
Omar Chan from [employer] called to report baseline reading of afib in the 80s.   To Dr. Patty Sermons.

## 2015-09-18 ENCOUNTER — Telehealth: Payer: Self-pay | Admitting: Cardiology

## 2015-09-18 NOTE — Telephone Encounter (Signed)
New message     Patient calling disconnect the monitor on Saturday due to constant beeping- she called the company they advise to removed and return to company.

## 2015-09-18 NOTE — Telephone Encounter (Signed)
Spoke with wife and they are sending monitor back tomorrow secondary to constant beeping Patient does not want to wear another monitor Will forward to  Dr. Patty Sermons so he will be aware

## 2015-09-19 NOTE — Telephone Encounter (Signed)
Okay 

## 2015-10-02 ENCOUNTER — Other Ambulatory Visit: Payer: Self-pay | Admitting: Cardiology

## 2015-10-02 ENCOUNTER — Telehealth: Payer: Self-pay | Admitting: Cardiology

## 2015-10-02 NOTE — Telephone Encounter (Signed)
Pt wants his echo and monitor results. Says he have been calling for these.

## 2015-10-02 NOTE — Telephone Encounter (Signed)
**Note De-Identified  Obfuscation** The pt has been re given his Echo results and he verbalized understanding.   He is advised that our monitor tech, Burnett HarryShelly, was unaware that he took his monitor off and returned to the company after 5 days of wearing. He is aware that his monitor results have been forwarded to Dr Anne FuSkains (His new cardiologist since Dr Patty SermonsBrackbill retired) for his review and that we will contact him with his results when available. He verbalized understanding.

## 2015-10-06 ENCOUNTER — Ambulatory Visit (INDEPENDENT_AMBULATORY_CARE_PROVIDER_SITE_OTHER): Payer: Medicare Other | Admitting: Ophthalmology

## 2015-10-06 DIAGNOSIS — I10 Essential (primary) hypertension: Secondary | ICD-10-CM | POA: Diagnosis not present

## 2015-10-06 DIAGNOSIS — H35033 Hypertensive retinopathy, bilateral: Secondary | ICD-10-CM

## 2015-10-06 DIAGNOSIS — H35342 Macular cyst, hole, or pseudohole, left eye: Secondary | ICD-10-CM

## 2015-10-06 DIAGNOSIS — H43813 Vitreous degeneration, bilateral: Secondary | ICD-10-CM | POA: Diagnosis not present

## 2015-10-13 ENCOUNTER — Ambulatory Visit (INDEPENDENT_AMBULATORY_CARE_PROVIDER_SITE_OTHER): Payer: Medicare Other | Admitting: Ophthalmology

## 2015-10-28 ENCOUNTER — Other Ambulatory Visit: Payer: Self-pay | Admitting: Cardiology

## 2015-12-14 ENCOUNTER — Other Ambulatory Visit: Payer: Self-pay | Admitting: Cardiology

## 2015-12-29 ENCOUNTER — Ambulatory Visit: Payer: Medicare Other | Admitting: Cardiology

## 2016-01-12 ENCOUNTER — Encounter: Payer: Self-pay | Admitting: Cardiology

## 2016-01-12 ENCOUNTER — Ambulatory Visit (INDEPENDENT_AMBULATORY_CARE_PROVIDER_SITE_OTHER): Payer: Medicare Other | Admitting: Cardiology

## 2016-01-12 VITALS — BP 154/92 | HR 76 | Ht 72.0 in | Wt 241.0 lb

## 2016-01-12 DIAGNOSIS — I481 Persistent atrial fibrillation: Secondary | ICD-10-CM

## 2016-01-12 DIAGNOSIS — I10 Essential (primary) hypertension: Secondary | ICD-10-CM | POA: Diagnosis not present

## 2016-01-12 DIAGNOSIS — I429 Cardiomyopathy, unspecified: Secondary | ICD-10-CM

## 2016-01-12 DIAGNOSIS — I4819 Other persistent atrial fibrillation: Secondary | ICD-10-CM

## 2016-01-12 NOTE — Patient Instructions (Signed)
Medication Instructions:  Please stop your Amiodarone and gemfibrozil. Continue all other medications as listed.  Follow-Up: Follow up in 1 year with Dr. Anne FuSkains.  You will receive a letter in the mail 2 months before you are due.  Please call us when you receive this letter to schedule your follow up appointment.  If you need a refill on your cardiac medications before your next appointment, please call your pharmacy.  Thank you for choosing [facility]!!

## 2016-01-12 NOTE — Progress Notes (Signed)
Cardiology Office Note    Date:  01/12/2016   ID:  Omar Chan, DOB February 15, 1926, MRN [id number]  PCP:  Mickie Hillier, MD  Cardiologist:   Donato Schultz, MD     History of Present Illness:  Omar Chan is a 82 y.o. male former patient of Dr. Yevonne Pax who was recently evaluated in February 2017 with frequent falls, possible loss of consciousness, wore a event monitor for proximal leg 5 days but turned it in after excessive beeping, here for follow-up. He has a history of paroxysmal atrial fibrillation/flutter, diabetes, obesity, hyperlipidemia. In 2014 December, had a spell where he had memory issues, difficulty with his gait and concerned that he may have had a TIA. He is limited by chronic dyspnea, arthritis, obesity. Quite sedentary. In the past he has had cardioversion 08/23/13. He was also hospitalized for 2 days in mid February 2015 for marked bradycardia. Initially he was admitted thinking that he may need a pacemaker however when his beta blocker and diltiazem were held his heart rate improved and he did not require this. His only antiarrhythmic is now low-dose amiodarone. Not on Coumadin. Because of falls. He is on Plavix. No aspirin. He does have chronic GI bleeding, source is unknown.  Prior workup, falling spells, falling once every 2 weeks. Does not think that he actually loses consciousness. He collapses to the floor. At times it was felt that the spells of falling were related to the intense pain that he was having from back compression fracture. He underwent kyphoplasty in October 2016. His back pain had improved.  Echocardiogram showed mild LVH with normal LV function. Mild pulmonary hypertension.  At last visit with Dr. Patty Sermons in February 2017 he was back in atrial fibrillation. He was not aware of any palpitations.  Overall he has not had any further falls. Doing well. I saw his wife, Omar Chan in the hospital following a knee surgery where she had atrial fibrillation.  She is a patient of Dr. Elvis Coil. She had hallucinations at the time.  No chest pain, no shortness of breath.    Past Medical History  Diagnosis Date  . Paroxysmal atrial fibrillation (HCC)     cardioversion 01/25/10  . Diabetes mellitus   . Exogenous obesity   . Hypertension   . Microcytic anemia   . Hyperlipidemia   . GERD (gastroesophageal reflux disease)   . Hiatal hernia   . Osteoarthritis     Past Surgical History  Procedure Laterality Date  . Total knee arthroplasty      B  . Cardioversion  01/25/10  . Cardiovascular stress test  2007    No ischemia. EF 61%  . US echocardiography  February 2011    Normal EF; LAE and RVE  . Cardioversion N/A 08/19/2013    Procedure: CARDIOVERSION;  Surgeon: Cassell Clement, MD;  Location: [facility] ENDOSCOPY;  Service: Cardiovascular;  Laterality: N/A;  . Esophagogastroduodenoscopy N/A 06/02/2014    Procedure: ESOPHAGOGASTRODUODENOSCOPY (EGD);  Surgeon: Barrie Folk, MD;  Location: [facility] ENDOSCOPY;  Service: Endoscopy;  Laterality: N/A;    Current Medications: Outpatient Prescriptions Prior to Visit  Medication Sig Dispense Refill  . acetaminophen (TYLENOL) 500 MG tablet Take 1,000 mg by mouth every 6 (six) hours as needed. For pain    . atorvastatin (LIPITOR) 40 MG tablet Take 40 mg by mouth daily.    . clopidogrel (PLAVIX) 75 MG tablet Take 1 tablet (75 mg total) by mouth daily. 90 tablet 1  . furosemide (LASIX) 40  MG tablet Take 40 mg by mouth daily.    Marland Kitchen. glimepiride (AMARYL) 1 MG tablet Take 1 mg by mouth daily.    Marland Kitchen. lisinopril (PRINIVIL,ZESTRIL) 40 MG tablet Take 40 mg by mouth every morning.     . pantoprazole (PROTONIX) 40 MG tablet Take 40 mg by mouth 2 (two) times daily.    Marland Kitchen. tiotropium (SPIRIVA) 18 MCG inhalation capsule Place 18 mcg into inhaler and inhale daily.    Marland Kitchen. amiodarone (PACERONE) 200 MG tablet Take 1 tablet (200 mg total) by mouth daily. 90 tablet 2  . gemfibrozil (LOPID) 600 MG tablet Take 600 mg by mouth 2 (two) times  daily.     Marland Kitchen. atorvastatin (LIPITOR) 80 MG tablet Take 0.5 tablets (40 mg total) by mouth every morning. 15 tablet 6   No facility-administered medications prior to visit.     Allergies:   Aspirin and Trazodone and nefazodone   Social History   Social History  . Marital Status: Married    Spouse Name: N/A  . Number of Children: N/A  . Years of Education: N/A   Social History Main Topics  . Smoking status: Former Smoker    Quit date: 07/29/1981  . Smokeless tobacco: None  . Alcohol Use: No  . Drug Use: No  . Sexual Activity: Yes   Other Topics Concern  . None   Social History Narrative     Family History:  The patient's family history includes ALS in his brother; Cancer in his brother, mother, and sister; Hypertension in his mother; Pneumonia in his father. There is no history of Heart attack or Stroke.   ROS:   Please see the history of present illness.    ROS All other systems reviewed and are negative.   PHYSICAL EXAM:   VS:  BP 154/92 mmHg  Pulse 76  Ht 6' (1.829 m)  Wt 241 lb (109.317 kg)  BMI 32.68 kg/m2   GEN: Well nourished, well developed, in no acute distress HEENT: normal Neck: no JVD, carotid bruits, or masses Cardiac: Irregularly irregular, normal rate; no murmurs, rubs, or gallops,no edema  Respiratory:  clear to auscultation bilaterally, normal work of breathing GI: soft, nontender, nondistended, + BS, overweight MS: no deformity or atrophy Skin: warm and dry, no rash Neuro:  Alert and Oriented x 3, Strength and sensation are intact Psych: euthymic mood, full affect  Wt Readings from Last 3 Encounters:  01/12/16 241 lb (109.317 kg)  08/30/15 244 lb (110.678 kg)  05/22/15 254 lb (115.214 kg)      Studies/Labs Reviewed:   EKG:  None today  Recent Labs: No results found for requested labs within last 365 days.   Lipid Panel    Component Value Date/Time   CHOL 154 06/06/2011 0852   TRIG 115.0 06/06/2011 0852   HDL 48.90 06/06/2011 0852    CHOLHDL 3 06/06/2011 0852   VLDL 23.0 06/06/2011 0852   LDLCALC 82 06/06/2011 0852    Additional studies/ records that were reviewed today include:  Event monitor 09/12/15  Atrial fibrillation/flutter persistent  Occasional PVC's  No adverse pauses  Good rate control of AFIB/Flutter (56-91 BPM)  No cardiac rhythm explanation for syncope.  In reading [name] note, not on anticoagulation because of prior GI bleeding.  Donato SchultzSKAINS, Asher Torpey, MD  Echocardiogram 09/12/15: - Left ventricle: The cavity size was normal. Wall thickness was  increased in a pattern of mild LVH. Systolic function was  moderately reduced. The estimated ejection fraction was in the  range of 35% to 40%. Diffuse hypokinesis. - Aortic valve: Valve mobility was restricted. - Left atrium: The atrium was severely dilated. - Right atrium: The atrium was mildly dilated.  Impressions:  - Moderate global reduction in LV function; biatrial enlargement;  trace MR and TR. Compared to 08/04/13, LV function is worse.   ASSESSMENT:    1. Persistent atrial fibrillation (HCC)   2. Essential hypertension   3. Cardiomyopathy [facility])      PLAN:  In order of problems listed above:  Persistent atrial fibrillation  - In February 2017 he was back in atrial fibrillation despite amiodarone 200 mg once a day. Normal ejection fraction. He was On amiodarone. He is not on anti-quite relation because of GI bleeding occult as well as frequent falls.  - Because he is demonstrate in persistent atrial fibrillation of the last few months, I will discontinue his amiodarone 200 mg. He will be seeing Dr. [name] in August 2017. If rate control becomes an issue, potentially a low-dose beta blocker can be utilized. Remember, beta blocker and calcium channel blocker was discontinued because of significant bradycardia previously.  Questionable history of TIA  - On Plavix  Episodes of presyncope associated with mechanical falls  - No  cardiac explanation for presyncope. Atrial fibrillation was persistent however with heart rate ranging from 60-90.  - Looks up losses balance, certainly equilibrium issue. Careful. Ambulates with cane.   Cardiomyopathy  - EF on most recent echocardiogram is worse, 35-40%.  - Continuing with lisinopril.  - Avoiding beta blockers because of prior history of bradycardia.  - Overall no signal in shortness of breath. Doing well. No radical changes.  Hyper lipidemia  - I will go ahead and discontinue his gemfibrozil drug regimen especially at his age. He has not had any episodes of pancreatitis. Dr. [name] will be checking his physical exam blood work in August.  Medication Adjustments/Labs and Tests Ordered: Current medicines are reviewed at length with the patient today.  Concerns regarding medicines are outlined above.  Medication changes, Labs and Tests ordered today are listed in the Patient Instructions below. Patient Instructions  Medication Instructions:  Please stop your Amiodarone and gemfibrozil. Continue all other medications as listed.  Follow-Up: Follow up in 1 year with Dr. Anne Fu.  You will receive a letter in the mail 2 months before you are due.  Please call us when you receive this letter to schedule your follow up appointment.  If you need a refill on your cardiac medications before your next appointment, please call your pharmacy.  Thank you for choosing [facility]!!          Signed, Donato Schultz, MD  01/12/2016 10:08 AM    [facility] [address] [place], [place], [place]  [address] Phone: [phone]; Fax: [phone]

## 2016-02-27 ENCOUNTER — Ambulatory Visit (INDEPENDENT_AMBULATORY_CARE_PROVIDER_SITE_OTHER): Payer: Medicare Other | Admitting: Neurology

## 2016-02-27 ENCOUNTER — Encounter: Payer: Self-pay | Admitting: Neurology

## 2016-02-27 VITALS — BP 152/107 | HR 120 | Ht 72.0 in | Wt 248.8 lb

## 2016-02-27 DIAGNOSIS — R531 Weakness: Secondary | ICD-10-CM

## 2016-02-27 DIAGNOSIS — R0683 Snoring: Secondary | ICD-10-CM | POA: Diagnosis not present

## 2016-02-27 DIAGNOSIS — F0390 Unspecified dementia without behavioral disturbance: Secondary | ICD-10-CM | POA: Diagnosis not present

## 2016-02-27 DIAGNOSIS — R443 Hallucinations, unspecified: Secondary | ICD-10-CM

## 2016-02-27 DIAGNOSIS — G4719 Other hypersomnia: Secondary | ICD-10-CM | POA: Diagnosis not present

## 2016-02-27 DIAGNOSIS — W19XXXA Unspecified fall, initial encounter: Secondary | ICD-10-CM

## 2016-02-27 DIAGNOSIS — G4733 Obstructive sleep apnea (adult) (pediatric): Secondary | ICD-10-CM

## 2016-02-27 NOTE — Progress Notes (Signed)
[facility]    Provider:  Dr Lucia Gaskins Referring Provider: Catha Gosselin, MD Primary Care Physician:  Mickie Hillier, MD  CC:  Hallucinations  HPI:  Omar Chan is a 82 y.o. male here as a referral from Dr. [name] for hallucinations. Past medical history of recently discover B12 deficiency and started getting B12 shots, hypertension, diabetes, high cholesterol, atrial fibrillation, diabetes, COPD. Here with wife and daughter. Hallucinations started after Christmas after he started falling. Wife and daughter provide most information. He hit his head. Unclear, maybe had several falls beforehand. Yesterday he thought the cat was chasing mice in the house. He believes his hallucinations. He sees his dead nephew sometimes. He sees different people in the bedroom. He has som eimaginary friends who sleeps with him. He hears gospel music. Music stays on all the time. Usually happens when he wakes up he sleeps off an on all day long doesn't sleep well at night. He snores and talks in his sleep. He gets up lots of times at night. Snores loudly per daughter. He has sleep apnea, had a test years ago and doesn;t have a cpap. He has short-term memory problems, slowly progressive. More short term memory. He forgets appointments. No known family history of dementia. No significant alcohol use. He has a FHx of ALS. Sister has alzheimers. Wife and daughter agree there is definitely progressive memory changes for several years or more. He has weakness in his legs with falls. He has physical therapy.    Reviewed notes, labs and imaging from outside physicians, which showed:  LDL 90, A1c 6.3, BUN 20, creatinine 0.89 obtained 02/07/2016, TSH 3.9  A. fib treated with amiodarone and Plavix, not taking Coumadin due to history of GI bleed. For several months patient is seen hallucinations where he sees to coming in the house and at nighttime is sometimes talking to people that he sees and sometimes he  hears music. These hallucinations are not affecting his functioning.  He has a history of COPD and at one point was using Spiriva. History of extensive smoking history. Also with weakness in the legs and difficulty standing. Rare alcohol use. Former smoker. 3 pack per day history.  CT of the head 2015, personally reviewed and agree with findings: Bony calvarium appears intact. Mild diffuse cortical atrophy is noted. Mild chronic ischemic white matter disease is noted. No mass effect or midline shift is noted. Ventricular size is within normal limits. There is no evidence of mass lesion, hemorrhage or acute infarction.  IMPRESSION: Mild diffuse cortical atrophy. Mild chronic ischemic white matter disease. No acute intracranial abnormality seen.    Review of Systems: Patient complains of symptoms per HPI as well as the following symptoms: Easy bruising, fatigue, shortness of breath, increased thirst, swelling in legs, hearing loss, spinning sensation, diarrhea, memory loss, confusion, insomnia, snoring, too much sleep, decreased energy, disinterest in activities, hallucinations. Pertinent negatives per HPI. All others negative.   Social History   Social History  . Marital status: Married    Spouse name: [name]  . Number of children: 4  . Years of education: 12   Occupational History  . Retired    Social History Main Topics  . Smoking status: Former Smoker    Quit date: 07/29/1981  . Smokeless tobacco: Never Used  . Alcohol use No  . Drug use: No  . Sexual activity: Yes   Other Topics Concern  . Not on file   Social History Narrative   Lives with wife  and daughter   Caffeine use: decaf coffee- 2-3 per day    Family History  Problem Relation Age of Onset  . Cancer Mother     deceased  . Hypertension Mother   . Pneumonia Father     deceased  . ALS Brother     deceased  . Cancer Brother     deceased/bone ca  . Cancer Sister     deceased  . Heart attack Neg Hx   .  Stroke Neg Hx     Past Medical History:  Diagnosis Date  . Diabetes mellitus   . Exogenous obesity   . GERD (gastroesophageal reflux disease)   . Hiatal hernia   . Hyperlipidemia   . Hypertension   . Microcytic anemia   . Osteoarthritis   . Paroxysmal atrial fibrillation (HCC)    cardioversion 01/25/10    Past Surgical History:  Procedure Laterality Date  . CARDIOVASCULAR STRESS TEST  2007   No ischemia. EF 61%  . CARDIOVERSION  01/25/10  . CARDIOVERSION N/A 08/19/2013   Procedure: CARDIOVERSION;  Surgeon: Cassell Clement, MD;  Location: [facility] ENDOSCOPY;  Service: Cardiovascular;  Laterality: N/A;  . ESOPHAGOGASTRODUODENOSCOPY N/A 06/02/2014   Procedure: ESOPHAGOGASTRODUODENOSCOPY (EGD);  Surgeon: Barrie Folk, MD;  Location[facility] ENDOSCOPY;  Service: Endoscopy;  Laterality: N/A;  . TOTAL KNEE ARTHROPLASTY     B  . US ECHOCARDIOGRAPHY  February 2011   Normal EF; LAE and RVE    Current Outpatient Prescriptions  Medication Sig Dispense Refill  . acetaminophen (TYLENOL) 500 MG tablet Take 1,000 mg by mouth every 6 (six) hours as needed. For pain    . atorvastatin (LIPITOR) 40 MG tablet Take 40 mg by mouth daily.    . clopidogrel (PLAVIX) 75 MG tablet Take 1 tablet (75 mg total) by mouth daily. 90 tablet 1  . furosemide (LASIX) 40 MG tablet Take 40 mg by mouth daily.    Marland Kitchen glimepiride (AMARYL) 1 MG tablet Take 1 mg by mouth daily.    Marland Kitchen lisinopril (PRINIVIL,ZESTRIL) 40 MG tablet Take 40 mg by mouth every morning.     . pantoprazole (PROTONIX) 40 MG tablet Take 40 mg by mouth 2 (two) times daily.    Marland Kitchen tiotropium (SPIRIVA) 18 MCG inhalation capsule Place 18 mcg into inhaler and inhale daily.     No current facility-administered medications for this visit.     Allergies as of 02/27/2016 - Review Complete 02/27/2016  Allergen Reaction Noted  . Aspirin Hives 08/06/2010  . Trazodone and nefazodone Other (See Comments) 04/26/2015    Vitals: BP (!) 152/107 (BP Location: Right Arm,  Patient Position: Sitting, Cuff Size: Normal)   Pulse (!) 120   Ht 6' (1.829 m)   Wt 248 lb 12.8 oz (112.9 kg)   BMI 33.74 kg/m  Last Weight:  Wt Readings from Last 1 Encounters:  02/27/16 248 lb 12.8 oz (112.9 kg)   Last Height:   Ht Readings from Last 1 Encounters:  02/27/16 6' (1.829 m)    Physical exam: Exam: Gen: NAD, not conversant,  obese                 CV: RRR, no MRG. No Carotid Bruits. No peripheral edema, warm, nontender Eyes: Conjunctivae clear without exudates or hemorrhage  Neuro: Detailed Neurologic Exam  Speech:    Speech is normal; fluent and spontaneous with impaired comprehension.  Cognition:  MMSE - Mini Mental State Exam 02/27/2016  Orientation to time 4  Orientation to Place  4  Registration 3  Attention/ Calculation 5  Recall 2  Language- name 2 objects 2  Language- repeat 1  Language- follow 3 step command 3  Language- read & follow direction 1  Write a sentence 1  Copy design 0  Total score 26      The patient is oriented to person, place, and time;     recent and remote memory impaired ;     language fluent;     Impaired attention, concentration,     fund of knowledge impaired Cranial Nerves:    The pupils are equal, round, and reactive to light. Attempted fundoscopic exam coul dnot visualize due to small pupils. Visual fields are full to finger confrontation. Extraocular movements are intact. Trigeminal sensation is intact and the muscles of mastication are normal. The face is symmetric. The palate elevates in the midline. Hearing intact. Voice is normal. Shoulder shrug is normal. The tongue has normal motion without fasciculations.   Coordination:    No dysmetria. Normal rapid alternating movements.   Gait: Cannot get up unassisted. Uses a cane. Antalgic, slightly stooped, not shuffling but small steps, difficulty with balance, wide based.   Motor Observation:    No asymmetry, no atrophy, and no involuntary movements noted. Tone:     Increased muscle tone, slight cogwheeling  Posture:    Posture is stooped    Strength: Mild triceps and hip flexion weakness. Otherwise strength is V/V in the upper and lower limbs.      Sensation: intact to LT     Reflex Exam:  DTR's: Brisk uppers, hypo patellars, absent AJs.    Toes:    The toes are downgoing bilaterally.   Clonus:    Clonus is absent.      Assessment/Plan:  82 year old male with hallucinations, memory loss, gait abnormality. Likely dementia, possibly lewy-body dementia. Discussed with family and they suspected dementia as well.   Snoring, sleeps all day, has OSA, no sleep test for years, not using a cpap, will refer to sleep studies MRI of the brain Discussed possibilities of dementia, at next appointment will review MRi of the brain and likely start Aricept or Exelon He has muscle weakness, gait abnormality, can further investigate at another appointment. No driving, he acknowledged and family understands   Naomie Dean, MD  [facility] [address] [place], [place] [address]  Phone [phone] Fax [phone]

## 2016-02-27 NOTE — Patient Instructions (Signed)
Remember to drink plenty of fluid, eat healthy meals and do not skip any meals. Try to eat protein with a every meal and eat a healthy snack such as fruit or nuts in between meals. Try to keep a regular sleep-wake schedule and try to exercise daily, particularly in the form of walking, 20-30 minutes a day, if you can.   As far as your medications are concerned, I would like to suggest  As far as diagnostic testing: MRI brain, sleep referral  I would like to see you back in 4 months, sooner if we need to. Please call us with any interim questions, concerns, problems, updates or refill requests.   Our phone number is [phone]. We also have an after hours call service for urgent matters and there is a physician on-call for urgent questions. For any emergencies you know to call 911 or go to the nearest emergency room

## 2016-02-28 DIAGNOSIS — F0390 Unspecified dementia without behavioral disturbance: Secondary | ICD-10-CM | POA: Insufficient documentation

## 2016-03-11 ENCOUNTER — Encounter (HOSPITAL_COMMUNITY): Payer: Self-pay | Admitting: Emergency Medicine

## 2016-03-11 ENCOUNTER — Emergency Department (HOSPITAL_COMMUNITY): Payer: Medicare Other

## 2016-03-11 ENCOUNTER — Inpatient Hospital Stay (HOSPITAL_COMMUNITY)
Admission: EM | Admit: 2016-03-11 | Discharge: 2016-03-15 | DRG: 291 | Disposition: A | Discharge status: Skilled Nursing Facility | Payer: Medicare Other | Attending: Internal Medicine | Admitting: Internal Medicine

## 2016-03-11 DIAGNOSIS — Z886 Allergy status to analgesic agent status: Secondary | ICD-10-CM

## 2016-03-11 DIAGNOSIS — Z8673 Personal history of transient ischemic attack (TIA), and cerebral infarction without residual deficits: Secondary | ICD-10-CM | POA: Diagnosis not present

## 2016-03-11 DIAGNOSIS — I5043 Acute on chronic combined systolic (congestive) and diastolic (congestive) heart failure: Secondary | ICD-10-CM | POA: Diagnosis present

## 2016-03-11 DIAGNOSIS — R32 Unspecified urinary incontinence: Secondary | ICD-10-CM | POA: Diagnosis present

## 2016-03-11 DIAGNOSIS — J9601 Acute respiratory failure with hypoxia: Secondary | ICD-10-CM | POA: Diagnosis present

## 2016-03-11 DIAGNOSIS — K5909 Other constipation: Secondary | ICD-10-CM | POA: Diagnosis present

## 2016-03-11 DIAGNOSIS — I5023 Acute on chronic systolic (congestive) heart failure: Secondary | ICD-10-CM | POA: Diagnosis not present

## 2016-03-11 DIAGNOSIS — Z96652 Presence of left artificial knee joint: Secondary | ICD-10-CM | POA: Diagnosis present

## 2016-03-11 DIAGNOSIS — R5381 Other malaise: Secondary | ICD-10-CM | POA: Diagnosis present

## 2016-03-11 DIAGNOSIS — R0600 Dyspnea, unspecified: Secondary | ICD-10-CM | POA: Diagnosis not present

## 2016-03-11 DIAGNOSIS — K219 Gastro-esophageal reflux disease without esophagitis: Secondary | ICD-10-CM | POA: Diagnosis present

## 2016-03-11 DIAGNOSIS — Z7984 Long term (current) use of oral hypoglycemic drugs: Secondary | ICD-10-CM

## 2016-03-11 DIAGNOSIS — E1159 Type 2 diabetes mellitus with other circulatory complications: Secondary | ICD-10-CM | POA: Diagnosis present

## 2016-03-11 DIAGNOSIS — F0390 Unspecified dementia without behavioral disturbance: Secondary | ICD-10-CM | POA: Diagnosis present

## 2016-03-11 DIAGNOSIS — I272 Other secondary pulmonary hypertension: Secondary | ICD-10-CM | POA: Diagnosis present

## 2016-03-11 DIAGNOSIS — E118 Type 2 diabetes mellitus with unspecified complications: Secondary | ICD-10-CM | POA: Diagnosis not present

## 2016-03-11 DIAGNOSIS — Z7902 Long term (current) use of antithrombotics/antiplatelets: Secondary | ICD-10-CM

## 2016-03-11 DIAGNOSIS — W19XXXA Unspecified fall, initial encounter: Secondary | ICD-10-CM | POA: Diagnosis present

## 2016-03-11 DIAGNOSIS — D649 Anemia, unspecified: Secondary | ICD-10-CM | POA: Diagnosis present

## 2016-03-11 DIAGNOSIS — Z8249 Family history of ischemic heart disease and other diseases of the circulatory system: Secondary | ICD-10-CM

## 2016-03-11 DIAGNOSIS — J9600 Acute respiratory failure, unspecified whether with hypoxia or hypercapnia: Secondary | ICD-10-CM | POA: Insufficient documentation

## 2016-03-11 DIAGNOSIS — E6609 Other obesity due to excess calories: Secondary | ICD-10-CM | POA: Diagnosis present

## 2016-03-11 DIAGNOSIS — Z66 Do not resuscitate: Secondary | ICD-10-CM | POA: Diagnosis present

## 2016-03-11 DIAGNOSIS — I110 Hypertensive heart disease with heart failure: Principal | ICD-10-CM | POA: Diagnosis present

## 2016-03-11 DIAGNOSIS — R0989 Other specified symptoms and signs involving the circulatory and respiratory systems: Secondary | ICD-10-CM | POA: Diagnosis not present

## 2016-03-11 DIAGNOSIS — S41111A Laceration without foreign body of right upper arm, initial encounter: Secondary | ICD-10-CM | POA: Diagnosis present

## 2016-03-11 DIAGNOSIS — Z888 Allergy status to other drugs, medicaments and biological substances status: Secondary | ICD-10-CM

## 2016-03-11 DIAGNOSIS — E785 Hyperlipidemia, unspecified: Secondary | ICD-10-CM | POA: Diagnosis present

## 2016-03-11 DIAGNOSIS — R0602 Shortness of breath: Secondary | ICD-10-CM | POA: Diagnosis present

## 2016-03-11 DIAGNOSIS — I10 Essential (primary) hypertension: Secondary | ICD-10-CM | POA: Diagnosis not present

## 2016-03-11 DIAGNOSIS — T501X5A Adverse effect of loop [high-ceiling] diuretics, initial encounter: Secondary | ICD-10-CM | POA: Diagnosis not present

## 2016-03-11 DIAGNOSIS — I482 Chronic atrial fibrillation, unspecified: Secondary | ICD-10-CM | POA: Diagnosis present

## 2016-03-11 DIAGNOSIS — E876 Hypokalemia: Secondary | ICD-10-CM | POA: Diagnosis not present

## 2016-03-11 DIAGNOSIS — G9340 Encephalopathy, unspecified: Secondary | ICD-10-CM | POA: Diagnosis present

## 2016-03-11 DIAGNOSIS — Z6831 Body mass index (BMI) 31.0-31.9, adult: Secondary | ICD-10-CM

## 2016-03-11 DIAGNOSIS — Z87891 Personal history of nicotine dependence: Secondary | ICD-10-CM | POA: Diagnosis not present

## 2016-03-11 DIAGNOSIS — I480 Paroxysmal atrial fibrillation: Secondary | ICD-10-CM

## 2016-03-11 HISTORY — DX: Acute on chronic systolic (congestive) heart failure: I50.23

## 2016-03-11 HISTORY — DX: Unspecified dementia, unspecified severity, without behavioral disturbance, psychotic disturbance, mood disturbance, and anxiety: F03.90

## 2016-03-11 HISTORY — DX: Heart failure, unspecified: I50.9

## 2016-03-11 LAB — CBC WITH DIFFERENTIAL/PLATELET
Basophils Absolute: 0 10*3/uL (ref 0.0–0.1)
Basophils Relative: 0 %
Eosinophils Absolute: 0 10*3/uL (ref 0.0–0.7)
Eosinophils Relative: 0 %
HEMATOCRIT: 40.7 % (ref 39.0–52.0)
HEMOGLOBIN: 12.7 g/dL — AB (ref 13.0–17.0)
Lymphocytes Relative: 8 %
Lymphs Abs: 0.5 10*3/uL — ABNORMAL LOW (ref 0.7–4.0)
MCH: 31.5 pg (ref 26.0–34.0)
MCHC: 31.2 g/dL (ref 30.0–36.0)
MCV: 101 fL — ABNORMAL HIGH (ref 78.0–100.0)
MONOS PCT: 8 %
Monocytes Absolute: 0.5 10*3/uL (ref 0.1–1.0)
Neutro Abs: 5.6 10*3/uL (ref 1.7–7.7)
Neutrophils Relative %: 84 %
Platelets: 199 10*3/uL (ref 150–400)
RBC: 4.03 MIL/uL — ABNORMAL LOW (ref 4.22–5.81)
RDW: 14.1 % (ref 11.5–15.5)
WBC: 6.6 10*3/uL (ref 4.0–10.5)

## 2016-03-11 LAB — COMPREHENSIVE METABOLIC PANEL
ALBUMIN: 3.6 g/dL (ref 3.5–5.0)
ALK PHOS: 80 U/L (ref 38–126)
ALT: 16 U/L — AB (ref 17–63)
AST: 18 U/L (ref 15–41)
Anion gap: 8 (ref 5–15)
BILIRUBIN TOTAL: 1 mg/dL (ref 0.3–1.2)
BUN: 21 mg/dL — ABNORMAL HIGH (ref 6–20)
CALCIUM: 8.6 mg/dL — AB (ref 8.9–10.3)
CO2: 23 mmol/L (ref 22–32)
CREATININE: 1.12 mg/dL (ref 0.61–1.24)
Chloride: 109 mmol/L (ref 101–111)
GFR calc non Af Amer: 57 mL/min — ABNORMAL LOW (ref >60)
GLUCOSE: 143 mg/dL — AB (ref 65–99)
Potassium: 3.8 mmol/L (ref 3.5–5.1)
SODIUM: 140 mmol/L (ref 135–145)
TOTAL PROTEIN: 6 g/dL — AB (ref 6.5–8.1)

## 2016-03-11 LAB — URINALYSIS, ROUTINE W REFLEX MICROSCOPIC
GLUCOSE, UA: NEGATIVE mg/dL
HGB URINE DIPSTICK: NEGATIVE
Ketones, ur: 15 mg/dL — AB
Nitrite: NEGATIVE
Protein, ur: 100 mg/dL — AB
SPECIFIC GRAVITY, URINE: 1.025 (ref 1.005–1.030)
pH: 6 (ref 5.0–8.0)

## 2016-03-11 LAB — I-STAT ARTERIAL BLOOD GAS, ED
Acid-Base Excess: 2 mmol/L (ref 0.0–2.0)
Bicarbonate: 27 mEq/L — ABNORMAL HIGH (ref 20.0–24.0)
O2 Saturation: 95 %
PH ART: 7.402 (ref 7.350–7.450)
TCO2: 28 mmol/L (ref 0–100)
pCO2 arterial: 43.1 mmHg (ref 35.0–45.0)
pO2, Arterial: 72 mmHg — ABNORMAL LOW (ref 80.0–100.0)

## 2016-03-11 LAB — VITAMIN B12: VITAMIN B 12: 1035 pg/mL — AB (ref 180–914)

## 2016-03-11 LAB — MRSA PCR SCREENING: MRSA BY PCR: NEGATIVE

## 2016-03-11 LAB — GLUCOSE, CAPILLARY: Glucose-Capillary: 204 mg/dL — ABNORMAL HIGH (ref 65–99)

## 2016-03-11 LAB — TSH: TSH: 3.338 u[IU]/mL (ref 0.350–4.500)

## 2016-03-11 LAB — URINE MICROSCOPIC-ADD ON
RBC / HPF: NONE SEEN RBC/hpf (ref 0–5)
SQUAMOUS EPITHELIAL / LPF: NONE SEEN

## 2016-03-11 LAB — BRAIN NATRIURETIC PEPTIDE: B Natriuretic Peptide: 591.6 pg/mL — ABNORMAL HIGH (ref 0.0–100.0)

## 2016-03-11 MED ORDER — ACETAMINOPHEN 325 MG PO TABS: 650.0000 mg | ORAL_TABLET | ORAL | Status: DC | PRN

## 2016-03-11 MED ORDER — SODIUM CHLORIDE 0.9% FLUSH: 3.0000 mL | INTRAVENOUS | Status: DC | PRN

## 2016-03-11 MED ORDER — LISINOPRIL 10 MG PO TABS
10.0000 mg | ORAL_TABLET | Freq: Once | ORAL | Status: AC
  Administered 2016-03-11: 10 mg via ORAL
  Filled 2016-03-11: qty 1

## 2016-03-11 MED ORDER — ONDANSETRON HCL 4 MG/2ML IJ SOLN: 4.0000 mg | Freq: Four times a day (QID) | INTRAMUSCULAR | Status: DC | PRN

## 2016-03-11 MED ORDER — DILTIAZEM HCL ER 60 MG PO CP12
60.0000 mg | ORAL_CAPSULE | Freq: Two times a day (BID) | ORAL | Status: DC
  Administered 2016-03-11 – 2016-03-13 (×4): 60 mg via ORAL
  Filled 2016-03-11 (×4): qty 1

## 2016-03-11 MED ORDER — TIOTROPIUM BROMIDE MONOHYDRATE 18 MCG IN CAPS
18.0000 ug | ORAL_CAPSULE | Freq: Every day | RESPIRATORY_TRACT | Status: DC
  Administered 2016-03-12 – 2016-03-15 (×4): 18 ug via RESPIRATORY_TRACT
  Filled 2016-03-11 (×2): qty 5

## 2016-03-11 MED ORDER — ONDANSETRON HCL 4 MG PO TABS: 4.0000 mg | ORAL_TABLET | Freq: Four times a day (QID) | ORAL | Status: DC | PRN

## 2016-03-11 MED ORDER — FUROSEMIDE 10 MG/ML IJ SOLN: 20.0000 mg | Freq: Two times a day (BID) | INTRAMUSCULAR | Status: DC

## 2016-03-11 MED ORDER — NITROGLYCERIN 2 % TD OINT
0.5000 [in_us] | TOPICAL_OINTMENT | Freq: Once | TRANSDERMAL | Status: AC
  Administered 2016-03-11: 0.5 [in_us] via TOPICAL
  Filled 2016-03-11: qty 1

## 2016-03-11 MED ORDER — POTASSIUM CHLORIDE CRYS ER 20 MEQ PO TBCR
40.0000 meq | EXTENDED_RELEASE_TABLET | Freq: Every day | ORAL | Status: DC
  Administered 2016-03-11 – 2016-03-15 (×5): 40 meq via ORAL
  Filled 2016-03-11 (×7): qty 2

## 2016-03-11 MED ORDER — SODIUM CHLORIDE 0.9% FLUSH: 3.0000 mL | Freq: Two times a day (BID) | INTRAVENOUS | Status: DC

## 2016-03-11 MED ORDER — SODIUM CHLORIDE 0.9 % IV SOLN: 250.0000 mL | INTRAVENOUS | Status: DC | PRN

## 2016-03-11 MED ORDER — ACETAMINOPHEN 325 MG PO TABS
650.0000 mg | ORAL_TABLET | Freq: Four times a day (QID) | ORAL | Status: DC | PRN
  Administered 2016-03-11 – 2016-03-12 (×3): 650 mg via ORAL
  Filled 2016-03-11 (×3): qty 2

## 2016-03-11 MED ORDER — SODIUM CHLORIDE 0.9% FLUSH
3.0000 mL | Freq: Two times a day (BID) | INTRAVENOUS | Status: DC
  Administered 2016-03-11 – 2016-03-15 (×8): 3 mL via INTRAVENOUS

## 2016-03-11 MED ORDER — CLOPIDOGREL BISULFATE 75 MG PO TABS
75.0000 mg | ORAL_TABLET | Freq: Every day | ORAL | Status: DC
  Administered 2016-03-12 – 2016-03-15 (×4): 75 mg via ORAL
  Filled 2016-03-11 (×4): qty 1

## 2016-03-11 MED ORDER — LISINOPRIL 40 MG PO TABS
40.0000 mg | ORAL_TABLET | Freq: Every morning | ORAL | Status: DC
  Administered 2016-03-12 – 2016-03-15 (×4): 40 mg via ORAL
  Filled 2016-03-11: qty 1
  Filled 2016-03-11: qty 2
  Filled 2016-03-11: qty 1
  Filled 2016-03-11: qty 2

## 2016-03-11 MED ORDER — HYDRALAZINE HCL 20 MG/ML IJ SOLN
5.0000 mg | INTRAMUSCULAR | Status: DC | PRN
  Administered 2016-03-11: 5 mg via INTRAVENOUS
  Filled 2016-03-11 (×2): qty 1

## 2016-03-11 MED ORDER — FUROSEMIDE 10 MG/ML IJ SOLN
60.0000 mg | Freq: Once | INTRAMUSCULAR | Status: AC
  Administered 2016-03-11: 60 mg via INTRAVENOUS
  Filled 2016-03-11: qty 6

## 2016-03-11 MED ORDER — ACETAMINOPHEN 650 MG RE SUPP: 650.0000 mg | Freq: Four times a day (QID) | RECTAL | Status: DC | PRN

## 2016-03-11 MED ORDER — ATORVASTATIN CALCIUM 40 MG PO TABS
40.0000 mg | ORAL_TABLET | Freq: Every day | ORAL | Status: DC
  Administered 2016-03-12 – 2016-03-15 (×4): 40 mg via ORAL
  Filled 2016-03-11 (×4): qty 1

## 2016-03-11 MED ORDER — FUROSEMIDE 10 MG/ML IJ SOLN
40.0000 mg | Freq: Two times a day (BID) | INTRAMUSCULAR | Status: DC
  Administered 2016-03-11 – 2016-03-12 (×3): 40 mg via INTRAVENOUS
  Filled 2016-03-11 (×3): qty 4

## 2016-03-11 MED ORDER — INSULIN ASPART 100 UNIT/ML ~~LOC~~ SOLN
0.0000 [IU] | Freq: Three times a day (TID) | SUBCUTANEOUS | Status: DC
  Administered 2016-03-12 (×2): 1 [IU] via SUBCUTANEOUS
  Administered 2016-03-12: 2 [IU] via SUBCUTANEOUS
  Administered 2016-03-13: 1 [IU] via SUBCUTANEOUS
  Administered 2016-03-13: 2 [IU] via SUBCUTANEOUS
  Administered 2016-03-14: 1 [IU] via SUBCUTANEOUS
  Administered 2016-03-14: 2 [IU] via SUBCUTANEOUS
  Administered 2016-03-15 (×2): 1 [IU] via SUBCUTANEOUS
  Administered 2016-03-15: 2 [IU] via SUBCUTANEOUS

## 2016-03-11 NOTE — ED Notes (Signed)
Attempted report 

## 2016-03-11 NOTE — ED Provider Notes (Signed)
[facility]-EMERGENCY DEPT Provider Note   CSN: [id number] Arrival date & time: 03/11/16  0709  First Provider Contact:  None       History   Chief Complaint Chief Complaint  Patient presents with  . Fall    HPI Omar Chan is a 82 y.o. male.  HPI Patient presents after being found on the ground outside of his house. Patient has dementia, but has some ability to recall events. History obtained by EMS providers, the patient's daughter, and the patient. Seemingly, the patient was last seen a proximal 7 hours ago, by his wife. Just prior to arrival the patient was found by his wife, outside, in the carport, stuck between the car door and the edge of the building. The patient self denies any pain, any dyspnea, any nausea, any weakness. EMS reports the patient was tachypneic and hypoxic on arrival, though this improved with supplemental oxygen. Patient denies recent health changes. Family reports the patient is being evaluated for dementia, has MRI scheduled for tomorrow. EMS also reports that the patient had substantial bleeding from a right arm skin tear. Past Medical History:  Diagnosis Date  . Dementia   . Diabetes mellitus   . Exogenous obesity   . GERD (gastroesophageal reflux disease)   . Hiatal hernia   . Hyperlipidemia   . Hypertension   . Microcytic anemia   . Osteoarthritis   . Paroxysmal atrial fibrillation [facility](HCC)    cardioversion 01/25/10    Patient Active Problem List   Diagnosis Date Noted  . Dementia 02/28/2016  . Bilateral carotid bruits 03/08/2015  . Atrial fibrillation (HCC) 06/03/2014  . Symptomatic anemia 05/31/2014  . Anemia 05/25/2014  . GI bleed 05/25/2014  . Warfarin-induced coagulopathy (HCC) 05/25/2014  . Paroxysmal a-fib (HCC) 05/25/2014  . Peripheral edema 04/19/2014  . Dyslipidemia 10/08/2013  . Bradycardia 09/12/2013  . Edema 12/03/2010  . A-fib (HCC)   . Diabetes mellitus (HCC)   . Exogenous obesity   . Hypertension     Past  Surgical History:  Procedure Laterality Date  . CARDIOVASCULAR STRESS TEST  2007   No ischemia. EF 61%  . CARDIOVERSION  01/25/10  . CARDIOVERSION N/A 08/19/2013   Procedure: CARDIOVERSION;  Surgeon: Cassell Clementhomas Brackbill, MD;  [facility];  Service: Cardiovascular;  Laterality: N/A;  . ESOPHAGOGASTRODUODENOSCOPY N/A 06/02/2014   Procedure: ESOPHAGOGASTRODUODENOSCOPY (EGD);  Surgeon: Barrie FolkJohn C Hayes, MD;  [facility];  Service: Endoscopy;  Laterality: N/A;  . TOTAL KNEE ARTHROPLASTY     B  . US ECHOCARDIOGRAPHY  February 2011   Normal EF; LAE and RVE       Home Medications    Prior to Admission medications   Medication Sig Start Date End Date Taking? Authorizing Provider  acetaminophen (TYLENOL) 500 MG tablet Take 1,000 mg by mouth every 6 (six) hours as needed. For pain    Historical Provider, MD  atorvastatin (LIPITOR) 40 MG tablet Take 40 mg by mouth daily. 08/19/15   Historical Provider, MD  clopidogrel (PLAVIX) 75 MG tablet Take 1 tablet (75 mg total) by mouth daily. 12/14/15   Jake BatheMark C Skains, MD  furosemide (LASIX) 40 MG tablet Take 40 mg by mouth daily.    Historical Provider, MD  glimepiride (AMARYL) 1 MG tablet Take 1 mg by mouth daily. 08/19/15   Historical Provider, MD  lisinopril (PRINIVIL,ZESTRIL) 40 MG tablet Take 40 mg by mouth every morning.     Historical Provider, MD  pantoprazole (PROTONIX) 40 MG tablet Take 40 mg  by mouth 2 (two) times daily.    Historical Provider, MD  tiotropium (SPIRIVA) 18 MCG inhalation capsule Place 18 mcg into inhaler and inhale daily.    Historical Provider, MD    Family History Family History  Problem Relation Age of Onset  . Cancer Mother     deceased  . Hypertension Mother   . Pneumonia Father     deceased  . ALS Brother     deceased  . Cancer Brother     deceased/bone ca  . Cancer Sister     deceased  . Heart attack Neg Hx   . Stroke Neg Hx     Social History Social History  Substance Use Topics  . Smoking  status: Former Smoker    Quit date: 07/29/1981  . Smokeless tobacco: Never Used  . Alcohol use No     Allergies   Aspirin and Trazodone and nefazodone   Review of Systems Review of Systems  Unable to perform ROS: Dementia  Though much of the ROS seems accurate via initial evaluation   Physical Exam Updated Vital Signs BP (!) 160/106 (BP Location: Left Arm)   Pulse 117   Temp 99.1 F (37.3 C) (Oral)   Resp 24   SpO2 95%   Physical Exam  Constitutional: He appears well-developed. No distress.  Large elderly M sitting upright w towel for neck immobilization.  Speaking clearly, in NAD  HENT:  Head: Normocephalic and atraumatic.  Eyes: Conjunctivae and EOM are normal.  Cardiovascular: Normal rate and regular rhythm.   Pulmonary/Chest: No stridor. He has decreased breath sounds.  Abdominal: He exhibits no distension.  Musculoskeletal: He exhibits no edema.  Pitting edema, 1+, both LE  Neurological: He is alert. He displays no atrophy and no tremor. He exhibits normal muscle tone. He displays no seizure activity.  Skin: Skin is warm and dry. He is not diaphoretic.     Psychiatric: He has a normal mood and affect.  Nursing note and vitals reviewed.    ED Treatments / Results  Labs (all labs ordered are listed, but only abnormal results are displayed) Labs Reviewed  COMPREHENSIVE METABOLIC PANEL - Abnormal; Notable for the following:       Result Value   Glucose, Bld 143 (*)    BUN 21 (*)    Calcium 8.6 (*)    Total Protein 6.0 (*)    ALT 16 (*)    GFR calc non Af Amer 57 (*)    All other components within normal limits  CBC WITH DIFFERENTIAL/PLATELET - Abnormal; Notable for the following:    RBC 4.03 (*)    Hemoglobin 12.7 (*)    MCV 101.0 (*)    Lymphs Abs 0.5 (*)    All other components within normal limits  BRAIN NATRIURETIC PEPTIDE - Abnormal; Notable for the following:    B Natriuretic Peptide 591.6 (*)    All other components within normal limits    URINALYSIS, ROUTINE W REFLEX MICROSCOPIC (NOT AT [place]) - Abnormal; Notable for the following:    Bilirubin Urine SMALL (*)    Ketones, ur 15 (*)    Protein, ur 100 (*)    Leukocytes, UA SMALL (*)    All other components within normal limits  URINE MICROSCOPIC-ADD ON - Abnormal; Notable for the following:    Bacteria, UA RARE (*)    Casts GRANULAR CAST (*)    All other components within normal limits    EKG  EKG Interpretation  Date/Time:  Monday March 11 2016 08:14:56 EDT Ventricular Rate:  113 PR Interval:    QRS Duration: 115 QT Interval:  349 QTC Calculation: 485 R Axis:   -121 Text Interpretation:  Atrial fibrillation Baseline wander T wave abnormality Abnormal ekg Confirmed by Gerhard MunchLOCKWOOD, Nabria Nevin  MD ([id number]) on 03/11/2016 9:33:16 AM       Radiology Dg Chest 2 View  Result Date: 03/11/2016 CLINICAL DATA:  Fall. EXAM: CHEST  2 VIEW COMPARISON:  09/02/2013 . FINDINGS: Cardiomegaly with pulmonary vascular prominence and bilateral interstitial prominence consistent congestive heart failure. Bilateral pleural effusions. No pneumothorax . IMPRESSION: Congestive heart failure with pulmonary interstitial edema and bilateral pleural effusions. Electronically Signed   By: Maisie Fushomas  Register   On: 03/11/2016 07:48   Ct Head Wo Contrast  Result Date: 03/11/2016 CLINICAL DATA:  Recent fall with headaches and neck pain, initial encounter EXAM: CT HEAD WITHOUT CONTRAST CT CERVICAL SPINE WITHOUT CONTRAST TECHNIQUE: Multidetector CT imaging of the head and cervical spine was performed following the standard protocol without intravenous contrast. Multiplanar CT image reconstructions of the cervical spine were also generated. COMPARISON:  09/12/2013 FINDINGS: CT HEAD FINDINGS Bony calvarium is intact. Mild atrophic and ischemic changes are again seen. No findings to suggest acute hemorrhage, acute infarction or space-occupying mass lesion are noted for age CT CERVICAL SPINE FINDINGS Seven cervical  segments are well visualized. Vertebral body height is well maintained. Disc space narrowing is noted hole C6-7 with osteophytic changes from C5 to T2. No acute fracture or acute facet abnormality is noted. Small pleural effusions are noted bilaterally. IMPRESSION: CT of the head: Chronic atrophic and ischemic changes. CT of the cervical spine: Degenerative changes without acute bony abnormality Bilateral pleural effusions. Electronically Signed   By: Alcide CleverMark  Lukens M.D.   On: 03/11/2016 09:22   Ct Cervical Spine Wo Contrast  Result Date: 03/11/2016 CLINICAL DATA:  Recent fall with headaches and neck pain, initial encounter EXAM: CT HEAD WITHOUT CONTRAST CT CERVICAL SPINE WITHOUT CONTRAST TECHNIQUE: Multidetector CT imaging of the head and cervical spine was performed following the standard protocol without intravenous contrast. Multiplanar CT image reconstructions of the cervical spine were also generated. COMPARISON:  09/12/2013 FINDINGS: CT HEAD FINDINGS Bony calvarium is intact. Mild atrophic and ischemic changes are again seen. No findings to suggest acute hemorrhage, acute infarction or space-occupying mass lesion are noted for age CT CERVICAL SPINE FINDINGS Seven cervical segments are well visualized. Vertebral body height is well maintained. Disc space narrowing is noted hole C6-7 with osteophytic changes from C5 to T2. No acute fracture or acute facet abnormality is noted. Small pleural effusions are noted bilaterally. IMPRESSION: CT of the head: Chronic atrophic and ischemic changes. CT of the cervical spine: Degenerative changes without acute bony abnormality Bilateral pleural effusions. Electronically Signed   By: Alcide CleverMark  Lukens M.D.   On: 03/11/2016 09:22   Dg Knee Complete 4 Views Left  Result Date: 03/11/2016 CLINICAL DATA:  False morning with knee pain, initial encounter EXAM: LEFT KNEE - COMPLETE 4+ VIEW COMPARISON:  None. FINDINGS: Knee prosthesis is noted. No acute fracture or loosening is  identified. No joint effusion or soft tissue abnormality is seen. IMPRESSION: Status post knee replacement.  No acute abnormality noted. Electronically Signed   By: Alcide CleverMark  Lukens M.D.   On: 03/11/2016 13:20    Procedures Procedures (including critical care time)  Medications Ordered in ED Medications  furosemide (LASIX) injection 60 mg (60 mg Intravenous Given 03/11/16 1132)  lisinopril (PRINIVIL,ZESTRIL) tablet 10 mg (10  mg Oral Given 03/11/16 1132)  nitroGLYCERIN (NITROGLYN) 2 % ointment 0.5 inch (0.5 inches Topical Given 03/11/16 1133)     Initial Impression / Assessment and Plan / ED Course  I have reviewed the triage vital signs and the nursing notes.  Pertinent labs & imaging results that were available during my care of the patient were reviewed by me and considered in my medical decision making (see chart for details).    On repeat exam after the initial evaluation, the patient is in no distress. Patient's mild hypoxia on room air, this improved with supplemental oxygen. Initial results discussed with patient and family members, including evidence for pulmonary congestion, and suspicion of symptomatic heart failure. Patient will receive IV Lasix.  On repeat exam, while attempting to stand, the patient describes left knee pain. It is unclear if this is new, since the fall, or a primary complaint, given the patient's history of dementia. X-ray ordered. On standing, the patient was tachypneic, tachycardic, uncomfortable appearing.  Patient remains hypoxic, though this improves with nasal cannula. Patient has received IV Lasix, nitroglycerin paste, with some decline in blood pressure.  X-ray of the knee unremarkable Final Clinical Impressions(s) / ED Diagnoses  Patient presents after being found outside of his house, after an unknown period of time. Here the patient is awake and alert, but is persistently tachycardic, tachypneic, and with hypoxia, there is concern for CHF  exacerbation. Patient does have some evidence for cutaneous lesions from the fall, but no evidence for fracture. Given the patient's hypertension, dyspnea, he received nitroglycerin, Lasix, required admission for further evaluation, management.   Gerhard Munch, MD 03/11/16 1349

## 2016-03-11 NOTE — ED Notes (Signed)
Report given to Susan RN 

## 2016-03-11 NOTE — ED Notes (Signed)
Patient transported to X-ray 

## 2016-03-11 NOTE — ED Notes (Signed)
Patient transported to CT 

## 2016-03-11 NOTE — ED Triage Notes (Signed)
Per EMS: pt from home found outside sitting beside car door; pt with dementia and unsure of when he went outside; pt with large skin tear to right arm; pt sts chronic back pain per norm; pt alert and oriented x 3 at present CBG 145

## 2016-03-11 NOTE — H&P (Signed)
History and Physical    Omar Chan:[id number] DOB: [birth year] DOA: 03/11/2016  PCP: Mickie Hillier, MD Patient coming from: home  Chief Complaint: DOE/ acute encephalopathy  HPI: Omar Chan is a very pleasant 82 y.o. male with medical history significant for chronic systolic heart failure A. fib on Coumadin hypertension, diabetes, dementia presents to the emergency department from home with the chief complaint of acute encephalopathy with worsening shortness of breath and lower extremity edema. Initial evaluation reveals acute respiratory failure likely related to CHF exacerbation setting of dementia.  Information is obtained from the patient and family members were at the bedside. Wife reports 2 week gradual worsening of shortness of breath and lower extremity edema. Last night he complained of worsening dyspnea with exertion. Wife reports he was given a sleep in the recliner. She got up this morning she found the patient and the car port on the ground between the car door and the wall of the house. Patient has no recollection of circumstances eating to this admission. EMS was called and reported tachypnea and hypoxia. Patient denies chest pain palpitations headache dizziness syncope or near-syncope. Not sure how he ended up on the ground. Wife reports gradual worsening of his dementia and outpatient MRI scheduled for tomorrow. No reports of any recent fever chills nausea vomiting diarrhea. Patient reports chronic constipation. He denies dysuria hematuria frequency or urgency. He does report lower extremity edema but does not feel it is any worse than usual.    ED Course: In the emergency department he is hyper tensive hypoxic on room air with tachypnea and tachycardia. He is provided with IV Lasix nitroglycerin paste. He is afebrile nontoxic appearing Review of Systems: As per HPI otherwise 10 point review of systems negative.   Ambulatory Status: Ambulates with a somewhat steady  gait no recent falls  Past Medical History:  Diagnosis Date  . Acute on chronic systolic CHF (congestive heart failure) (HCC)   . CHF (congestive heart failure) (HCC)   . Dementia   . Diabetes mellitus   . Exogenous obesity   . GERD (gastroesophageal reflux disease)   . Hiatal hernia   . Hyperlipidemia   . Hypertension   . Microcytic anemia   . Osteoarthritis   . Paroxysmal atrial fibrillation (HCC)    cardioversion 01/25/10    Past Surgical History:  Procedure Laterality Date  . CARDIOVASCULAR STRESS TEST  2007   No ischemia. EF 61%  . CARDIOVERSION  01/25/10  . CARDIOVERSION N/A 08/19/2013   Procedure: CARDIOVERSION;  Surgeon: Cassell Clement, MD;  Location: [facility] ENDOSCOPY;  Service: Cardiovascular;  Laterality: N/A;  . ESOPHAGOGASTRODUODENOSCOPY N/A 06/02/2014   Procedure: ESOPHAGOGASTRODUODENOSCOPY (EGD);  Surgeon: Barrie Folk, MD;  Location: [facility] ENDOSCOPY;  Service: Endoscopy;  Laterality: N/A;  . TOTAL KNEE ARTHROPLASTY     B  . US ECHOCARDIOGRAPHY  February 2011   Normal EF; LAE and RVE    Social History   Social History  . Marital status: Married    Spouse name: Omar Chan  . Number of children: 4  . Years of education: 12   Occupational History  . Retired    Social History Main Topics  . Smoking status: Former Smoker    Quit date: 07/29/1981  . Smokeless tobacco: Never Used  . Alcohol use No  . Drug use: No  . Sexual activity: Yes   Other Topics Concern  . Not on file   Social History Narrative   Lives with wife and daughter  Caffeine use: decaf coffee- 2-3 per day    Allergies  Allergen Reactions  . Aspirin Hives  . Trazodone And Nefazodone Other (See Comments)    "hyperactivity"    Family History  Problem Relation Age of Onset  . Cancer Mother     deceased  . Hypertension Mother   . Pneumonia Father     deceased  . ALS Brother     deceased  . Cancer Brother     deceased/bone ca  . Cancer Sister     deceased  . Heart attack Neg Hx   .  Stroke Neg Hx     Prior to Admission medications   Medication Sig Start Date End Date Taking? Authorizing Provider  acetaminophen (TYLENOL) 500 MG tablet Take 1,000 mg by mouth every 6 (six) hours as needed. For pain   Yes Historical Provider, MD  atorvastatin (LIPITOR) 40 MG tablet Take 40 mg by mouth daily. 08/19/15  Yes Historical Provider, MD  clopidogrel (PLAVIX) 75 MG tablet Take 1 tablet (75 mg total) by mouth daily. 12/14/15  Yes [name], MD  furosemide (LASIX) 40 MG tablet Take 40 mg by mouth daily.   Yes Historical Provider, MD  glimepiride (AMARYL) 1 MG tablet Take 1 mg by mouth daily. 08/19/15  Yes Historical Provider, MD  lisinopril (PRINIVIL,ZESTRIL) 40 MG tablet Take 40 mg by mouth every morning.    Yes Historical Provider, MD  pantoprazole (PROTONIX) 40 MG tablet Take 40 mg by mouth 2 (two) times daily.   Yes Historical Provider, MD  tiotropium (SPIRIVA) 18 MCG inhalation capsule Place 18 mcg into inhaler and inhale daily.   Yes Historical Provider, MD    Physical Exam: Vitals:   03/11/16 1330 03/11/16 1434 03/11/16 1500 03/11/16 1530  BP: 141/82 (!) 155/113 158/98 (!) 134/107  Pulse: (!) 55 119 119 119  Resp: 19 (!) 33 25 (!) 36  Temp:      TempSrc:      SpO2: 98% 98% 99% 95%     General:  Appears Obese called cooperative face is a bit puffy no acute distress Eyes:  PERRL, EOMI, normal lids, iris ENT:  grossly normal hearing, lips & tongue, mucous membranes of his mouth are pink slightly dry Neck:  no LAD, masses or thyromegaly Cardiovascular:  Tachycardic irregularly irregular no murmur no gallop no rub 2+ lower extremity Respiratory:  Mild increased work of breathing at rest using abdominal accessory muscles breath sounds diminished with faint crackles no wheezing Abdomen:  soft, ntnd, obese sluggish bowel sounds no guarding Skin:   Skin tear right forearm.no rash or induration seen on limited exam Musculoskeletal:  grossly normal tone BUE/BLE, good ROM, no  bony abnormality Psychiatric:  grossly normal mood and affect, speech fluent and appropriate, AOx3 Neurologic:  CN 2-12 grossly intact, moves all extremities in coordinated fashion, sensation intact  Labs on Admission: I have personally reviewed following labs and imaging studies  CBC:  Recent Labs Lab 03/11/16 0757  WBC 6.6  NEUTROABS 5.6  HGB 12.7*  HCT 40.7  MCV 101.0*  PLT 199   Basic Metabolic Panel:  Recent Labs Lab 03/11/16 0757  NA 140  K 3.8  CL 109  CO2 23  GLUCOSE 143*  BUN 21*  CREATININE 1.12  CALCIUM 8.6*   GFR: Estimated Creatinine Clearance: 59.1 mL/min (by C-G formula based on SCr of 1.12 mg/dL). Liver Function Tests:  Recent Labs Lab 03/11/16 0757  AST 18  ALT 16*  ALKPHOS 80  BILITOT 1.0  PROT 6.0*  ALBUMIN 3.6   No results for input(s): LIPASE, AMYLASE in the last 168 hours. No results for input(s): AMMONIA in the last 168 hours. Coagulation Profile: No results for input(s): INR, PROTIME in the last 168 hours. Cardiac Enzymes: No results for input(s): CKTOTAL, CKMB, CKMBINDEX, TROPONINI in the last 168 hours. BNP (last 3 results) No results for input(s): PROBNP in the last 8760 hours. HbA1C: No results for input(s): HGBA1C in the last 72 hours. CBG: No results for input(s): GLUCAP in the last 168 hours. Lipid Profile: No results for input(s): CHOL, HDL, LDLCALC, TRIG, CHOLHDL, LDLDIRECT in the last 72 hours. Thyroid Function Tests: No results for input(s): TSH, T4TOTAL, FREET4, T3FREE, THYROIDAB in the last 72 hours. Anemia Panel: No results for input(s): VITAMINB12, FOLATE, FERRITIN, TIBC, IRON, RETICCTPCT in the last 72 hours. Urine analysis:    Component Value Date/Time   COLORURINE YELLOW 03/11/2016 1156   APPEARANCEUR CLEAR 03/11/2016 1156   LABSPEC 1.025 03/11/2016 1156   PHURINE 6.0 03/11/2016 1156   GLUCOSEU NEGATIVE 03/11/2016 1156   HGBUR NEGATIVE 03/11/2016 1156   BILIRUBINUR SMALL (A) 03/11/2016 1156   KETONESUR  15 (A) 03/11/2016 1156   PROTEINUR 100 (A) 03/11/2016 1156   UROBILINOGEN 0.2 09/12/2013 1801   NITRITE NEGATIVE 03/11/2016 1156   LEUKOCYTESUR SMALL (A) 03/11/2016 1156    Creatinine Clearance: Estimated Creatinine Clearance: 59.1 mL/min (by C-G formula based on SCr of 1.12 mg/dL).  Sepsis Labs: @LABRCNTIP (procalcitonin:4,lacticidven:4) )No results found for this or any previous visit (from the past 240 hour(s)).   Radiological Exams on Admission: Dg Chest 2 View  Result Date: 03/11/2016 CLINICAL DATA:  Fall. EXAM: CHEST  2 VIEW COMPARISON:  09/02/2013 . FINDINGS: Cardiomegaly with pulmonary vascular prominence and bilateral interstitial prominence consistent congestive heart failure. Bilateral pleural effusions. No pneumothorax . IMPRESSION: Congestive heart failure with pulmonary interstitial edema and bilateral pleural effusions. Electronically Signed   By: Maisie Fus  Register   On: 03/11/2016 07:48   Ct Head Wo Contrast  Result Date: 03/11/2016 CLINICAL DATA:  Recent fall with headaches and neck pain, initial encounter EXAM: CT HEAD WITHOUT CONTRAST CT CERVICAL SPINE WITHOUT CONTRAST TECHNIQUE: Multidetector CT imaging of the head and cervical spine was performed following the standard protocol without intravenous contrast. Multiplanar CT image reconstructions of the cervical spine were also generated. COMPARISON:  09/12/2013 FINDINGS: CT HEAD FINDINGS Bony calvarium is intact. Mild atrophic and ischemic changes are again seen. No findings to suggest acute hemorrhage, acute infarction or space-occupying mass lesion are noted for age CT CERVICAL SPINE FINDINGS Seven cervical segments are well visualized. Vertebral body height is well maintained. Disc space narrowing is noted hole C6-7 with osteophytic changes from C5 to T2. No acute fracture or acute facet abnormality is noted. Small pleural effusions are noted bilaterally. IMPRESSION: CT of the head: Chronic atrophic and ischemic changes. CT  of the cervical spine: Degenerative changes without acute bony abnormality Bilateral pleural effusions. Electronically Signed   By: Alcide Clever M.D.   On: 03/11/2016 09:22   Ct Cervical Spine Wo Contrast  Result Date: 03/11/2016 CLINICAL DATA:  Recent fall with headaches and neck pain, initial encounter EXAM: CT HEAD WITHOUT CONTRAST CT CERVICAL SPINE WITHOUT CONTRAST TECHNIQUE: Multidetector CT imaging of the head and cervical spine was performed following the standard protocol without intravenous contrast. Multiplanar CT image reconstructions of the cervical spine were also generated. COMPARISON:  09/12/2013 FINDINGS: CT HEAD FINDINGS Bony calvarium is intact. Mild atrophic and ischemic changes  are again seen. No findings to suggest acute hemorrhage, acute infarction or space-occupying mass lesion are noted for age CT CERVICAL SPINE FINDINGS Seven cervical segments are well visualized. Vertebral body height is well maintained. Disc space narrowing is noted hole C6-7 with osteophytic changes from C5 to T2. No acute fracture or acute facet abnormality is noted. Small pleural effusions are noted bilaterally. IMPRESSION: CT of the head: Chronic atrophic and ischemic changes. CT of the cervical spine: Degenerative changes without acute bony abnormality Bilateral pleural effusions. Electronically Signed   By: Alcide CleverMark  Lukens M.D.   On: 03/11/2016 09:22   Dg Knee Complete 4 Views Left  Result Date: 03/11/2016 CLINICAL DATA:  False morning with knee pain, initial encounter EXAM: LEFT KNEE - COMPLETE 4+ VIEW COMPARISON:  None. FINDINGS: Knee prosthesis is noted. No acute fracture or loosening is identified. No joint effusion or soft tissue abnormality is seen. IMPRESSION: Status post knee replacement.  No acute abnormality noted. Electronically Signed   By: Alcide CleverMark  Lukens M.D.   On: 03/11/2016 13:20    EKG: Independently reviewed. Atrial fibrillation Baseline wander T wave abnormality  Assessment/Plan Principal  Problem:   Acute respiratory failure with hypoxia (HCC) Active Problems:   A-fib (HCC)   Diabetes mellitus (HCC)   Hypertension   Dyslipidemia   Anemia   Dementia   Acute encephalopathy   Fall   #1. Acute respiratory failure with hypoxia likely related to acute on chronic systolic heart failure. Wife does admit patient missed 1 dose of Lasix yesterday. BNP elevated chest x-ray consistent with CHF and bilateral pleural effusions. Oxygen saturation level 89% on room air. He received 60 mg of Lasix IV in the emergency department. Oxygen saturation levels greater than 90% oxygen -Admit to telemetry -Continue oxygen supplementation -check ABG -IV lasix -Echocardiogram -Monitor intake and output -Obtain daily weights -Wean oxygen as able  #2. Acute on chronic systolic heart failure. Echo done in February of this year reveals an EF 35-40% with moderate global reduction in LV function. No medications include Lasix 40 mg daily, lisinopril. Chart review indicates Zaroxolyn stopped last year -IV Lasix as noted above -Continue lisinopril -Monitor intake and output -Obtain daily weights -2-D echo  #3. A. Fib. Poor control. On Plavix. chadvasc score 5. EKG as noted above. Chart review indicates last year experienced bradycardia that stabilized after discontinuation of beta blocker and diltiazem. He did not require a pacemaker. Not on aspirin due to history of chronic GI bleed -Monitor on telemetry -Continue plavix -will start cardizem with parameters -monitor  4. Hypertension. Home medications include lisinopril and Lasix. He has been hypertensive since presentation. -Resume lisinopril -IV Lasix as noted above -We'll start Cardizem with parameters  #5. Acute encephalopathy in the setting of dementia. Likely related to #1. CT of the head without acute abnormality. Patient does have an outpatient MRI scheduled for tomorrow. Improving on admission. -Obtain an ABG -check B12, folate RPR -If  no improvement consider other imaging  #6. Diabetes. Type II. Serum glucose 143 on admission Home medications include Amaryl. -Hold Amaryl for now -Obtain a hemoglobin A1c -Sliding scale for optimal control  #.7 Anemia. Mild. History of same. He is on Plavix. No signs of active bleeding -Monitor  #8. Fall. Likely related to above. Wife reports no recent falls. CT head as noted above. C-spine without acute abnormality. Left knee xray without fx -physical therapy eval tomorrow   DVT prophylaxis: scd  Code Status: dnr  Family Communication: wife at bedside  Disposition Plan:  home  Consults called: none  Admission status: inpatient    Gwenyth BenderBLACK,Arvella Massingale M MD [name]  If 7PM-7AM, please contact night-coverage [email or web address] Password [email or web address]  03/11/2016, 4:34 PM

## 2016-03-12 ENCOUNTER — Inpatient Hospital Stay (HOSPITAL_COMMUNITY): Payer: Medicare Other

## 2016-03-12 ENCOUNTER — Other Ambulatory Visit: Payer: Medicare Other

## 2016-03-12 DIAGNOSIS — R0600 Dyspnea, unspecified: Secondary | ICD-10-CM

## 2016-03-12 LAB — GLUCOSE, CAPILLARY
GLUCOSE-CAPILLARY: 134 mg/dL — AB (ref 65–99)
GLUCOSE-CAPILLARY: 136 mg/dL — AB (ref 65–99)
GLUCOSE-CAPILLARY: 152 mg/dL — AB (ref 65–99)
Glucose-Capillary: 115 mg/dL — ABNORMAL HIGH (ref 65–99)

## 2016-03-12 LAB — BASIC METABOLIC PANEL
ANION GAP: 9 (ref 5–15)
BUN: 16 mg/dL (ref 6–20)
CALCIUM: 8.7 mg/dL — AB (ref 8.9–10.3)
CO2: 29 mmol/L (ref 22–32)
CREATININE: 1 mg/dL (ref 0.61–1.24)
Chloride: 105 mmol/L (ref 101–111)
GFR calc Af Amer: >60 mL/min (ref >60)
GLUCOSE: 150 mg/dL — AB (ref 65–99)
Potassium: 3.3 mmol/L — ABNORMAL LOW (ref 3.5–5.1)
SODIUM: 143 mmol/L (ref 135–145)

## 2016-03-12 LAB — CBC
HCT: 40.2 % (ref 39.0–52.0)
Hemoglobin: 12.9 g/dL — ABNORMAL LOW (ref 13.0–17.0)
MCH: 31.5 pg (ref 26.0–34.0)
MCHC: 32.1 g/dL (ref 30.0–36.0)
MCV: 98.3 fL (ref 78.0–100.0)
PLATELETS: 229 10*3/uL (ref 150–400)
RBC: 4.09 MIL/uL — ABNORMAL LOW (ref 4.22–5.81)
RDW: 13.8 % (ref 11.5–15.5)
WBC: 9.3 10*3/uL (ref 4.0–10.5)

## 2016-03-12 LAB — ECHOCARDIOGRAM COMPLETE
Height: 72 in
Weight: 3859.2 [oz_av]

## 2016-03-12 MED ORDER — PERFLUTREN LIPID MICROSPHERE
1.0000 mL | INTRAVENOUS | Status: AC | PRN
  Administered 2016-03-12: 2 mL via INTRAVENOUS
  Filled 2016-03-12: qty 10

## 2016-03-12 MED ORDER — PERFLUTREN LIPID MICROSPHERE
INTRAVENOUS | Status: AC
  Administered 2016-03-12: 2 mL via INTRAVENOUS
  Filled 2016-03-12: qty 10

## 2016-03-12 NOTE — Progress Notes (Addendum)
Patient ID: Omar Chan, male   DOB: 1925-12-06, 82 y.o.   MRN: [id number]  PROGRESS NOTE    Omar Chan  WUJ:[id number] DOB: 1925-12-06 DOA: 03/11/2016  PCP: [name],[name], MD   Brief Narrative:  82 y.o. male with past medical history significant for chronic systolic heart failure, last 2-D echo in 08/2015 showed ejection fraction 35% with diffuse hypokinesis A. fib on Plavix, hypertension, dementia who [facility] with altered mental status with worsening shortness of breath at rest, fatigue and lower extremity edema. Patient was found to be hypoxic on the admission with oxygen saturation of 88% on room air but this has improved to 91-99% with nasal cannula oxygen support. Chest x-ray was significant for congestive heart failure with pulmonary interstitial edema and bilateral pleural effusions. CT head and cervical spine showed degenerative changes without acute intracranial findings. Patient was started on IV Lasix for acute decompensated CHF.   Assessment & Plan:   Principal Problem:   Acute respiratory failure with hypoxia (HCC) / acute on chronic combined systolic and diastolic congestive heart failure - Based on severity of symptoms at the time of the admission CHF likely NYHA grade 4 as pt have symptoms of shortness of breath and fatigue at rest - Last 2-D echo in February 2017 with ejection fraction of 35%. - BNP on this admission 591 - Awaiting 2 D ECHO results on this admission - Continue lasix 40 mg IV BID - Weight since admission: 109.3 kg --> 109.4 kg - Continue daily weight and replete electrolytes as needed  Active Problems:   A-fib (HCC) - CHADS vasc score 5 - Continue plavix - Rate controlled with Cardizem 60 mg Q 12 hours   Hypokalemia - Due to lasix - Supplemented   Acute encephalopathy / history of dementia without behavioral disturbance  - Altered mental status likely dementia - Stable this morning  Essential hypertension -  Continue diltiazem, lisinopril  Dyslipidemia associated with type 2 diabetes mellitus - Continue statin therapy   Diabetes mellitus without complications without long-term insulin use - Patient takes Amaryl at home but here in hospital he is on sliding scale insulin    DVT prophylaxis: SCD's bilaterally  Code Status: DNR/DNI Family Communication: no family at the bedside this am  Disposition Plan: home once volume status optimized    Consultants:   PT   Procedures:   2 D ECHO 03/11/2016 - results pending as of 03/12/16  Antimicrobials:   None    Subjective: No overnight events.   Objective: Vitals:   03/12/16 0559 03/12/16 0600 03/12/16 0815 03/12/16 1000  BP:  117/83 (!) 123/92   Pulse: (!) 123 (!) 122 (!) 122   Resp: (!) 26 (!) 26 (!) 29   Temp:   97.4 F (36.3 C)   TempSrc:   Oral   SpO2: 93% 93% 91% 91%  Weight: 109.4 kg (241 lb 3.2 oz)     Height: 6' (1.829 m)       Intake/Output Summary (Last 24 hours) at 03/12/16 1015 Last data filed at 03/12/16 0900  Gross per 24 hour  Intake              200 ml  Output             2375 ml  Net            -2175 ml   Filed Weights   03/11/16 2010 03/12/16 0559  Weight: 109.3 kg (241 lb) 109.4 kg (  241 lb 3.2 oz)    Examination:  General exam: Appears calm and comfortable  Respiratory system: Bibasilar crackles, no wheezing  Cardiovascular system: S1 & S2 heard, Rate controlled. Gastrointestinal system: Abdomen is softly distended. No organomegaly or masses felt. Normal bowel sounds heard. Central nervous system: Alert and oriented. No focal neurological deficits. Extremities: Symmetric 5 x 5 power. Skin: No rashes, lesions or ulcers Psychiatry: Judgement and insight appear normal. Mood & affect appropriate.   Data Reviewed: I have personally reviewed following labs and imaging studies  CBC:  Recent Labs Lab 03/11/16 0757 03/12/16 0228  WBC 6.6 9.3  NEUTROABS 5.6  --   HGB 12.7* 12.9*  HCT 40.7 40.2    MCV 101.0* 98.3  PLT 199 229   Basic Metabolic Panel:  Recent Labs Lab 03/11/16 0757 03/12/16 0228  NA 140 143  K 3.8 3.3*  CL 109 105  CO2 23 29  GLUCOSE 143* 150*  BUN 21* 16  CREATININE 1.12 1.00  CALCIUM 8.6* 8.7*   GFR: Estimated Creatinine Clearance: 65.2 mL/min (by C-G formula based on SCr of 1 mg/dL). Liver Function Tests:  Recent Labs Lab 03/11/16 0757  AST 18  ALT 16*  ALKPHOS 80  BILITOT 1.0  PROT 6.0*  ALBUMIN 3.6   No results for input(s): LIPASE, AMYLASE in the last 168 hours. No results for input(s): AMMONIA in the last 168 hours. Coagulation Profile: No results for input(s): INR, PROTIME in the last 168 hours. Cardiac Enzymes: No results for input(s): CKTOTAL, CKMB, CKMBINDEX, TROPONINI in the last 168 hours. BNP (last 3 results) No results for input(s): PROBNP in the last 8760 hours. HbA1C: No results for input(s): HGBA1C in the last 72 hours. CBG:  Recent Labs Lab 03/11/16 2134 03/12/16 0814  GLUCAP 204* 134*   Lipid Profile: No results for input(s): CHOL, HDL, LDLCALC, TRIG, CHOLHDL, LDLDIRECT in the last 72 hours. Thyroid Function Tests:  Recent Labs  03/11/16 1647  TSH 3.338   Anemia Panel:  Recent Labs  03/11/16 1647  VITAMINB12 1,035*   Urine analysis:    Component Value Date/Time   COLORURINE YELLOW 03/11/2016 1156   APPEARANCEUR CLEAR 03/11/2016 1156   LABSPEC 1.025 03/11/2016 1156   PHURINE 6.0 03/11/2016 1156   GLUCOSEU NEGATIVE 03/11/2016 1156   HGBUR NEGATIVE 03/11/2016 1156   BILIRUBINUR SMALL (A) 03/11/2016 1156   KETONESUR 15 (A) 03/11/2016 1156   PROTEINUR 100 (A) 03/11/2016 1156   UROBILINOGEN 0.2 09/12/2013 1801   NITRITE NEGATIVE 03/11/2016 1156   LEUKOCYTESUR SMALL (A) 03/11/2016 1156   Sepsis Labs: @LABRCNTIP (procalcitonin:4,lacticidven:4)   Recent Results (from the past 240 hour(s))  MRSA PCR Screening     Status: None   Collection Time: 03/11/16  8:03 PM  Result Value Ref Range Status    MRSA by PCR NEGATIVE NEGATIVE Final      Radiology Studies: Dg Chest 2 View Result Date: 03/11/2016 Congestive heart failure with pulmonary interstitial edema and bilateral pleural effusions.   Ct Head Wo Contrast Result Date: 03/11/2016  CT of the head: Chronic atrophic and ischemic changes. CT of the cervical spine: Degenerative changes without acute bony abnormality Bilateral pleural effusions. Electronically Signed   By: Alcide Clever M.D.   On: 03/11/2016 09:22   Ct Cervical Spine Wo Contrast Result Date: 03/11/2016 Chronic atrophic and ischemic changes. CT of the cervical spine: Degenerative changes without acute bony abnormality Bilateral pleural effusions.   Dg Knee Complete 4 Views Left Result Date: 03/11/2016 Status post knee replacement.  No acute abnormality noted.     Scheduled Meds: . atorvastatin  40 mg Oral Daily  . clopidogrel  75 mg Oral Daily  . diltiazem  60 mg Oral Q12H  . furosemide  40 mg Intravenous BID  . insulin aspart  0-9 Units Subcutaneous TID WC  . lisinopril  40 mg Oral q morning - 10a  . potassium chloride  40 mEq Oral Daily  . tiotropium  18 mcg Inhalation Daily   Continuous Infusions:    LOS: 1 day    Time spent: 25 minutes  Greater than 50% of the time spent on counseling and coordinating the care.   Manson PasseyEVINE, Harless Molinari, MD [name] Pager [phone]  If 7PM-7AM, please contact night-coverage [email or web address] Password [email or web address] 03/12/2016, 10:15 AM

## 2016-03-12 NOTE — Progress Notes (Signed)
PT Cancellation Note  Patient Details Name: Omar Chan MRN: [id number] DOB: 06-Feb-1926   Cancelled Treatment:    Reason Eval/Treat Not Completed: Medical issues which prohibited therapy (HR 125 resting comfortably in bed).  Will follow acutely.  Encarnacion ChuAshley Abashian PT, DPT  Pager: [phone] Phone: [phone] 03/12/2016, 2:56 PM

## 2016-03-12 NOTE — Progress Notes (Signed)
  Echocardiogram 2D Echocardiogram with Definity has been performed.  Leta JunglingCooper, Sevilla Murtagh M 03/12/2016, 11:07 AM

## 2016-03-13 DIAGNOSIS — I10 Essential (primary) hypertension: Secondary | ICD-10-CM

## 2016-03-13 DIAGNOSIS — G9340 Encephalopathy, unspecified: Secondary | ICD-10-CM

## 2016-03-13 DIAGNOSIS — E785 Hyperlipidemia, unspecified: Secondary | ICD-10-CM

## 2016-03-13 DIAGNOSIS — E118 Type 2 diabetes mellitus with unspecified complications: Secondary | ICD-10-CM

## 2016-03-13 DIAGNOSIS — F0390 Unspecified dementia without behavioral disturbance: Secondary | ICD-10-CM

## 2016-03-13 DIAGNOSIS — I480 Paroxysmal atrial fibrillation: Secondary | ICD-10-CM

## 2016-03-13 DIAGNOSIS — J9601 Acute respiratory failure with hypoxia: Secondary | ICD-10-CM

## 2016-03-13 DIAGNOSIS — I5023 Acute on chronic systolic (congestive) heart failure: Secondary | ICD-10-CM

## 2016-03-13 DIAGNOSIS — I482 Chronic atrial fibrillation: Secondary | ICD-10-CM

## 2016-03-13 LAB — FOLATE RBC
Folate, Hemolysate: 422.6 ng/mL
Folate, RBC: 1127 ng/mL (ref >498)
HEMATOCRIT: 37.5 % (ref 37.5–51.0)

## 2016-03-13 LAB — GLUCOSE, CAPILLARY
GLUCOSE-CAPILLARY: 134 mg/dL — AB (ref 65–99)
Glucose-Capillary: 109 mg/dL — ABNORMAL HIGH (ref 65–99)
Glucose-Capillary: 173 mg/dL — ABNORMAL HIGH (ref 65–99)
Glucose-Capillary: 149 mg/dL — ABNORMAL HIGH (ref 65–99)

## 2016-03-13 LAB — BASIC METABOLIC PANEL
ANION GAP: 8 (ref 5–15)
BUN: 17 mg/dL (ref 6–20)
CHLORIDE: 106 mmol/L (ref 101–111)
CO2: 30 mmol/L (ref 22–32)
Calcium: 8.6 mg/dL — ABNORMAL LOW (ref 8.9–10.3)
Creatinine, Ser: 0.99 mg/dL (ref 0.61–1.24)
GFR calc Af Amer: >60 mL/min (ref >60)
GFR calc non Af Amer: >60 mL/min (ref >60)
GLUCOSE: 105 mg/dL — AB (ref 65–99)
POTASSIUM: 3.5 mmol/L (ref 3.5–5.1)
Sodium: 144 mmol/L (ref 135–145)

## 2016-03-13 LAB — CBC
HEMATOCRIT: 40.5 % (ref 39.0–52.0)
HEMOGLOBIN: 12.7 g/dL — AB (ref 13.0–17.0)
MCH: 31.8 pg (ref 26.0–34.0)
MCHC: 31.4 g/dL (ref 30.0–36.0)
MCV: 101.3 fL — AB (ref 78.0–100.0)
Platelets: 204 10*3/uL (ref 150–400)
RBC: 4 MIL/uL — ABNORMAL LOW (ref 4.22–5.81)
RDW: 14.1 % (ref 11.5–15.5)
WBC: 7.6 10*3/uL (ref 4.0–10.5)

## 2016-03-13 MED ORDER — METOPROLOL SUCCINATE ER 25 MG PO TB24
25.0000 mg | ORAL_TABLET | Freq: Every day | ORAL | Status: DC
  Administered 2016-03-13 – 2016-03-15 (×3): 25 mg via ORAL
  Filled 2016-03-13 (×3): qty 1

## 2016-03-13 MED ORDER — SPIRONOLACTONE 25 MG PO TABS
12.5000 mg | ORAL_TABLET | Freq: Every day | ORAL | Status: DC
  Administered 2016-03-13 – 2016-03-15 (×3): 12.5 mg via ORAL
  Filled 2016-03-13 (×3): qty 1

## 2016-03-13 MED ORDER — FUROSEMIDE 10 MG/ML IJ SOLN
40.0000 mg | Freq: Three times a day (TID) | INTRAMUSCULAR | Status: DC
  Administered 2016-03-13 – 2016-03-14 (×3): 40 mg via INTRAVENOUS
  Filled 2016-03-13 (×3): qty 4

## 2016-03-13 MED ORDER — METOPROLOL TARTRATE 5 MG/5ML IV SOLN
5.0000 mg | Freq: Once | INTRAVENOUS | Status: AC
  Administered 2016-03-13: 5 mg via INTRAVENOUS
  Filled 2016-03-13: qty 5

## 2016-03-13 NOTE — Progress Notes (Signed)
03/13/2016 1530 Received pt to room 2W13 a transfer from 2H.  Pt is Alert and oriented x2, to self and place.  Tele monitor applied and CCMD notified.  Oriented pt and wife to room, call light and bed,  Call bell in reach and family at bedside. Kathryne HitchAllen, Aleks Nawrot C

## 2016-03-13 NOTE — Consult Note (Signed)
Cardiology Consult    Patient ID: Omar Chan MRN: [id number], DOB/AGE: [birth year]   Admit date: 03/11/2016 Date of Consult: 03/13/2016  Primary Physician: Mickie Hillier, MD Reason for Consult: CHF Primary Cardiologist: Dr. Anne Fu Requesting Provider: Dr. Isidoro Donning  Patient Profile  Mr. Omar Chan is a 82 year old male with a past medical history of chronic systolic CHF, DM, GERD, HLD, HTN, and PAF (not on anticoagulation due to falls and GI Bleed). He presented to the ED on 03/11/16 via EMS after being found outside sitting beside his car door. He was unsure how he got outside, there was also a large skin tear on his arm.   History of Present Illness  He was admitted for further evaluation. According to his wife, the patient has had 2 weeks of worsening shortness of breath and lower extremity edema. He has been sleeping in the recliner. BNP was 591.6, chest x ray showed interstitial edema consistent with CHF. He was hypoxic and required supplemental oxygen.   Echo was obtained and EF was 30-35% with diffuse hypokinesis, also had grade 2 diastolic dysfunction. PA pressure was increased at . EF was 35-40% in February of this year.   He also has a history of PAF, po diltiazem was started for rate control yesterday as he had elevated HR in 120's.   Patient is somnolent on exam. His wife tells me that he has been having trouble breathing at night at home. He sleeps in the recliner and she can sometimes hear him gasping for breath. Patient is confused baseline on exam. History was obtained from the wife and the chart.    Past Medical History   Past Medical History:  Diagnosis Date  . Acute on chronic systolic CHF (congestive heart failure) (HCC)   . CHF (congestive heart failure) (HCC)   . Dementia   . Diabetes mellitus   . Exogenous obesity   . GERD (gastroesophageal reflux disease)   . Hiatal hernia   . Hyperlipidemia   . Hypertension   . Microcytic anemia   . Osteoarthritis     . Paroxysmal atrial fibrillation (HCC)    cardioversion 01/25/10    Past Surgical History:  Procedure Laterality Date  . CARDIOVASCULAR STRESS TEST  2007   No ischemia. EF 61%  . CARDIOVERSION  01/25/10  . CARDIOVERSION N/A 08/19/2013   Procedure: CARDIOVERSION;  Surgeon: Cassell Clement, MD;  Location: [facility] ENDOSCOPY;  Service: Cardiovascular;  Laterality: N/A;  . ESOPHAGOGASTRODUODENOSCOPY N/A 06/02/2014   Procedure: ESOPHAGOGASTRODUODENOSCOPY (EGD);  Surgeon: Barrie Folk, MD;  Location: [facility] ENDOSCOPY;  Service: Endoscopy;  Laterality: N/A;  . TOTAL KNEE ARTHROPLASTY     B  . US ECHOCARDIOGRAPHY  February 2011   Normal EF; LAE and RVE     Allergies  Allergies  Allergen Reactions  . Aspirin Hives  . Trazodone And Nefazodone Other (See Comments)    "hyperactivity"    Inpatient Medications    . atorvastatin  40 mg Oral Daily  . clopidogrel  75 mg Oral Daily  . diltiazem  60 mg Oral Q12H  . furosemide  40 mg Intravenous Q8H  . insulin aspart  0-9 Units Subcutaneous TID WC  . lisinopril  40 mg Oral q morning - 10a  . potassium chloride  40 mEq Oral Daily  . sodium chloride flush  3 mL Intravenous Q12H  . tiotropium  18 mcg Inhalation Daily    Family History    Family History  Problem Relation Age of Onset  .  Cancer Mother     deceased  . Hypertension Mother   . Pneumonia Father     deceased  . ALS Brother     deceased  . Cancer Brother     deceased/bone ca  . Cancer Sister     deceased  . Heart attack Neg Hx   . Stroke Neg Hx     Social History    Social History   Social History  . Marital status: Married    Spouse name: Omar Chan  . Number of children: 4  . Years of education: 12   Occupational History  . Retired    Social History Main Topics  . Smoking status: Former Smoker    Quit date: 07/29/1981  . Smokeless tobacco: Never Used  . Alcohol use No  . Drug use: No  . Sexual activity: Yes   Other Topics Concern  . Not on file   Social History  Narrative   Lives with wife and daughter   Caffeine use: decaf coffee- 2-3 per day     Review of Systems    General:  No chills, fever, night sweats or weight changes.  Cardiovascular:  No chest pain, dyspnea on exertion, edema, orthopnea, palpitations, paroxysmal nocturnal dyspnea. Dermatological: No rash, lesions/masses Respiratory: No cough, dyspnea Urologic: No hematuria, dysuria Abdominal:   No nausea, vomiting, diarrhea, bright red blood per rectum, melena, or hematemesis Neurologic:  No visual changes, wkns, changes in mental status. All other systems reviewed and are otherwise negative except as noted above.  Physical Exam    Blood pressure (!) 131/97, pulse (!) 116, temperature 97.9 F (36.6 C), temperature source Oral, resp. rate 18, height 6' (1.829 m), weight 240 lb (108.9 kg), SpO2 98 %.  General: Pleasant, NAD Psych: Confused, has dementia Neuro:Moves all extremities spontaneously. HEENT: Normal  Neck: Supple without bruits. No JVD.  Lungs:  Resp regular and unlabored, Diminished throughout.  Heart: IRRR no s3, s4, or murmurs. Abdomen: Soft, non-tender, non-distended, BS + x 4.  Extremities: No clubbing, cyanosis. +2 pedal edema. DP/PT/Radials 2+ and equal bilaterally.  Labs    Lab Results  Component Value Date   WBC 7.6 03/13/2016   HGB 12.7 (L) 03/13/2016   HCT 40.5 03/13/2016   MCV 101.3 (H) 03/13/2016   PLT 204 03/13/2016    Radiology Studies    Dg Chest 2 View  Result Date: 03/11/2016 CLINICAL DATA:  Fall. EXAM: CHEST  2 VIEW COMPARISON:  09/02/2013 . FINDINGS: Cardiomegaly with pulmonary vascular prominence and bilateral interstitial prominence consistent congestive heart failure. Bilateral pleural effusions. No pneumothorax . IMPRESSION: Congestive heart failure with pulmonary interstitial edema and bilateral pleural effusions. Electronically Signed   By: Maisie Fus  Register   On: 03/11/2016 07:48   Ct Head Wo Contrast  Result Date:  03/11/2016 CLINICAL DATA:  Recent fall with headaches and neck pain, initial encounter EXAM: CT HEAD WITHOUT CONTRAST CT CERVICAL SPINE WITHOUT CONTRAST TECHNIQUE: Multidetector CT imaging of the head and cervical spine was performed following the standard protocol without intravenous contrast. Multiplanar CT image reconstructions of the cervical spine were also generated. COMPARISON:  09/12/2013 FINDINGS: CT HEAD FINDINGS Bony calvarium is intact. Mild atrophic and ischemic changes are again seen. No findings to suggest acute hemorrhage, acute infarction or space-occupying mass lesion are noted for age CT CERVICAL SPINE FINDINGS Seven cervical segments are well visualized. Vertebral body height is well maintained. Disc space narrowing is noted hole C6-7 with osteophytic changes from C5 to T2. No acute fracture  or acute facet abnormality is noted. Small pleural effusions are noted bilaterally. IMPRESSION: CT of the head: Chronic atrophic and ischemic changes. CT of the cervical spine: Degenerative changes without acute bony abnormality Bilateral pleural effusions. Electronically Signed   By: Alcide CleverMark  Lukens M.D.   On: 03/11/2016 09:22   Ct Cervical Spine Wo Contrast  Result Date: 03/11/2016 CLINICAL DATA:  Recent fall with headaches and neck pain, initial encounter EXAM: CT HEAD WITHOUT CONTRAST CT CERVICAL SPINE WITHOUT CONTRAST TECHNIQUE: Multidetector CT imaging of the head and cervical spine was performed following the standard protocol without intravenous contrast. Multiplanar CT image reconstructions of the cervical spine were also generated. COMPARISON:  09/12/2013 FINDINGS: CT HEAD FINDINGS Bony calvarium is intact. Mild atrophic and ischemic changes are again seen. No findings to suggest acute hemorrhage, acute infarction or space-occupying mass lesion are noted for age CT CERVICAL SPINE FINDINGS Seven cervical segments are well visualized. Vertebral body height is well maintained. Disc space narrowing is  noted hole C6-7 with osteophytic changes from C5 to T2. No acute fracture or acute facet abnormality is noted. Small pleural effusions are noted bilaterally. IMPRESSION: CT of the head: Chronic atrophic and ischemic changes. CT of the cervical spine: Degenerative changes without acute bony abnormality Bilateral pleural effusions. Electronically Signed   By: Alcide CleverMark  Lukens M.D.   On: 03/11/2016 09:22   Dg Knee Complete 4 Views Left  Result Date: 03/11/2016 CLINICAL DATA:  False morning with knee pain, initial encounter EXAM: LEFT KNEE - COMPLETE 4+ VIEW COMPARISON:  None. FINDINGS: Knee prosthesis is noted. No acute fracture or loosening is identified. No joint effusion or soft tissue abnormality is seen. IMPRESSION: Status post knee replacement.  No acute abnormality noted. Electronically Signed   By: Alcide CleverMark  Lukens M.D.   On: 03/11/2016 13:20    EKG & Cardiac Imaging    EKG: 2:1 Atrial flutter.   Echocardiogram: 03/12/16 Study Conclusions  - Left ventricle: The cavity size was normal. There was mild focal   basal hypertrophy of the septum. Systolic function was moderately   to severely reduced. The estimated ejection fraction was in the   range of 30% to 35%. Diffuse hypokinesis. Features are consistent   with a pseudonormal left ventricular filling pattern, with   concomitant abnormal relaxation and increased filling pressure   (grade 2 diastolic dysfunction). - Ventricular septum: The contour showed diastolic flattening. - Aortic valve: Trileaflet; moderately thickened, mildly calcified   leaflets. There was mild regurgitation. - Left atrium: The atrium was moderately dilated. - Right ventricle: The cavity size was mildly dilated. Wall   thickness was normal. Systolic function was mildly reduced. - Right atrium: The atrium was mildly dilated. - Pulmonary arteries: Systolic pressure was moderately increased.   PA peak pressure: 60 mm Hg (S). - Pericardium, extracardiac: A trivial  pericardial effusion was   identified circumferential to the heart. Features were not   consistent with tamponade physiology.  Impressions:  - Compared to the prior study, there has been no significant   interval change.   Assessment & Plan  1. Acute on chronic systolic CHF: Presents with elevated BNP and chest x ray consistent with CHF. Volume overloaded on exam, would continue IV diuresis. Renal function is stable. Inaccurate I &O's due to urinary incontinence, would advise inserting urinary catheter for more accurate I & O's.   Continue ACE-I. Add Spironolactone 25mg  qd.   2. PAF: Rate controlled currently, diltiazem 60mg  BID added on 03/11/16. Would favor using beta blocker  over calcium channel blocker for rate control with low EF. Would suggest Metoprolol XL 25mg  daily and titrate up as BP allows.   This patients CHA2DS2-VASc Score and unadjusted Ischemic Stroke Rate (% per year) is equal to 7.2 % stroke rate/year from a score of 5 Above score calculated as 1 point each if present [CHF, HTN, DM, Vascular=MI/PAD/Aortic Plaque, Age if 65-74, or Male], 2 points each if present [Age > 75, or Stroke/TIA/TE] No anticoagulation in setting of frequent falls, advanced age, and history of GI Bleed.    3. HTN: hypertensive at times, see discussion above.   Signed, Little IshikawaErin E Smith, NP 03/13/2016, 1:41 PM Pager: [phone]  Patient seen, examined. Available data reviewed. Agree with findings, assessment, and plan as outlined by Suzzette RighterErin Smith, NP. The patient is independently interviewed and examined. His wife is at the bedside. The patient has dementia and is not really able to provide any history. On exam he is an elderly, pleasant, obese gentleman in no distress. JVP is normal. Lungs are clear bilaterally. Heart is irregularly irregular without murmur or gallop. There is 2+ pretibial edema present. Abdomen is soft and nontender.  Agree with IV diuresis for volume overload and acute systolic  heart failure. Agree with adding Aldactone to his regimen. The patient is not a candidate in my opinion for oral anticoagulation for atrial fibrillation and it appears this is been determined in the past as well. He continues on clopidogrel with history of TIA. Will add metoprolol succinate for better heart rate control of his atrial fibrillation. Will follow with you.   Tonny BollmanMichael Firmin Belisle, M.D. 03/13/2016 4:23 PM

## 2016-03-13 NOTE — Evaluation (Signed)
Physical Therapy Evaluation Patient Details Name: Omar Chan MRN: [id number] DOB: 06-16-1926 Today's Date: 03/13/2016   History of Present Illness  Pt is a 82 y/o F who presented to the ED on 03/11/16 via EMS after being found outside sitting beside his car door. He was unsure how he got outside, there was also a large skin tear on his arm. CT of the head without acute abnormality.  C-spine without acute abnormality. Left knee xray without fracture.  He presents with acute respiratory failure with hypoxia related to mild CHF.  Pt's PMH includes CHF, dementia, paroxysmal a-fib, Bil TKA.    Clinical Impression  Pt admitted with above diagnosis. Pt currently with functional limitations due to the deficits listed below (see PT Problem List). PTA pt required assist from wife for ADLs and was ambulating household distances with use of cane with 5-6 falls in the past year, per wife.  Omar Chan very lethargic today, briefly opening eyes x1 only.  Participated in AAROM bed level exercises. Pt will benefit from skilled PT to increase their independence and safety with mobility to allow discharge to the venue listed below.      Follow Up Recommendations SNF;Supervision/Assistance - 24 hour    Equipment Recommendations  Other (comment) (TBD at next venue of care)    Recommendations for Other Services OT consult     Precautions / Restrictions Precautions Precautions: Fall Restrictions Weight Bearing Restrictions: No      Mobility  Bed Mobility               General bed mobility comments: Unable to assess at this time due to pt's lethargy  Transfers                    Ambulation/Gait                Stairs            Wheelchair Mobility    Modified Rankin (Stroke Patients Only)       Balance Overall balance assessment: Needs assistance                                           Pertinent Vitals/Pain Pain Assessment: Faces Faces Pain  Scale: Hurts even more Pain Location: generalized Pain Descriptors / Indicators: Grimacing Pain Intervention(s): Limited activity within patient's tolerance;Monitored during session;Repositioned    Home Living Family/patient expects to be discharged to:: Skilled nursing facility Living Arrangements: Spouse/significant other;Children Available Help at Discharge: Family;Available 24 hours/day Type of Home: House Home Access: Stairs to enter Entrance Stairs-Rails: None Entrance Stairs-Number of Steps: 2 (1+1) Home Layout: One level;Laundry or work area in basement Occupational psychologist(laundry in basement-daughter does Nurse, learning disabilitythe laundry) Home Equipment: Toilet riser;Shower seat;Cane - single point;Cane - quad;Walker - 2 wheels;Walker - 4 wheels      Prior Function Level of Independence: Needs assistance   Gait / Transfers Assistance Needed: Ambulates household distances with cane.  Wife reports 5-6 falls in the past year.  ADL's / Homemaking Assistance Needed: Wife assists with bathing, dressing and she does the cooking, cleaning.  Daughter does the driving.        Hand Dominance        Extremity/Trunk Assessment   Upper Extremity Assessment: Generalized weakness           Lower Extremity Assessment: Generalized weakness  Communication   Communication: Other (comment) (mumbling today due to lethargy)  Cognition Arousal/Alertness: Lethargic Behavior During Therapy: Flat affect Overall Cognitive Status: History of cognitive impairments - at baseline                      General Comments General comments (skin integrity, edema, etc.): Pt lethargic during session, only opening eyes briefly x1.  Wife reports that pt sleeps majority of day at home.    Exercises General Exercises - Upper Extremity Shoulder Flexion: AAROM;Both;5 reps;Supine Elbow Flexion: AROM;Both;5 reps;Supine General Exercises - Lower Extremity Ankle Circles/Pumps: AROM;Both;10 reps;Supine Heel Slides:  AAROM;Both;10 reps;Supine Hip ABduction/ADduction: AAROM;Both;10 reps;Supine Straight Leg Raises: AAROM;Both;10 reps;Supine      Assessment/Plan    PT Assessment Patient needs continued PT services  PT Diagnosis Difficulty walking;Generalized weakness   PT Problem List Decreased strength;Decreased activity tolerance;Decreased balance;Decreased mobility;Decreased cognition;Decreased knowledge of use of DME;Decreased safety awareness;Cardiopulmonary status limiting activity;Pain  PT Treatment Interventions DME instruction;Gait training;Functional mobility training;Therapeutic activities;Therapeutic exercise;Stair training;Balance training;Cognitive remediation;Patient/family education;Wheelchair mobility training   PT Goals (Current goals can be found in the Care Plan section) Acute Rehab PT Goals Patient Stated Goal: Pt unable.  Per wife, to go home. PT Goal Formulation: With family Time For Goal Achievement: 03/27/16 Potential to Achieve Goals: Fair    Frequency Min 2X/week   Barriers to discharge Inaccessible home environment Steps to enter home    Co-evaluation               End of Session Equipment Utilized During Treatment: Oxygen Activity Tolerance: Patient limited by lethargy Patient left: in bed;with call bell/phone within reach;with bed alarm set;with nursing/sitter in room;with SCD's reapplied;with family/visitor present Nurse Communication: Mobility status;Other (comment) (RN present at end of session)         Time: [phone] PT Time Calculation (min) (ACUTE ONLY): 17 min   Charges:   PT Evaluation $PT Eval Moderate Complexity: 1 Procedure     PT G Codes:       Encarnacion ChuAshley Abashian PT, DPT  Pager: [phone] Phone: [phone] 03/13/2016, 3:51 PM

## 2016-03-13 NOTE — Care Management Note (Addendum)
Patient is from home with wife, he was found down by wife , here with acute resp failure, chf and afib, getting lasix iv bid and heart meds, he has a walker , a cane, and a elevated toilet seat at home.  He is not on oxygen at home.  His PCP is Dr. Catha GosselinKevin Little, he has insurance for medications.  Wife states she has had [facility] in the past for Home Health services and if they need [facility] services she would like to have [facility].  Await pt eval.  NCM will cont to follow for dc needs.

## 2016-03-13 NOTE — Progress Notes (Signed)
[facility]                                                                              Patient Demographics  Omar Chan, is a 82 y.o. male, DOB - 1925-09-17, BHA:[id number]  Admit date - 03/11/2016   Admitting Physician Ozella Rocks, MD  Outpatient Primary MD for the patient is Mickie Hillier, MD  Outpatient specialists:   LOS - 2  days    Chief Complaint  Patient presents with  . Fall       Brief summary   82 y.o.malewith past medical history significant for chronic systolic heart failure, last 2-D echo in 08/2015 showed ejection fraction 35% with diffuse hypokinesis A. fib on Plavix, hypertension, dementia who presented to [facility] with altered mental status with worsening shortness of breath at rest, fatigue and lower extremity edema. Patient was found to be hypoxic on the admission with oxygen saturation of 88% on room air but this has improved to 91-99% with nasal cannula oxygen support. Chest x-ray was significant for congestive heart failure with pulmonary interstitial edema and bilateral pleural effusions. CT head and cervical spine showed degenerative changes without acute intracranial findings. Patient was started on IV Lasix for acute decompensated CHF.   Assessment & Plan   Principal Problem:   Acute respiratory failure with hypoxia (HCC) / acute on chronic combined systolic and diastolic congestive heart failure- Based on severity of symptoms at the time of the admission CHF likely NYHA grade 4 as pt have symptoms of shortness of breath and fatigue at rest -  BNP on this admission 591 - 2-D echo with EF of 30-35% with diffuse hypokinesis, grade 2 diastolic dysfunction, moderate pulmonary hypertension, trivial pericardial effusion  -Increase Lasix to 40 mg IV every 8 hours - Negative balance of 1.6 L weight down from 241 on admission to 240lbs - Cardiology consulted  Active Problems:   A-fib (HCC) - CHADS vasc score 5 -  Continue plavix - Rate controlled with Cardizem 60 mg Q 12 hours   Hypokalemia -Stable   Acute encephalopathy / history of dementia without behavioral disturbance  - Altered mental status likely dementia, currently improved, at his baseline mental status per his wife at the bedside  Essential hypertension - Continue diltiazem, lisinopril  Dyslipidemia associated with type 2 diabetes mellitus - Continue statin therapy   Diabetes mellitus without complications without long-term insulin use - on Amaryl at home but here in hospital he is on sliding scale insulin   Code Status: DNR DVT Prophylaxis:  SCD's Family Communication: Discussed in detail with the patient, all imaging results, lab results explained to the patient and wife    Disposition Plan: Transfer to telemetry floor  Time Spent in minutes   25 minutes  Procedures:  2-D echo  Consultants:   Cardiology  Antimicrobials:      Medications  Scheduled Meds: . atorvastatin  40 mg Oral Daily  . clopidogrel  75 mg Oral Daily  . diltiazem  60 mg Oral Q12H  . furosemide  40 mg Intravenous Q8H  . insulin aspart  0-9 Units Subcutaneous TID WC  .  lisinopril  40 mg Oral q morning - 10a  . potassium chloride  40 mEq Oral Daily  . sodium chloride flush  3 mL Intravenous Q12H  . tiotropium  18 mcg Inhalation Daily   Continuous Infusions:  PRN Meds:.acetaminophen **OR** acetaminophen, hydrALAZINE, ondansetron **OR** ondansetron (ZOFRAN) IV   Antibiotics   Anti-infectives    None        Subjective:   Omar Chan was seen and examined today.  Overall improving, shortness of breath is improving. Alert and oriented, appetite poor. Patient denies dizziness, chest pain, abdominal pain, N/V/D/C, new weakness, numbess, tingling. No acute events overnight.    Objective:   Vitals:   03/13/16 0001 03/13/16 0404 03/13/16 0750 03/13/16 0929  BP: (!) 155/96 134/88 (!) 110/92   Pulse: (!) 121 (!) 58 (!) 116     Resp: 18 (!) 35 17   Temp: 97.6 F (36.4 C) 98.1 F (36.7 C) 97 F (36.1 C)   TempSrc: Oral Oral Axillary   SpO2: 97% 96% 97% 94%  Weight:  108.9 kg (240 lb)    Height:        Intake/Output Summary (Last 24 hours) at 03/13/16 1038 Last data filed at 03/12/16 2146  Gross per 24 hour  Intake              563 ml  Output                0 ml  Net              563 ml     Wt Readings from Last 3 Encounters:  03/13/16 108.9 kg (240 lb)  02/27/16 112.9 kg (248 lb 12.8 oz)  01/12/16 109.3 kg (241 lb)     Exam  General: Alert and oriented x 3, NAD  HEENT:  PERRLA, EOMI, Anicteric Sclera, mucous membranes moist.   Neck: Supple, no JVD, no masses  Cardiovascular: S1 S2 auscultated, no rubs, murmurs or gallops. Regular rate and rhythm.  Respiratory:Decreased breath sounds at the bases  Gastrointestinal: Soft, nontender, nondistended, + bowel sounds  Ext: no cyanosis clubbing or edema  Neuro: AAOx3, Cr N's II- XII. Strength 5/5 upper and lower extremities bilaterally  Skin: No rashes  Psych: Normal affect and demeanor, alert and oriented x3    Data Reviewed:  I have personally reviewed following labs and imaging studies  Micro Results Recent Results (from the past 240 hour(s))  MRSA PCR Screening     Status: None   Collection Time: 03/11/16  8:03 PM  Result Value Ref Range Status   MRSA by PCR NEGATIVE NEGATIVE Final    Comment:        The GeneXpert MRSA Assay (FDA approved for NASAL specimens only), is one component of a comprehensive MRSA colonization surveillance program. It is not intended to diagnose MRSA infection nor to guide or monitor treatment for MRSA infections.     Radiology Reports Dg Chest 2 View  Result Date: 03/11/2016 CLINICAL DATA:  Fall. EXAM: CHEST  2 VIEW COMPARISON:  09/02/2013 . FINDINGS: Cardiomegaly with pulmonary vascular prominence and bilateral interstitial prominence consistent congestive heart failure. Bilateral pleural  effusions. No pneumothorax . IMPRESSION: Congestive heart failure with pulmonary interstitial edema and bilateral pleural effusions. Electronically Signed   By: Maisie Fus  Register   On: 03/11/2016 07:48   Ct Head Wo Contrast  Result Date: 03/11/2016 CLINICAL DATA:  Recent fall with headaches and neck pain, initial encounter EXAM: CT HEAD WITHOUT CONTRAST CT CERVICAL SPINE  WITHOUT CONTRAST TECHNIQUE: Multidetector CT imaging of the head and cervical spine was performed following the standard protocol without intravenous contrast. Multiplanar CT image reconstructions of the cervical spine were also generated. COMPARISON:  09/12/2013 FINDINGS: CT HEAD FINDINGS Bony calvarium is intact. Mild atrophic and ischemic changes are again seen. No findings to suggest acute hemorrhage, acute infarction or space-occupying mass lesion are noted for age CT CERVICAL SPINE FINDINGS Seven cervical segments are well visualized. Vertebral body height is well maintained. Disc space narrowing is noted hole C6-7 with osteophytic changes from C5 to T2. No acute fracture or acute facet abnormality is noted. Small pleural effusions are noted bilaterally. IMPRESSION: CT of the head: Chronic atrophic and ischemic changes. CT of the cervical spine: Degenerative changes without acute bony abnormality Bilateral pleural effusions. Electronically Signed   By: Alcide CleverMark  Lukens M.D.   On: 03/11/2016 09:22   Ct Cervical Spine Wo Contrast  Result Date: 03/11/2016 CLINICAL DATA:  Recent fall with headaches and neck pain, initial encounter EXAM: CT HEAD WITHOUT CONTRAST CT CERVICAL SPINE WITHOUT CONTRAST TECHNIQUE: Multidetector CT imaging of the head and cervical spine was performed following the standard protocol without intravenous contrast. Multiplanar CT image reconstructions of the cervical spine were also generated. COMPARISON:  09/12/2013 FINDINGS: CT HEAD FINDINGS Bony calvarium is intact. Mild atrophic and ischemic changes are again seen. No  findings to suggest acute hemorrhage, acute infarction or space-occupying mass lesion are noted for age CT CERVICAL SPINE FINDINGS Seven cervical segments are well visualized. Vertebral body height is well maintained. Disc space narrowing is noted hole C6-7 with osteophytic changes from C5 to T2. No acute fracture or acute facet abnormality is noted. Small pleural effusions are noted bilaterally. IMPRESSION: CT of the head: Chronic atrophic and ischemic changes. CT of the cervical spine: Degenerative changes without acute bony abnormality Bilateral pleural effusions. Electronically Signed   By: Alcide CleverMark  Lukens M.D.   On: 03/11/2016 09:22   Dg Knee Complete 4 Views Left  Result Date: 03/11/2016 CLINICAL DATA:  False morning with knee pain, initial encounter EXAM: LEFT KNEE - COMPLETE 4+ VIEW COMPARISON:  None. FINDINGS: Knee prosthesis is noted. No acute fracture or loosening is identified. No joint effusion or soft tissue abnormality is seen. IMPRESSION: Status post knee replacement.  No acute abnormality noted. Electronically Signed   By: Alcide CleverMark  Lukens M.D.   On: 03/11/2016 13:20    Lab Data:  CBC:  Recent Labs Lab 03/11/16 0757 03/12/16 0228 03/13/16 0321  WBC 6.6 9.3 7.6  NEUTROABS 5.6  --   --   HGB 12.7* 12.9* 12.7*  HCT 40.7 40.2 40.5  MCV 101.0* 98.3 101.3*  PLT 199 229 204   Basic Metabolic Panel:  Recent Labs Lab 03/11/16 0757 03/12/16 0228 03/13/16 0321  NA 140 143 144  K 3.8 3.3* 3.5  CL 109 105 106  CO2 23 29 30   GLUCOSE 143* 150* 105*  BUN 21* 16 17  CREATININE 1.12 1.00 0.99  CALCIUM 8.6* 8.7* 8.6*   GFR: Estimated Creatinine Clearance: 65.7 mL/min (by C-G formula based on SCr of 0.99 mg/dL). Liver Function Tests:  Recent Labs Lab 03/11/16 0757  AST 18  ALT 16*  ALKPHOS 80  BILITOT 1.0  PROT 6.0*  ALBUMIN 3.6   No results for input(s): LIPASE, AMYLASE in the last 168 hours. No results for input(s): AMMONIA in the last 168 hours. Coagulation  Profile: No results for input(s): INR, PROTIME in the last 168 hours. Cardiac Enzymes: No results  for input(s): CKTOTAL, CKMB, CKMBINDEX, TROPONINI in the last 168 hours. BNP (last 3 results) No results for input(s): PROBNP in the last 8760 hours. HbA1C: No results for input(s): HGBA1C in the last 72 hours. CBG:  Recent Labs Lab 03/12/16 0814 03/12/16 1141 03/12/16 1528 03/12/16 2157 03/13/16 0746  GLUCAP 134* 136* 152* 115* 109*   Lipid Profile: No results for input(s): CHOL, HDL, LDLCALC, TRIG, CHOLHDL, LDLDIRECT in the last 72 hours. Thyroid Function Tests:  Recent Labs  03/11/16 1647  TSH 3.338   Anemia Panel:  Recent Labs  03/11/16 1647  VITAMINB12 1,035*   Urine analysis:    Component Value Date/Time   COLORURINE YELLOW 03/11/2016 1156   APPEARANCEUR CLEAR 03/11/2016 1156   LABSPEC 1.025 03/11/2016 1156   PHURINE 6.0 03/11/2016 1156   GLUCOSEU NEGATIVE 03/11/2016 1156   HGBUR NEGATIVE 03/11/2016 1156   BILIRUBINUR SMALL (A) 03/11/2016 1156   KETONESUR 15 (A) 03/11/2016 1156   PROTEINUR 100 (A) 03/11/2016 1156   UROBILINOGEN 0.2 09/12/2013 1801   NITRITE NEGATIVE 03/11/2016 1156   LEUKOCYTESUR SMALL (A) 03/11/2016 1156     RAI,RIPUDEEP M.D. [facility] 03/13/2016, 10:38 AM  Pager: [phone] Between 7am to 7pm - call Pager - [phone]  After 7pm go to [email or web address] - password [email or web address]  Call night coverage person covering after 7pm

## 2016-03-13 NOTE — Progress Notes (Signed)
Attempted to call report to 2w. Nurse not assigned to bed at this time. 2H number left with charge nurse.

## 2016-03-14 ENCOUNTER — Institutional Professional Consult (permissible substitution): Payer: Medicare Other | Admitting: Neurology

## 2016-03-14 LAB — BASIC METABOLIC PANEL
ANION GAP: 8 (ref 5–15)
BUN: 23 mg/dL — ABNORMAL HIGH (ref 6–20)
CHLORIDE: 101 mmol/L (ref 101–111)
CO2: 34 mmol/L — ABNORMAL HIGH (ref 22–32)
Calcium: 8.5 mg/dL — ABNORMAL LOW (ref 8.9–10.3)
Creatinine, Ser: 1.2 mg/dL (ref 0.61–1.24)
GFR calc non Af Amer: 52 mL/min — ABNORMAL LOW (ref >60)
Glucose, Bld: 141 mg/dL — ABNORMAL HIGH (ref 65–99)
POTASSIUM: 3.7 mmol/L (ref 3.5–5.1)
SODIUM: 143 mmol/L (ref 135–145)

## 2016-03-14 LAB — CBC
HEMATOCRIT: 39.1 % (ref 39.0–52.0)
HEMOGLOBIN: 12.1 g/dL — AB (ref 13.0–17.0)
MCH: 31.5 pg (ref 26.0–34.0)
MCHC: 30.9 g/dL (ref 30.0–36.0)
MCV: 101.8 fL — AB (ref 78.0–100.0)
Platelets: 223 10*3/uL (ref 150–400)
RBC: 3.84 MIL/uL — AB (ref 4.22–5.81)
RDW: 14 % (ref 11.5–15.5)
WBC: 8.4 10*3/uL (ref 4.0–10.5)

## 2016-03-14 LAB — GLUCOSE, CAPILLARY
GLUCOSE-CAPILLARY: 133 mg/dL — AB (ref 65–99)
GLUCOSE-CAPILLARY: 104 mg/dL — AB (ref 65–99)
GLUCOSE-CAPILLARY: 159 mg/dL — AB (ref 65–99)
Glucose-Capillary: 163 mg/dL — ABNORMAL HIGH (ref 65–99)

## 2016-03-14 MED ORDER — FUROSEMIDE 40 MG PO TABS
40.0000 mg | ORAL_TABLET | Freq: Two times a day (BID) | ORAL | Status: DC
  Administered 2016-03-14 – 2016-03-15 (×4): 40 mg via ORAL
  Filled 2016-03-14 (×4): qty 1

## 2016-03-14 NOTE — Progress Notes (Signed)
    Subjective:  No chest pain or dyspnea. Feels ok. Asks about going home today.  Objective:  Vital Signs in the last 24 hours: Temp:  [97.6 F (36.4 C)-98.3 F (36.8 C)] 97.6 F (36.4 C) (08/17 16100608) Pulse Rate:  [57-116] 59 (08/17 0608) Resp:  [18-22] 20 (08/17 [id number]) BP: (90-131)/(54-97) 90/74 (08/17 0608) SpO2:  [94 %-98 %] 98 % (08/17 0608) Weight:  [106.1 kg (233 lb 12.8 oz)] 106.1 kg (233 lb 12.8 oz) (08/17 54090608)  Intake/Output from previous day: 08/16 0701 - 08/17 0700 In: 380 [P.O.:380] Out: 1200 [Urine:1200]  Physical Exam: Pt is alert, in NAD HEENT: normal Neck: JVP - normal but difficult to estimate Lungs: CTA bilaterally CV: irregularly irregular without murmur or gallop Abd: soft, NT Ext: no edema Skin: warm/dry no rash  Lab Results:  Recent Labs  03/13/16 0321 03/14/16 0243  WBC 7.6 8.4  HGB 12.7* 12.1*  PLT 204 223    Recent Labs  03/13/16 0321 03/14/16 0243  NA 144 143  K 3.5 3.7  CL 106 101  CO2 30 34*  GLUCOSE 105* 141*  BUN 17 23*  CREATININE 0.99 1.20   No results for input(s): TROPONINI in the last 72 hours.  Invalid input(s): CK, MB  2D Echo: Study Conclusions  - Left ventricle: The cavity size was normal. There was mild focal   basal hypertrophy of the septum. Systolic function was moderately   to severely reduced. The estimated ejection fraction was in the   range of 30% to 35%. Diffuse hypokinesis. Features are consistent   with a pseudonormal left ventricular filling pattern, with   concomitant abnormal relaxation and increased filling pressure   (grade 2 diastolic dysfunction). - Ventricular septum: The contour showed diastolic flattening. - Aortic valve: Trileaflet; moderately thickened, mildly calcified   leaflets. There was mild regurgitation. - Left atrium: The atrium was moderately dilated. - Right ventricle: The cavity size was mildly dilated. Wall   thickness was normal. Systolic function was mildly  reduced. - Right atrium: The atrium was mildly dilated. - Pulmonary arteries: Systolic pressure was moderately increased.   PA peak pressure: 60 mm Hg (S). - Pericardium, extracardiac: A trivial pericardial effusion was   identified circumferential to the heart. Features were not   consistent with tamponade physiology.  Impressions:  - Compared to the prior study, there has been no significant   interval change.  Tele: Atrial fibrillation - HR 120-120  Assessment/Plan:  1. Chronic atrial fibrillation: not a candidate for anticoagulation per outpatient cardiology notes (elderly, GI bleeding history, frequent falls). Toprol XL started. HR elevated but hasn't received dose of Toprol yet this am. Will follow.   2. Acute on chronic systolic heart failure: does not appear volume overloaded on exam today. Echo reviewed as above. Continue Toprol XL, lisinopril, aldactone. Transition back to oral lasix - orders written for medication changes.   Omar Chan, Mabel Roll, M.D. 03/14/2016, 7:57 AM

## 2016-03-14 NOTE — Care Management Important Message (Signed)
Important Message  Patient Details  Name: Omar Chan MRN: [id number] Date of Birth: July 04, 1926   Medicare Important Message Given:  Yes    [name] Abena 03/14/2016, 11:44 AM

## 2016-03-14 NOTE — NC FL2 (Signed)
[place] MEDICAID FL2 LEVEL OF CARE SCREENING TOOL     IDENTIFICATION  Patient Name: Omar Chan D Chavero Birthdate: 03-01-1926 Sex: male Admission Date (Current Location): 03/11/2016  [facility] [place]   Facility and Address:  [facility], [address], [place], [place] [address]      Provider Number: [id number]  Attending Physician Name and Address:  Cathren Harshipudeep K Rai, MD  Relative Name and Phone Number:       Current Level of Care: Hospital Recommended Level of Care: Skilled Nursing Facility Prior Approval Number:    Date Approved/Denied:   PASRR Number: [id number]  Discharge Plan: SNF    Current Diagnoses: Patient Active Problem List   Diagnosis Date Noted  . Acute respiratory failure with hypoxia (HCC) 03/11/2016  . Acute respiratory failure (HCC) 03/11/2016  . Acute encephalopathy 03/11/2016  . Fall 03/11/2016  . Paroxysmal atrial fibrillation (HCC)   . GERD (gastroesophageal reflux disease)   . Acute on chronic systolic CHF (congestive heart failure) (HCC)   . Dementia 02/28/2016  . Bilateral carotid bruits 03/08/2015  . Atrial fibrillation (HCC) 06/03/2014  . Symptomatic anemia 05/31/2014  . Anemia 05/25/2014  . GI bleed 05/25/2014  . Warfarin-induced coagulopathy (HCC) 05/25/2014  . Paroxysmal a-fib (HCC) 05/25/2014  . Peripheral edema 04/19/2014  . Dyslipidemia 10/08/2013  . Bradycardia 09/12/2013  . Edema 12/03/2010  . A-fib (HCC)   . Diabetes mellitus with complication (HCC)   . Exogenous obesity   . Hypertension     Orientation RESPIRATION BLADDER Height & Weight     Self, Place  Normal Incontinent Weight: 106.1 kg (233 lb 12.8 oz) Height:  6' (182.9 cm)  BEHAVIORAL SYMPTOMS/MOOD NEUROLOGICAL BOWEL NUTRITION STATUS      Continent Diet (carb modified)  AMBULATORY STATUS COMMUNICATION OF NEEDS Skin   Extensive Assist   Other (Comment) (skin tear on arm, dressing change PRN)                        Personal Care Assistance Level of Assistance  Bathing, Dressing Bathing Assistance: Maximum assistance   Dressing Assistance: Maximum assistance     Functional Limitations Info             SPECIAL CARE FACTORS FREQUENCY  PT (By licensed PT), OT (By licensed OT)     PT Frequency: 5/wk OT Frequency: 5/wk            Contractures      Additional Factors Info  Code Status, Allergies, Insulin Sliding Scale Code Status Info: DNR Allergies Info: Aspirin, Trazodone And Nefazodone   Insulin Sliding Scale Info: 3/day       Current Medications (03/14/2016):  This is the current hospital active medication list Current Facility-Administered Medications  Medication Dose Route Frequency Provider Last Rate Last Dose  . acetaminophen (TYLENOL) tablet 650 mg  650 mg Oral Q6H PRN Gwenyth BenderKaren M Black, NP   650 mg at 03/12/16 1146   Or  . acetaminophen (TYLENOL) suppository 650 mg  650 mg Rectal Q6H PRN Gwenyth BenderKaren M Black, NP      . atorvastatin (LIPITOR) tablet 40 mg  40 mg Oral Daily Gwenyth BenderKaren M Black, NP   40 mg at 03/14/16 1042  . clopidogrel (PLAVIX) tablet 75 mg  75 mg Oral Daily Gwenyth BenderKaren M Black, NP   75 mg at 03/14/16 1031  . furosemide (LASIX) tablet 40 mg  40 mg Oral BID Tonny BollmanMichael Cooper, MD   40  mg at 03/14/16 0856  . hydrALAZINE (APRESOLINE) injection 5 mg  5 mg Intravenous Q4H PRN Gwenyth BenderKaren M Black, NP   5 mg at 03/11/16 2044  . insulin aspart (novoLOG) injection 0-9 Units  0-9 Units Subcutaneous TID WC Gwenyth BenderKaren M Black, NP   2 Units at 03/14/16 1211  . lisinopril (PRINIVIL,ZESTRIL) tablet 40 mg  40 mg Oral q morning - 10a Gwenyth BenderKaren M Black, NP   40 mg at 03/14/16 1031  . metoprolol succinate (TOPROL-XL) 24 hr tablet 25 mg  25 mg Oral Daily Little IshikawaErin E Smith, NP   25 mg at 03/14/16 1037  . ondansetron (ZOFRAN) tablet 4 mg  4 mg Oral Q6H PRN Gwenyth BenderKaren M Black, NP       Or  . ondansetron [facility](ZOFRAN) injection 4 mg  4 mg Intravenous Q6H PRN Lesle ChrisKaren M Black, NP      . potassium chloride SA (K-DUR,KLOR-CON) CR tablet 40  mEq  40 mEq Oral Daily Ozella Rocksavid J Merrell, MD   40 mEq at 03/14/16 1035  . sodium chloride flush (NS) 0.9 % injection 3 mL  3 mL Intravenous Q12H Lesle ChrisKaren M Black, NP   3 mL at 03/14/16 1042  . spironolactone (ALDACTONE) tablet 12.5 mg  12.5 mg Oral Daily Little IshikawaErin E Smith, NP   12.5 mg at 03/14/16 1033  . tiotropium (SPIRIVA) inhalation capsule 18 mcg  18 mcg Inhalation Daily Gwenyth BenderKaren M Black, NP   18 mcg at 03/14/16 1138     Discharge Medications: Please see discharge summary for a list of discharge medications.  Relevant Imaging Results:  Relevant Lab Results:   Additional Information SS#: [id number]  Burna SisUris, Yehya Brendle H, LCSW

## 2016-03-14 NOTE — Clinical Social Work Note (Signed)
Clinical Social Work Assessment  Patient Details  Name: Omar Chan MRN: [id number] Date of Birth: [birth year]  Date of referral:  03/14/16               Reason for consult:                   Permission sought to share information with:  Facility Sport and exercise psychologist, Family Supports Permission granted to share information::  Yes, Verbal Permission Granted  Name::     Administrator, arts::  SNFs  Relationship::  Wife  Contact Information:     Housing/Transportation Living arrangements for the past 2 months:  [place] of Information:  Patient, Spouse Patient Interpreter Needed:  None Criminal Activity/Legal Involvement Pertinent to Current Situation/Hospitalization:  No - Comment as needed Significant Relationships:  Spouse Lives with:  Spouse Do you feel safe going back to the place where you live?  Yes Need for family participation in patient care:  Yes (Comment)  Care giving concerns:  The patient is aggregable for short term rehab at discharge. Patient would like to rebuild his strength to return home.   Social Worker assessment / plan:  CSW met with patient at beside to complete assessment. Patient was resting comfortably in bed. Patient's wife was also at bedside. CSW explained PT recommendation for SNF placement. CSW explained SNF search and placement process to the patient and patient's wife  and answered their questions. P CSW will follow up with bed offers.  Employment status:  Retired Nurse, adult PT Recommendations:  [place] / Referral to community resources:  [place]  Patient/Family's Response to care: The patient appears happy with the care  he is receiving in hospital and is appreciative of CSW assistance.  Patient/Family's Understanding of and Emotional Response to Diagnosis, Current Treatment, and Prognosis:  The patient's wife  has a good understanding of why the patient  was admitted.  She understands the care plan and what the patient will need post discharge. Emotional Assessment Appearance:  Appears older than stated age Attitude/Demeanor/Rapport:   (Patient was appropriate. ) Affect (typically observed):  Accepting, Appropriate, Calm Orientation:  Oriented to Self Alcohol / Substance use:  Not Applicable Psych involvement (Current and /or in the community):  No (Comment)  Discharge Needs  Concerns to be addressed:  Discharge Planning Concerns Readmission within the last 30 days:  No Current discharge risk:  Physical Impairment Barriers to Discharge:  Continued Medical Work up   [employer], LCSW 03/14/2016, 4:52 PM

## 2016-03-14 NOTE — Progress Notes (Signed)
[facility]                                                                              Patient Demographics  Omar Chan, is a 82 y.o. male, DOB - 03-Mar-1926, ZOX:[id number]  Admit date - 03/11/2016   Admitting Physician Ozella Rocks, MD  Outpatient Primary MD for the patient is Mickie Hillier, MD  Outpatient specialists:   LOS - 3  days    Chief Complaint  Patient presents with  . Fall       Brief summary   82 y.o.malewith past medical history significant for chronic systolic heart failure, last 2-D echo in 08/2015 showed ejection fraction 35% with diffuse hypokinesis A. fib on Plavix, hypertension, dementia who presented to [facility] with altered mental status with worsening shortness of breath at rest, fatigue and lower extremity edema. Patient was found to be hypoxic on the admission with oxygen saturation of 88% on room air but this has improved to 91-99% with nasal cannula oxygen support. Chest x-ray was significant for congestive heart failure with pulmonary interstitial edema and bilateral pleural effusions. CT head and cervical spine showed degenerative changes without acute intracranial findings. Patient was started on IV Lasix for acute decompensated CHF.   Assessment & Plan   Principal Problem:   Acute respiratory failure with hypoxia (HCC) / acute on chronic combined systolic and diastolic congestive heart failure- Based on severity of symptoms at the time of the admission CHF likely NYHA grade 4 as pt have symptoms of shortness of breath and fatigue at rest -  BNP on this admission 591 - 2-D echo with EF of 30-35% with diffuse hypokinesis, grade 2 diastolic dysfunction, moderate pulmonary hypertension, trivial pericardial effusion  -Improving, per cardiology transition back to oral Lasix - Negative balance of 2.2 L weight down from 241 on admission to 233 lbs  Active Problems:   A-fib (HCC) - CHADS vasc score 5 - Continue  plavix - Rate controlled with Cardizem 60 mg Q 12 hours   Hypokalemia -Stable   Acute encephalopathy / history of dementia without behavioral disturbance  - Altered mental status likely dementia, currently improved, at his baseline mental status per his wife and daughter at the bedside  Essential hypertension - Continue diltiazem, lisinopril  Dyslipidemia associated with type 2 diabetes mellitus - Continue statin therapy   Diabetes mellitus without complications without long-term insulin use - on Amaryl at home but here in hospital he is on sliding scale insulin   Generalized weakness and debility - Per patient's family, requested physical therapy evaluation again today. Per wife, difficult to take care of the patient at home with his weakness. - Repeat PT eval pending, also obtain home O2 evaluation  Code Status: DNR DVT Prophylaxis:  SCD's Family Communication: Discussed in detail with the patient, all imaging results, lab results explained to the patient and wife, daughter    Disposition Plan:  Time Spent in minutes   25 minutes  Procedures:  2-D echo  Consultants:   Cardiology  Antimicrobials:      Medications  Scheduled Meds: . atorvastatin  40 mg Oral Daily  .  clopidogrel  75 mg Oral Daily  . furosemide  40 mg Oral BID  . insulin aspart  0-9 Units Subcutaneous TID WC  . lisinopril  40 mg Oral q morning - 10a  . metoprolol succinate  25 mg Oral Daily  . potassium chloride  40 mEq Oral Daily  . sodium chloride flush  3 mL Intravenous Q12H  . spironolactone  12.5 mg Oral Daily  . tiotropium  18 mcg Inhalation Daily   Continuous Infusions:  PRN Meds:.acetaminophen **OR** acetaminophen, hydrALAZINE, ondansetron **OR** ondansetron (ZOFRAN) IV   Antibiotics   Anti-infectives    None        Subjective:   Omar Chan was seen and examined today. Shortness of breath is improving however still feels very weak and tired. Per family at the  bedside, will be difficult to care for him if patient is not functional independently.  Patient denies dizziness, chest pain, abdominal pain, N/V/D/C, new weakness, numbess, tingling. No acute events overnight.    Objective:   Vitals:   03/13/16 1745 03/13/16 2009 03/14/16 0608 03/14/16 1000  BP: 131/64 117/68 90/74 112/77  Pulse: 66 (!) 57 (!) 59 60  Resp:  20 20 18   Temp:  98.3 F (36.8 C) 97.6 F (36.4 C) 97.9 F (36.6 C)  TempSrc:  Oral Oral Oral  SpO2:  94% 98% 97%  Weight:   106.1 kg (233 lb 12.8 oz)   Height:        Intake/Output Summary (Last 24 hours) at 03/14/16 1140 Last data filed at 03/14/16 0730  Gross per 24 hour  Intake              380 ml  Output             1200 ml  Net             -820 ml     Wt Readings from Last 3 Encounters:  03/14/16 106.1 kg (233 lb 12.8 oz)  02/27/16 112.9 kg (248 lb 12.8 oz)  01/12/16 109.3 kg (241 lb)     Exam  General: Alert and oriented x 3, NAD  HEENT:  PERRLA, EOMI, Anicteric Sclera, mucous membranes moist.   Neck: Supple, no JVD, no masses  Cardiovascular: S1 S2 auscultated, no rubs, murmurs or gallops. Regular rate and rhythm.  Respiratory:Decreased breath sounds at the bases  Gastrointestinal: Soft, nontender, nondistended, + bowel sounds  Ext: no cyanosis clubbing or edema  Neuro: AAOx3, Cr N's II- XII. Strength 5/5 upper and lower extremities bilaterally  Skin: No rashes  Psych: Normal affect and demeanor, alert and oriented x3    Data Reviewed:  I have personally reviewed following labs and imaging studies  Micro Results Recent Results (from the past 240 hour(s))  MRSA PCR Screening     Status: None   Collection Time: 03/11/16  8:03 PM  Result Value Ref Range Status   MRSA by PCR NEGATIVE NEGATIVE Final    Comment:        The GeneXpert MRSA Assay (FDA approved for NASAL specimens only), is one component of a comprehensive MRSA colonization surveillance program. It is not intended to diagnose  MRSA infection nor to guide or monitor treatment for MRSA infections.     Radiology Reports Dg Chest 2 View  Result Date: 03/11/2016 CLINICAL DATA:  Fall. EXAM: CHEST  2 VIEW COMPARISON:  09/02/2013 . FINDINGS: Cardiomegaly with pulmonary vascular prominence and bilateral interstitial prominence consistent congestive heart failure. Bilateral pleural effusions. No pneumothorax .  IMPRESSION: Congestive heart failure with pulmonary interstitial edema and bilateral pleural effusions. Electronically Signed   By: Maisie Fushomas  Register   On: 03/11/2016 07:48   Ct Head Wo Contrast  Result Date: 03/11/2016 CLINICAL DATA:  Recent fall with headaches and neck pain, initial encounter EXAM: CT HEAD WITHOUT CONTRAST CT CERVICAL SPINE WITHOUT CONTRAST TECHNIQUE: Multidetector CT imaging of the head and cervical spine was performed following the standard protocol without intravenous contrast. Multiplanar CT image reconstructions of the cervical spine were also generated. COMPARISON:  09/12/2013 FINDINGS: CT HEAD FINDINGS Bony calvarium is intact. Mild atrophic and ischemic changes are again seen. No findings to suggest acute hemorrhage, acute infarction or space-occupying mass lesion are noted for age CT CERVICAL SPINE FINDINGS Seven cervical segments are well visualized. Vertebral body height is well maintained. Disc space narrowing is noted hole C6-7 with osteophytic changes from C5 to T2. No acute fracture or acute facet abnormality is noted. Small pleural effusions are noted bilaterally. IMPRESSION: CT of the head: Chronic atrophic and ischemic changes. CT of the cervical spine: Degenerative changes without acute bony abnormality Bilateral pleural effusions. Electronically Signed   By: Alcide CleverMark  Lukens M.D.   On: 03/11/2016 09:22   Ct Cervical Spine Wo Contrast  Result Date: 03/11/2016 CLINICAL DATA:  Recent fall with headaches and neck pain, initial encounter EXAM: CT HEAD WITHOUT CONTRAST CT CERVICAL SPINE WITHOUT  CONTRAST TECHNIQUE: Multidetector CT imaging of the head and cervical spine was performed following the standard protocol without intravenous contrast. Multiplanar CT image reconstructions of the cervical spine were also generated. COMPARISON:  09/12/2013 FINDINGS: CT HEAD FINDINGS Bony calvarium is intact. Mild atrophic and ischemic changes are again seen. No findings to suggest acute hemorrhage, acute infarction or space-occupying mass lesion are noted for age CT CERVICAL SPINE FINDINGS Seven cervical segments are well visualized. Vertebral body height is well maintained. Disc space narrowing is noted hole C6-7 with osteophytic changes from C5 to T2. No acute fracture or acute facet abnormality is noted. Small pleural effusions are noted bilaterally. IMPRESSION: CT of the head: Chronic atrophic and ischemic changes. CT of the cervical spine: Degenerative changes without acute bony abnormality Bilateral pleural effusions. Electronically Signed   By: Alcide CleverMark  Lukens M.D.   On: 03/11/2016 09:22   Dg Knee Complete 4 Views Left  Result Date: 03/11/2016 CLINICAL DATA:  False morning with knee pain, initial encounter EXAM: LEFT KNEE - COMPLETE 4+ VIEW COMPARISON:  None. FINDINGS: Knee prosthesis is noted. No acute fracture or loosening is identified. No joint effusion or soft tissue abnormality is seen. IMPRESSION: Status post knee replacement.  No acute abnormality noted. Electronically Signed   By: Alcide CleverMark  Lukens M.D.   On: 03/11/2016 13:20    Lab Data:  CBC:  Recent Labs Lab 03/11/16 0757 03/11/16 2020 03/12/16 0228 03/13/16 0321 03/14/16 0243  WBC 6.6  --  9.3 7.6 8.4  NEUTROABS 5.6  --   --   --   --   HGB 12.7*  --  12.9* 12.7* 12.1*  HCT 40.7 37.5 40.2 40.5 39.1  MCV 101.0*  --  98.3 101.3* 101.8*  PLT 199  --  229 204 223   Basic Metabolic Panel:  Recent Labs Lab 03/11/16 0757 03/12/16 0228 03/13/16 0321 03/14/16 0243  NA 140 143 144 143  K 3.8 3.3* 3.5 3.7  CL 109 105 106 101  CO2  23 29 30  34*  GLUCOSE 143* 150* 105* 141*  BUN 21* 16 17 23*  CREATININE 1.12  1.00 0.99 1.20  CALCIUM 8.6* 8.7* 8.6* 8.5*   GFR: Estimated Creatinine Clearance: 53.6 mL/min (by C-G formula based on SCr of 1.2 mg/dL). Liver Function Tests:  Recent Labs Lab 03/11/16 0757  AST 18  ALT 16*  ALKPHOS 80  BILITOT 1.0  PROT 6.0*  ALBUMIN 3.6   No results for input(s): LIPASE, AMYLASE in the last 168 hours. No results for input(s): AMMONIA in the last 168 hours. Coagulation Profile: No results for input(s): INR, PROTIME in the last 168 hours. Cardiac Enzymes: No results for input(s): CKTOTAL, CKMB, CKMBINDEX, TROPONINI in the last 168 hours. BNP (last 3 results) No results for input(s): PROBNP in the last 8760 hours. HbA1C: No results for input(s): HGBA1C in the last 72 hours. CBG:  Recent Labs Lab 03/13/16 1201 03/13/16 1629 03/13/16 2036 03/14/16 0609 03/14/16 1108  GLUCAP 134* 173* 149* 133* 163*   Lipid Profile: No results for input(s): CHOL, HDL, LDLCALC, TRIG, CHOLHDL, LDLDIRECT in the last 72 hours. Thyroid Function Tests:  Recent Labs  03/11/16 1647  TSH 3.338   Anemia Panel:  Recent Labs  03/11/16 1647  VITAMINB12 1,035*   Urine analysis:    Component Value Date/Time   COLORURINE YELLOW 03/11/2016 1156   APPEARANCEUR CLEAR 03/11/2016 1156   LABSPEC 1.025 03/11/2016 1156   PHURINE 6.0 03/11/2016 1156   GLUCOSEU NEGATIVE 03/11/2016 1156   HGBUR NEGATIVE 03/11/2016 1156   BILIRUBINUR SMALL (A) 03/11/2016 1156   KETONESUR 15 (A) 03/11/2016 1156   PROTEINUR 100 (A) 03/11/2016 1156   UROBILINOGEN 0.2 09/12/2013 1801   NITRITE NEGATIVE 03/11/2016 1156   LEUKOCYTESUR SMALL (A) 03/11/2016 1156     Yael Coppess M.D. [facility] 03/14/2016, 11:40 AM  Pager: [phone] Between 7am to 7pm - call Pager - [phone]  After 7pm go to [email or web address] - password [email or web address]  Call night coverage person covering after 7pm

## 2016-03-15 DIAGNOSIS — R0989 Other specified symptoms and signs involving the circulatory and respiratory systems: Secondary | ICD-10-CM

## 2016-03-15 LAB — GLUCOSE, CAPILLARY
GLUCOSE-CAPILLARY: 141 mg/dL — AB (ref 65–99)
GLUCOSE-CAPILLARY: 148 mg/dL — AB (ref 65–99)
GLUCOSE-CAPILLARY: 190 mg/dL — AB (ref 65–99)
GLUCOSE-CAPILLARY: 154 mg/dL — AB (ref 65–99)

## 2016-03-15 MED ORDER — METOPROLOL SUCCINATE ER 25 MG PO TB24: 37.5000 mg | ORAL_TABLET | Freq: Every day | ORAL | 3 refills | Status: DC

## 2016-03-15 MED ORDER — SPIRONOLACTONE 25 MG PO TABS: 12.5000 mg | ORAL_TABLET | Freq: Every day | ORAL | 3 refills | Status: DC

## 2016-03-15 MED ORDER — POTASSIUM CHLORIDE CRYS ER 20 MEQ PO TBCR: 40.0000 meq | EXTENDED_RELEASE_TABLET | Freq: Every day | ORAL | 0 refills | Status: DC

## 2016-03-15 MED ORDER — METOPROLOL SUCCINATE ER 25 MG PO TB24: 37.5000 mg | ORAL_TABLET | Freq: Every day | ORAL | Status: DC

## 2016-03-15 MED ORDER — FUROSEMIDE 40 MG PO TABS: 40.0000 mg | ORAL_TABLET | Freq: Two times a day (BID) | ORAL | 3 refills | Status: DC

## 2016-03-15 MED ORDER — METOPROLOL SUCCINATE ER 25 MG PO TB24: 25.0000 mg | ORAL_TABLET | Freq: Every day | ORAL | 3 refills | Status: DC

## 2016-03-15 NOTE — Progress Notes (Signed)
Physical Therapy Treatment Patient Details Name: Omar Chan D North MRN: [id number] DOB: Aug 10, 1925 Today's Date: 03/15/2016    History of Present Illness Pt is a 82 y/o M who presented to the ED on 03/11/16 via EMS after being found outside sitting beside his car door. He was unsure how he got outside, there was also a large skin tear on his arm. CT of the head without acute abnormality.  C-spine without acute abnormality. Left knee xray without fracture.  He presents with acute respiratory failure with hypoxia related to mild CHF.  Pt's PMH includes CHF, dementia, paroxysmal a-fib, Bil TKA.    PT Comments    Pt globally weak, but more alert than previous session and able to participate.  Pt at this time requires extensive 2 person A for all mobility.  Continue to feel pt will need SNF level of care at D/C.  Will continue to follow.    Follow Up Recommendations  SNF     Equipment Recommendations  None recommended by PT    Recommendations for Other Services       Precautions / Restrictions Precautions Precautions: Fall Restrictions Weight Bearing Restrictions: No    Mobility  Bed Mobility Overal bed mobility: Needs Assistance;+2 for physical assistance Bed Mobility: Supine to Sit     Supine to sit: Mod assist;HOB elevated     General bed mobility comments: HOB elevated with pt attempting to do as much as he can, but needed A with scooting to EOB and bringing trunk up to sitting.    Transfers Overall transfer level: Needs assistance Equipment used: 2 person hand held assist Transfers: Sit to/from [employer] CorporationStand;Squat Pivot Transfers Sit to Stand: Max assist;+2 physical assistance   Squat pivot transfers: Total assist;+2 physical assistance     General transfer comment: pt does attempt to A with transfer.  cues and facilitation for UE use, rocking to use momentum, blocking Bil knees/feet, and utilized pad under hips for coming to stand and performing pivot to recliner.  pt unable to  achieve full standing and keeps Bil knees flexed and trunk flexed.    Ambulation/Gait                 Stairs            Wheelchair Mobility    Modified Rankin (Stroke Patients Only)       Balance Overall balance assessment: Needs assistance;History of Falls Sitting-balance support: Bilateral upper extremity supported;Feet supported Sitting balance-Leahy Scale: Poor   Postural control: Posterior lean Standing balance support: Bilateral upper extremity supported;During functional activity Standing balance-Leahy Scale: Zero                      Cognition Arousal/Alertness: Awake/alert Behavior During Therapy: WFL for tasks assessed/performed Overall Cognitive Status: History of cognitive impairments - at baseline                      Exercises General Exercises - Lower Extremity Ankle Circles/Pumps: AROM;Both;10 reps Long Arc Quad: AROM;Both;10 reps Hip Flexion/Marching: AROM;Both;10 reps    General Comments        Pertinent Vitals/Pain Pain Assessment: Faces Faces Pain Scale: Hurts little more Pain Location: Grimaces with L knee ROM, but when asked about pain, pt states he's "not sure". Pain Descriptors / Indicators: Grimacing Pain Intervention(s): Monitored during session;Premedicated before session;Repositioned    Home Living  Prior Function            PT Goals (current goals can now be found in the care plan section) Acute Rehab PT Goals Patient Stated Goal: Go Home. PT Goal Formulation: With family Time For Goal Achievement: 03/27/16 Potential to Achieve Goals: Fair Progress towards PT goals: Progressing toward goals    Frequency  Min 2X/week    PT Plan Current plan remains appropriate    Co-evaluation             End of Session Equipment Utilized During Treatment: Gait belt;Oxygen Activity Tolerance: Patient limited by fatigue Patient left: in chair;with call bell/phone within  reach;with chair alarm set;with family/visitor present     Time: [id number] PT Time Calculation (min) (ACUTE ONLY): 31 min  Charges:  $Therapeutic Activity: 23-37 mins                    G CodesSunny Schlein:      Breckin Zafar F, [place]PT [phone] 03/15/2016, 9:49 AM

## 2016-03-15 NOTE — Progress Notes (Signed)
Patient Name: Omar Chan Date of Encounter: 03/15/2016     Principal Problem:   Acute respiratory failure with hypoxia [facility](HCC) Active Problems:   Chronic atrial fibrillation (HCC)   Diabetes mellitus with complication (HCC)   Exogenous obesity   Hypertension   Dyslipidemia   Anemia   Bilateral carotid bruits   Dementia   GERD (gastroesophageal reflux disease)   Acute encephalopathy   Fall    SUBJECTIVE  Breathing much better. Slept well last night. No PND.  Ready to go home. They think he will need SNF placement .  CURRENT MEDS . atorvastatin  40 mg Oral Daily  . clopidogrel  75 mg Oral Daily  . furosemide  40 mg Oral BID  . insulin aspart  0-9 Units Subcutaneous TID WC  . lisinopril  40 mg Oral q morning - 10a  . [START ON 03/16/2016] metoprolol succinate  37.5 mg Oral Daily  . potassium chloride  40 mEq Oral Daily  . sodium chloride flush  3 mL Intravenous Q12H  . spironolactone  12.5 mg Oral Daily  . tiotropium  18 mcg Inhalation Daily    OBJECTIVE  Vitals:   03/14/16 1340 03/14/16 2211 03/15/16 0430 03/15/16 0845  BP: (!) 94/47 135/81 (!) 159/92 111/89  Pulse:  (!) 103 (!) 118 (!) 117  Resp:  20 19   Temp: 97.5 F (36.4 C) 97.5 F (36.4 C) 97.6 F (36.4 C)   TempSrc: Oral Oral Axillary   SpO2: 98% 95% 96%   Weight:   230 lb 12.8 oz (104.7 kg)   Height:        Intake/Output Summary (Last 24 hours) at 03/15/16 1036 Last data filed at 03/15/16 0217  Gross per 24 hour  Intake              360 ml  Output             2400 ml  Net            -2040 ml   Filed Weights   03/13/16 0404 03/14/16 0608 03/15/16 0430  Weight: 240 lb (108.9 kg) 233 lb 12.8 oz (106.1 kg) 230 lb 12.8 oz (104.7 kg)    PHYSICAL EXAM  General: Pleasant, NAD. obese Neuro: Alert and oriented X 3. Moves all extremities spontaneously. Psych: Normal affect. HEENT:  Normal  Neck: Supple without bruits or JVD. Lungs:  Resp regular and unlabored, CTA. Heart: irreg irreg, tachy no  s3, s4, or murmurs. Abdomen: Soft, non-tender, non-distended, BS + x 4.  Extremities: No clubbing, cyanosis or trace bilateral LE edema. DP/PT/Radials 2+ and equal bilaterally.  Accessory Clinical Findings  CBC  Recent Labs  03/13/16 0321 03/14/16 0243  WBC 7.6 8.4  HGB 12.7* 12.1*  HCT 40.5 39.1  MCV 101.3* 101.8*  PLT 204 223   Basic Metabolic Panel  Recent Labs  03/13/16 0321 03/14/16 0243  NA 144 143  K 3.5 3.7  CL 106 101  CO2 30 34*  GLUCOSE 105* 141*  BUN 17 23*  CREATININE 0.99 1.20  CALCIUM 8.6* 8.5*   TELE  afib with HR 110-120s  Radiology/Studies  Dg Chest 2 View  Result Date: 03/11/2016 CLINICAL DATA:  Fall. EXAM: CHEST  2 VIEW COMPARISON:  09/02/2013 . FINDINGS: Cardiomegaly with pulmonary vascular prominence and bilateral interstitial prominence consistent congestive heart failure. Bilateral pleural effusions. No pneumothorax . IMPRESSION: Congestive heart failure with pulmonary interstitial edema and bilateral pleural effusions. Electronically Signed   By: Maisie Fushomas  Register   On: 03/11/2016 07:48   Ct Head Wo Contrast  Result Date: 03/11/2016 CLINICAL DATA:  Recent fall with headaches and neck pain, initial encounter EXAM: CT HEAD WITHOUT CONTRAST CT CERVICAL SPINE WITHOUT CONTRAST TECHNIQUE: Multidetector CT imaging of the head and cervical spine was performed following the standard protocol without intravenous contrast. Multiplanar CT image reconstructions of the cervical spine were also generated. COMPARISON:  09/12/2013 FINDINGS: CT HEAD FINDINGS Bony calvarium is intact. Mild atrophic and ischemic changes are again seen. No findings to suggest acute hemorrhage, acute infarction or space-occupying mass lesion are noted for age CT CERVICAL SPINE FINDINGS Seven cervical segments are well visualized. Vertebral body height is well maintained. Disc space narrowing is noted hole C6-7 with osteophytic changes from C5 to T2. No acute fracture or acute facet  abnormality is noted. Small pleural effusions are noted bilaterally. IMPRESSION: CT of the head: Chronic atrophic and ischemic changes. CT of the cervical spine: Degenerative changes without acute bony abnormality Bilateral pleural effusions. Electronically Signed   By: Alcide CleverMark  Lukens M.D.   On: 03/11/2016 09:22   Ct Cervical Spine Wo Contrast  Result Date: 03/11/2016 CLINICAL DATA:  Recent fall with headaches and neck pain, initial encounter EXAM: CT HEAD WITHOUT CONTRAST CT CERVICAL SPINE WITHOUT CONTRAST TECHNIQUE: Multidetector CT imaging of the head and cervical spine was performed following the standard protocol without intravenous contrast. Multiplanar CT image reconstructions of the cervical spine were also generated. COMPARISON:  09/12/2013 FINDINGS: CT HEAD FINDINGS Bony calvarium is intact. Mild atrophic and ischemic changes are again seen. No findings to suggest acute hemorrhage, acute infarction or space-occupying mass lesion are noted for age CT CERVICAL SPINE FINDINGS Seven cervical segments are well visualized. Vertebral body height is well maintained. Disc space narrowing is noted hole C6-7 with osteophytic changes from C5 to T2. No acute fracture or acute facet abnormality is noted. Small pleural effusions are noted bilaterally. IMPRESSION: CT of the head: Chronic atrophic and ischemic changes. CT of the cervical spine: Degenerative changes without acute bony abnormality Bilateral pleural effusions. Electronically Signed   By: Alcide CleverMark  Lukens M.D.   On: 03/11/2016 09:22   Dg Knee Complete 4 Views Left  Result Date: 03/11/2016 CLINICAL DATA:  False morning with knee pain, initial encounter EXAM: LEFT KNEE - COMPLETE 4+ VIEW COMPARISON:  None. FINDINGS: Knee prosthesis is noted. No acute fracture or loosening is identified. No joint effusion or soft tissue abnormality is seen. IMPRESSION: Status post knee replacement.  No acute abnormality noted. Electronically Signed   By: Alcide CleverMark  Lukens M.D.   On:  03/11/2016 13:20   2D Echo: Study Conclusions - Left ventricle: The cavity size was normal. There was mild focal basal hypertrophy of the septum. Systolic function was moderately to severely reduced. The estimated ejection fraction was in the range of 30% to 35%. Diffuse hypokinesis. Features are consistent with a pseudonormal left ventricular filling pattern, with concomitant abnormal relaxation and increased filling pressure (grade 2 diastolic dysfunction). - Ventricular septum: The contour showed diastolic flattening. - Aortic valve: Trileaflet; moderately thickened, mildly calcified leaflets. There was mild regurgitation. - Left atrium: The atrium was moderately dilated. - Right ventricle: The cavity size was mildly dilated. Wall thickness was normal. Systolic function was mildly reduced. - Right atrium: The atrium was mildly dilated. - Pulmonary arteries: Systolic pressure was moderately increased. PA peak pressure: 60 mm Hg (S). - Pericardium, extracardiac: A trivial pericardial effusion was identified circumferential to the heart. Features were not consistent with  tamponade physiology. Impressions: - Compared to the prior study, there has been no significant interval change.   ASSESSMENT AND PLAN Mr. Dymek is a 82 year old male with a past medical history of chronic systolic CHF, DM, GERD, HLD, HTN, and PAF (not on anticoagulation due to falls and GI Bleed) who presented to the [facility] ED on 03/11/16 via EMS after being found outside sitting beside his car door. He was unsure how he got outside, there was also a large skin tear on his arm  Chronic atrial fibrillation: CHADSVASc score at least 5. Not a candidate for anticoagulation per outpatient cardiology notes (elderly, GI bleeding history, frequent falls). Toprol XL 25mg  started. HR still 110-120s. Will increase Toprol XL to 37.5mg  daily.    Acute on chronic systolic heart failure: does not appear volume  overloaded on exam today. Echo reviewed as above. Continue Toprol XL, lisinopril. Aldactone added this admission. Diuresed with IV lasix. Net neg 4.2L. Transitioned back to oral lasix yesterday (40mg  BID). Net neg 1.8L overnight.   HTN: BP well controlled currently  Hx of TIA: continue plavix and statin.   Debilitation: prior to admission he was fine walking with a walker. He has significant knee pain after fall and wife doesn't think she is going to be able to take care of him. PT is recommending SNF placement.  Will defer to primary team. Okay to discharge from a cardiac perspective. I will arrange for 1 week TOC appointment.   Signed, Cline Crock PA-C  Pager [phone]  Patient seen, examined. Available data reviewed. Agree with findings, assessment, and plan as outlined by Cline Crock, PA-C. The patient appears to be doing well from a cardiac perspective now. His leg edema has almost completely resolved. I agree with slowly increasing his metoprolol succinate for better heart rate control. Would continue oral furosemide at the current dose. Please call if we can be of any further assistance in his care.  Tonny Bollman, M.D. 03/15/2016 1:11 PM

## 2016-03-15 NOTE — Progress Notes (Addendum)
Physical Therapy Treatment Patient Details Name: Omar Chan MRN: [id number] DOB: 1926-06-08 Today's Date: 03/15/2016    History of Present Illness Pt is a 82 y/o M who presented to the ED on 03/11/16 via EMS after being found outside sitting beside his car door. He was unsure how he got outside, there was also a large skin tear on his arm. CT of the head without acute abnormality.  C-spine without acute abnormality. Left knee xray without fracture.  He presents with acute respiratory failure with hypoxia related to mild CHF.  Pt's PMH includes CHF, dementia, paroxysmal a-fib, Bil TKA.    PT Comments    Nsg requested PT to return to room to A with mobilizing pt for toileting.  Omar DolphinUtilized Stedy this session to A with transfers and for standing for peri hygiene.  Pt continues to require extensive 2 person A and continue to feel pt will need SNF level of care at D/C.    Follow Up Recommendations  SNF     Equipment Recommendations  None recommended by PT    Recommendations for Other Services       Precautions / Restrictions Precautions Precautions: Fall Restrictions Weight Bearing Restrictions: No    Mobility  Bed Mobility Overal bed mobility: Needs Assistance;+2 for physical assistance Bed Mobility: Supine to Sit     Supine to sit: Mod assist;HOB elevated     General bed mobility comments: pt sitting in recliner.  Transfers Overall transfer level: Needs assistance Equipment used: Ambulation equipment used Transfers: Sit to/from Stand Sit to Stand: Max assist;+2 physical assistance   Squat pivot transfers: Total assist;+2 physical assistance     General transfer comment: Omar DolphinUtilized Stedy for coming to standing and transfering to/from 3-in-1.  pt continues to require 2 person A even with stedy.  Facilitation for hip extension to close flaps on stedy.  Ambulation/Gait                 Stairs            Wheelchair Mobility    Modified Rankin (Stroke  Patients Only)       Balance Overall balance assessment: Needs assistance;History of Falls Sitting-balance support: Bilateral upper extremity supported;Feet supported Sitting balance-Leahy Scale: Poor   Postural control: Posterior lean Standing balance support: Bilateral upper extremity supported;During functional activity Standing balance-Leahy Scale: Zero                      Cognition Arousal/Alertness: Awake/alert Behavior During Therapy: WFL for tasks assessed/performed Overall Cognitive Status: History of cognitive impairments - at baseline                      Exercises     General Comments        Pertinent Vitals/Pain Pain Assessment: Faces Faces Pain Scale: Hurts little more Pain Location: Grimaces with L knee movement. Pain Descriptors / Indicators: Grimacing Pain Intervention(s): Monitored during session;Premedicated before session;Repositioned    Home Living                      Prior Function            PT Goals (current goals can now be found in the care plan section) Acute Rehab PT Goals Patient Stated Goal: Go Home. PT Goal Formulation: With family Time For Goal Achievement: 03/27/16 Potential to Achieve Goals: Fair Progress towards PT goals: Progressing toward goals    Frequency  Min 2X/week  PT Plan Current plan remains appropriate    Co-evaluation             End of Session Equipment Utilized During Treatment: Gait belt;Oxygen Activity Tolerance: Patient limited by fatigue Patient left: in chair;with call bell/phone within reach;with chair alarm set;with family/visitor present     Time: [id number] PT Time Calculation (min) (ACUTE ONLY): 25 min  Charges:  $Therapeutic Activity: 23-37 mins                    G CodesSunny Chan:      Omar Chan, [place]PT [phone] 03/15/2016, 9:56 AM

## 2016-03-15 NOTE — Discharge Summary (Signed)
Physician Discharge Summary   Patient ID: Omar Chan MRN: [id number] DOB/AGE: 03-25-1926 82 y.o.  Admit date: 03/11/2016 Discharge date: 03/15/2016  Primary Care Physician:  [name],[name], MD  Discharge Diagnoses:      . Acute respiratory failure with hypoxia (HCC) . Acute encephalopathy . Dementia . Dyslipidemia . Chronic atrial fibrillation (HCC) . Anemia . Hypertension . Bilateral carotid bruits . GERD (gastroesophageal reflux disease) .  obesity   Consults:  Cardiology  Recommendations for Outpatient Follow-up:  1. PT recommended skilled nursing facility 2. Please repeat CBC/BMET at next visit   DIET: Heart healthy diet    Allergies:   Allergies  Allergen Reactions  . Aspirin Hives  . Trazodone And Nefazodone Other (See Comments)    "hyperactivity"     DISCHARGE MEDICATIONS: Current Discharge Medication List    START taking these medications   Details  metoprolol succinate (TOPROL-XL) 25 MG 24 hr tablet Take 1.5 tablets (37.5 mg total) by mouth daily. Qty: 30 tablet, Refills: 3    potassium chloride SA (K-DUR,KLOR-CON) 20 MEQ tablet Take 2 tablets (40 mEq total) by mouth daily. Qty: 60 tablet, Refills: 0    spironolactone (ALDACTONE) 25 MG tablet Take 0.5 tablets (12.5 mg total) by mouth daily. Qty: 30 tablet, Refills: 3      CONTINUE these medications which have CHANGED   Details  furosemide (LASIX) 40 MG tablet Take 1 tablet (40 mg total) by mouth 2 (two) times daily. Qty: 60 tablet, Refills: 3      CONTINUE these medications which have NOT CHANGED   Details  acetaminophen (TYLENOL) 500 MG tablet Take 1,000 mg by mouth every 6 (six) hours as needed. For pain    atorvastatin (LIPITOR) 40 MG tablet Take 40 mg by mouth daily.    clopidogrel (PLAVIX) 75 MG tablet Take 1 tablet (75 mg total) by mouth daily. Qty: 90 tablet, Refills: 1    glimepiride (AMARYL) 1 MG tablet Take 1 mg by mouth daily.    lisinopril (PRINIVIL,ZESTRIL) 40 MG  tablet Take 40 mg by mouth every morning.     pantoprazole (PROTONIX) 40 MG tablet Take 40 mg by mouth 2 (two) times daily.    tiotropium (SPIRIVA) 18 MCG inhalation capsule Place 18 mcg into inhaler and inhale daily.         Brief H and P: For complete details please refer to admission H and P, but in brief 82 y.o.malewith past medical history significant for chronic systolic heart failure, last 2-D echo in 08/2015 showed ejection fraction 35% with diffuse hypokinesis A. fib on Plavix,hypertension, dementia who [facility] [place] with altered mental status with worsening shortness of breath at rest, fatigue and lower extremity edema. Patient was found to be hypoxic on the admission with oxygen saturation of 88% on room air but this has improved to 91-99% with nasal cannula oxygen support. Chest x-ray was significant for congestive heart failure with pulmonary interstitial edema and bilateral pleural effusions. CT head and cervical spine showed degenerative changes without acute intracranial findings. Patient was started on IV Lasix for acute decompensated CHF.  Hospital Course:  Acute respiratory failure with hypoxia (HCC) / acute on chronic combined systolic and diastolic congestive heart failure- Based on severity of symptoms at the time of the admission CHF likely NYHA grade 4 as pt have symptoms of shortness of breath and fatigue at rest -  BNP on this admission 591 - 2-D echo with EF of 30-35% with diffuse hypokinesis, grade 2  diastolic dysfunction, moderate pulmonary hypertension, trivial pericardial effusion  -Improving, transitioned back to oral Lasix - Negative balance of 4.2 L weight down from 241 on admission to 230lbs upon dc - Outpatient cardiology follow-up scheduled   A-fib (HCC) - CHADS vasc score 5 - Continue plavix - Rate controlled. Not a candidate for anticoagulation per outpatient cardiology notes Evert Kohl, GI bleeding history, frequent falls).  Cardiology placed patient on Toprol-XL, 37.5 mg daily  Hypokalemia -Stable   Acute encephalopathy / history of dementia without behavioral disturbance  - Altered mental status likely dementia, currently improved, at his baseline mental status confirmed by his family.   Essential hypertension - Continue diltiazem, lisinopril  Dyslipidemia associated with type 2 diabetes mellitus - Continue statin therapy   Diabetes mellitus without complications without long-term insulin use - on Amaryl at home, in hospital , patient was placed on sliding scale insulin   Generalized weakness and debility, falls -Per wife, difficult to take care of the patient at home with his weakness. -PT recommended skilled nursing facility, patient stable for discharge per cardiology.  Day of Discharge BP 111/89   Pulse (!) 117   Temp 97.6 F (36.4 C) (Axillary)   Resp 19   Ht 6' (1.829 m)   Wt 104.7 kg (230 lb 12.8 oz)   SpO2 96%   BMI 31.30 kg/m   Physical Exam: General: Alert and awake oriented x3 not in any acute distress. HEENT: anicteric sclera, pupils reactive to light and accommodation CVS: S1-S2 clear no murmur rubs or gallops Chest: clear to auscultation bilaterally, no wheezing rales or rhonchi Abdomen: soft nontender, nondistended, normal bowel sounds Extremities: no cyanosis, clubbing or edema noted bilaterally Neuro: Cranial nerves II-XII intact, no focal neurological deficits   The results of significant diagnostics from this hospitalization (including imaging, microbiology, ancillary and laboratory) are listed below for reference.    LAB RESULTS: Basic Metabolic Panel:  Recent Labs Lab 03/13/16 0321 03/14/16 0243  NA 144 143  K 3.5 3.7  CL 106 101  CO2 30 34*  GLUCOSE 105* 141*  BUN 17 23*  CREATININE 0.99 1.20  CALCIUM 8.6* 8.5*   Liver Function Tests:  Recent Labs Lab 03/11/16 0757  AST 18  ALT 16*  ALKPHOS 80  BILITOT 1.0  PROT 6.0*  ALBUMIN 3.6   No  results for input(s): LIPASE, AMYLASE in the last 168 hours. No results for input(s): AMMONIA in the last 168 hours. CBC:  Recent Labs Lab 03/11/16 0757  03/13/16 0321 03/14/16 0243  WBC 6.6  < > 7.6 8.4  NEUTROABS 5.6  --   --   --   HGB 12.7*  < > 12.7* 12.1*  HCT 40.7  < > 40.5 39.1  MCV 101.0*  < > 101.3* 101.8*  PLT 199  < > 204 223  < > = values in this interval not displayed. Cardiac Enzymes: No results for input(s): CKTOTAL, CKMB, CKMBINDEX, TROPONINI in the last 168 hours. BNP: Invalid input(s): POCBNP CBG:  Recent Labs Lab 03/15/16 0617 03/15/16 1104  GLUCAP 141* 148*    Significant Diagnostic Studies:  Dg Chest 2 View  Result Date: 03/11/2016 CLINICAL DATA:  Fall. EXAM: CHEST  2 VIEW COMPARISON:  09/02/2013 . FINDINGS: Cardiomegaly with pulmonary vascular prominence and bilateral interstitial prominence consistent congestive heart failure. Bilateral pleural effusions. No pneumothorax . IMPRESSION: Congestive heart failure with pulmonary interstitial edema and bilateral pleural effusions. Electronically Signed   By: Maisie Fus  Register   On: 03/11/2016 07:48  Ct Head Wo Contrast  Result Date: 03/11/2016 CLINICAL DATA:  Recent fall with headaches and neck pain, initial encounter EXAM: CT HEAD WITHOUT CONTRAST CT CERVICAL SPINE WITHOUT CONTRAST TECHNIQUE: Multidetector CT imaging of the head and cervical spine was performed following the standard protocol without intravenous contrast. Multiplanar CT image reconstructions of the cervical spine were also generated. COMPARISON:  09/12/2013 FINDINGS: CT HEAD FINDINGS Bony calvarium is intact. Mild atrophic and ischemic changes are again seen. No findings to suggest acute hemorrhage, acute infarction or space-occupying mass lesion are noted for age CT CERVICAL SPINE FINDINGS Seven cervical segments are well visualized. Vertebral body height is well maintained. Disc space narrowing is noted hole C6-7 with osteophytic changes from  C5 to T2. No acute fracture or acute facet abnormality is noted. Small pleural effusions are noted bilaterally. IMPRESSION: CT of the head: Chronic atrophic and ischemic changes. CT of the cervical spine: Degenerative changes without acute bony abnormality Bilateral pleural effusions. Electronically Signed   By: Alcide CleverMark  Lukens M.D.   On: 03/11/2016 09:22   Ct Cervical Spine Wo Contrast  Result Date: 03/11/2016 CLINICAL DATA:  Recent fall with headaches and neck pain, initial encounter EXAM: CT HEAD WITHOUT CONTRAST CT CERVICAL SPINE WITHOUT CONTRAST TECHNIQUE: Multidetector CT imaging of the head and cervical spine was performed following the standard protocol without intravenous contrast. Multiplanar CT image reconstructions of the cervical spine were also generated. COMPARISON:  09/12/2013 FINDINGS: CT HEAD FINDINGS Bony calvarium is intact. Mild atrophic and ischemic changes are again seen. No findings to suggest acute hemorrhage, acute infarction or space-occupying mass lesion are noted for age CT CERVICAL SPINE FINDINGS Seven cervical segments are well visualized. Vertebral body height is well maintained. Disc space narrowing is noted hole C6-7 with osteophytic changes from C5 to T2. No acute fracture or acute facet abnormality is noted. Small pleural effusions are noted bilaterally. IMPRESSION: CT of the head: Chronic atrophic and ischemic changes. CT of the cervical spine: Degenerative changes without acute bony abnormality Bilateral pleural effusions. Electronically Signed   By: Alcide CleverMark  Lukens M.D.   On: 03/11/2016 09:22   Dg Knee Complete 4 Views Left  Result Date: 03/11/2016 CLINICAL DATA:  False morning with knee pain, initial encounter EXAM: LEFT KNEE - COMPLETE 4+ VIEW COMPARISON:  None. FINDINGS: Knee prosthesis is noted. No acute fracture or loosening is identified. No joint effusion or soft tissue abnormality is seen. IMPRESSION: Status post knee replacement.  No acute abnormality noted.  Electronically Signed   By: Alcide CleverMark  Lukens M.D.   On: 03/11/2016 13:20    2D ECHO: Study Conclusions  - Left ventricle: The cavity size was normal. There was mild focal   basal hypertrophy of the septum. Systolic function was moderately   to severely reduced. The estimated ejection fraction was in the   range of 30% to 35%. Diffuse hypokinesis. Features are consistent   with a pseudonormal left ventricular filling pattern, with   concomitant abnormal relaxation and increased filling pressure   (grade 2 diastolic dysfunction). - Ventricular septum: The contour showed diastolic flattening. - Aortic valve: Trileaflet; moderately thickened, mildly calcified   leaflets. There was mild regurgitation. - Left atrium: The atrium was moderately dilated. - Right ventricle: The cavity size was mildly dilated. Wall   thickness was normal. Systolic function was mildly reduced. - Right atrium: The atrium was mildly dilated. - Pulmonary arteries: Systolic pressure was moderately increased.   PA peak pressure: 60 mm Hg (S). - Pericardium, extracardiac: A trivial pericardial  effusion was   identified circumferential to the heart. Features were not   consistent with tamponade physiology.  Disposition and Follow-up: Discharge Instructions    (HEART FAILURE PATIENTS) Call MD:  Anytime you have any of the following symptoms: 1) 3 pound weight gain in 24 hours or 5 pounds in 1 week 2) shortness of breath, with or without a dry hacking cough 3) swelling in the hands, feet or stomach 4) if you have to sleep on extra pillows at night in order to breathe.    Complete by:  As directed   Diet - low sodium heart healthy    Complete by:  As directed   Increase activity slowly    Complete by:  As directed       DISPOSITION: SNF   DISCHARGE FOLLOW-UP Follow-up Information    Mickie Hillier, MD. Schedule an appointment as soon as possible for a visit in 2 week(s).   Specialty:  Family Medicine Contact  information: [address] [place] [address] [phone]        Cline Crock, PA-C. Go on 03/22/2016.   Specialties:  Cardiology, Radiology Why:  @ 3pm ( please arrive 15 minutes early )  Contact information: [address] [place] [address] [phone]            Time spent on Discharge:   Signed:   Lily Kernen M.D. [name] 03/15/2016, 11:21 AM Pager: [phone]

## 2016-03-15 NOTE — Clinical Social Work Note (Signed)
RN Report Information [facility] Report# [phone] Room# 612  [facility] Pt and family are agreeable to discharge plan. Facility has received discharge information and is ready to admit pt. RN will call report. [facility] will provide transportation. CSW is signing off as no further needs identified.   Dede QuerySarah Leetta Hendriks, MSW, LCSW  Clinical Social Worker  [phone]

## 2016-03-21 ENCOUNTER — Encounter: Payer: Self-pay | Admitting: Physician Assistant

## 2016-03-22 ENCOUNTER — Ambulatory Visit: Payer: Medicare Other | Admitting: Physician Assistant

## 2016-03-22 NOTE — Progress Notes (Signed)
Cardiology Office Note    Date:  03/25/2016   ID:  Omar Chan, DOB 1926-02-03, MRN [id number]  PCP:  Mickie Hillier, MD  Cardiologist:  Dr. Anne Fu  CC: post hospital follow up  History of Present Illness:  Omar Chan is a 82 y.o. male with a history of chronic systolic CHF, DM, GERD, HLD, HTN, and PAF (not on anticoagulation due to falls and GI Bleed) who presents to clinic for post hospital follow up.   He recently presented to the [facility] on 03/11/16 via EMS after being found outside sitting beside his car door. He was unsure how he got outside, there was also a large skin tear on his arm. He was admitted for further evaluation. According to his wife, the patient has had 2 weeks of worsening shortness of breath and lower extremity edema. He has been sleeping in the recliner. BNP was 591.6, chest x ray showed interstitial edema consistent with CHF. He was hypoxic and required supplemental oxygen. Echo was obtained and EF was 30-35% with diffuse hypokinesis, also had grade 2 diastolic dysfunction. PA pressure was increased at . EF was 35-40% in February of this year. He was also found to be in afib with RVR and diltiazem was started for rate control. This was later discontinued in the setting of LV dysfunction and he was started on Torpol XL which was titrated to 37.5mg  daily. Aldactone was also added during this admission. His discharge weight was 230lbs. He was discharged to a SNF due to debilitation and significant knee pain.   Today he presents to clinic for follow up. He has been doing just okay. Still very fatigued and weak. Working with PT. Review of meds given show lasix was stopped 8/24 for unknown reasons. His legs are starting to swell up again. BP is low today. He denies " anymore dizziness than usual." No syncope. NO SOB or CP. NO blood in stool or urine.      Past Medical History:  Diagnosis Date  . Acute on chronic systolic CHF (congestive heart failure)  (HCC)   . CHF (congestive heart failure) (HCC)   . Dementia   . Diabetes mellitus   . Exogenous obesity   . GERD (gastroesophageal reflux disease)   . Hiatal hernia   . Hyperlipidemia   . Hypertension   . Microcytic anemia   . Osteoarthritis   . Paroxysmal atrial fibrillation (HCC)    cardioversion 01/25/10    Past Surgical History:  Procedure Laterality Date  . CARDIOVASCULAR STRESS TEST  2007   No ischemia. EF 61%  . CARDIOVERSION  01/25/10  . CARDIOVERSION N/A 08/19/2013   Procedure: CARDIOVERSION;  Surgeon: Cassell Clement, MD;  Location: [facility] ENDOSCOPY;  Service: Cardiovascular;  Laterality: N/A;  . ESOPHAGOGASTRODUODENOSCOPY N/A 06/02/2014   Procedure: ESOPHAGOGASTRODUODENOSCOPY (EGD);  Surgeon: Barrie Folk, MD;  Location: [facility] ENDOSCOPY;  Service: Endoscopy;  Laterality: N/A;  . TOTAL KNEE ARTHROPLASTY     B  . US ECHOCARDIOGRAPHY  February 2011   Normal EF; LAE and RVE    Current Medications: Outpatient Medications Prior to Visit  Medication Sig Dispense Refill  . acetaminophen (TYLENOL) 500 MG tablet Take 1,000 mg by mouth every 6 (six) hours as needed. For pain    . atorvastatin (LIPITOR) 40 MG tablet Take 40 mg by mouth daily.    . clopidogrel (PLAVIX) 75 MG tablet Take 1 tablet (75 mg total) by mouth daily. 90 tablet 1  . glimepiride (AMARYL) 1 MG tablet  Take 1 mg by mouth daily.    Marland Kitchen lisinopril (PRINIVIL,ZESTRIL) 40 MG tablet Take 40 mg by mouth every morning.     . metoprolol succinate (TOPROL-XL) 25 MG 24 hr tablet Take 1.5 tablets (37.5 mg total) by mouth daily. 30 tablet 3  . pantoprazole (PROTONIX) 40 MG tablet Take 40 mg by mouth 2 (two) times daily.    . potassium chloride SA (K-DUR,KLOR-CON) 20 MEQ tablet Take 2 tablets (40 mEq total) by mouth daily. 60 tablet 0  . spironolactone (ALDACTONE) 25 MG tablet Take 0.5 tablets (12.5 mg total) by mouth daily. 30 tablet 3  . tiotropium (SPIRIVA) 18 MCG inhalation capsule Place 18 mcg into inhaler and inhale daily.      . furosemide (LASIX) 40 MG tablet Take 1 tablet (40 mg total) by mouth 2 (two) times daily. 60 tablet 3   No facility-administered medications prior to visit.      Allergies:   Aspirin and Trazodone and nefazodone   Social History   Social History  . Marital status: Married    Spouse name: [name]  . Number of children: 4  . Years of education: 12   Occupational History  . Retired    Social History Main Topics  . Smoking status: Former Smoker    Quit date: 07/29/1981  . Smokeless tobacco: Never Used  . Alcohol use No  . Drug use: No  . Sexual activity: Yes   Other Topics Concern  . None   Social History Narrative   Lives with wife and daughter   Caffeine use: decaf coffee- 2-3 per day     Family History:  The patient'sfamily history includes ALS in his brother; Cancer in his brother, mother, and sister; Hypertension in his mother; Pneumonia in his father.     ROS:   Please see the history of present illness.    ROS All other systems reviewed and are negative.   PHYSICAL EXAM:   VS:  BP (!) 78/55   Pulse 83   Ht 6' (1.829 m)    GEN: Well nourished, well developed, in no acute distress in a wheelchair. Chronically ill appearing.  HEENT: normal  Neck: no JVD, carotid bruits, or masses Cardiac: irreg irreg. ; no murmurs, rubs, or gallops, 1-2+ bilateral LE edema  Respiratory:  clear to auscultation bilaterally, normal work of breathing GI: soft, nontender, nondistended, + BS MS: no deformity or atrophy  Skin: warm and dry, no rash Neuro:  Alert and Oriented x 3, Strength and sensation are intact Psych: euthymic mood, full affect  Wt Readings from Last 3 Encounters:  03/15/16 230 lb 12.8 oz (104.7 kg)  02/27/16 248 lb 12.8 oz (112.9 kg)  01/12/16 241 lb (109.3 kg)      Studies/Labs Reviewed:   EKG:  EKG is NOT ordered today.   Recent Labs: 03/11/2016: ALT 16; B Natriuretic Peptide 591.6; TSH 3.338 03/14/2016: BUN 23; Creatinine, Ser 1.20; Hemoglobin 12.1;  Platelets 223; Potassium 3.7; Sodium 143   Lipid Panel    Component Value Date/Time   CHOL 154 06/06/2011 0852   TRIG 115.0 06/06/2011 0852   HDL 48.90 06/06/2011 0852   CHOLHDL 3 06/06/2011 0852   VLDL 23.0 06/06/2011 0852   LDLCALC 82 06/06/2011 0852    Additional studies/ records that were reviewed today include:  2D Echo: 03/12/2016 Study Conclusions - Left ventricle: The cavity size was normal. There was mild focal basal hypertrophy of the septum. Systolic function was moderately to severely reduced. The  estimated ejection fraction was in the range of 30% to 35%. Diffuse hypokinesis. Features are consistent with a pseudonormal left ventricular filling pattern, with concomitant abnormal relaxation and increased filling pressure (grade 2 diastolic dysfunction). - Ventricular septum: The contour showed diastolic flattening. - Aortic valve: Trileaflet; moderately thickened, mildly calcified leaflets. There was mild regurgitation. - Left atrium: The atrium was moderately dilated. - Right ventricle: The cavity size was mildly dilated. Wall thickness was normal. Systolic function was mildly reduced. - Right atrium: The atrium was mildly dilated. - Pulmonary arteries: Systolic pressure was moderately increased. PA peak pressure: 60 mm Hg (S). - Pericardium, extracardiac: A trivial pericardial effusion was identified circumferential to the heart. Features were not consistent with tamponade physiology. Impressions: - Compared to the prior study, there has been no significant interval change.   ASSESSMENT & PLAN:   Chronic atrial fibrillation:CHADSVASc score at least 5. Not a candidate for anticoagulation per outpatient cardiology notes (elderly, GI bleeding history, frequent falls). Rate well controlled on Toprol XL 37.5mg . HR is in 80s today.  Acute on chronic systolic heart failure:he does have some mild LE edema which is worse since discharge per  daughter. SNF stopped chronic lasix 40mg  daily on 8/24 for unknown reasons. Maybe due to hypotension? BP low today with SBP in 70s. For this reason I will stop Lisinopril 40mg  daily. Continue Toprol XL 37.5 mg daily, which I am hesistant to change with recent admission with afib with RVR  HTN: now with hypotension at 78/55. Will stop Lisinopril 40mg  daily and spiro 12.5mg  daily. Continue Toprol XL 37.5mg  daily. Will check a BMET and CBC today.   Hx of TIA: continue plavix and statin.   Dispo: I have asked the SNF to keep a log of his BPs and he will return this Friday to see me from close follow up.   Medication Adjustments/Labs and Tests Ordered: Current medicines are reviewed at length with the patient today.  Concerns regarding medicines are outlined above.  Medication changes, Labs and Tests ordered today are listed in the Patient Instructions below. There are no Patient Instructions on file for this visit.   Byrd HesselbachSigned, Chaska Hagger, PA-C  03/25/2016 11:53 AM    [facility] [address], [place], [place]  [address] Phone: [phone]) [phone]; Fax: [phone]

## 2016-03-22 NOTE — Progress Notes (Deleted)
Cardiology Office Note    Date:  03/22/2016   ID:  Omar KickWillard D Sereno, DOB 01-Jun-1926, MRN [id number]  PCP:  [name],[name], MD  Cardiologist:  Dr. Caro HightSkain  CC: post hospital follow up  History of Present Illness:  Omar Chan is a 82 y.o. male with a history of chronic systolic CHF, DM, GERD, HLD, HTN, and PAF (not on anticoagulation due to falls and GI Bleed) who presents to clinic for post hospital follow up.   He recently presented [facility] on 03/11/16 via EMS after being found outside sitting beside his car door. He was unsure how he got outside, there was also a large skin tear on his arm. He was admitted for further evaluation. According to his wife, the patient has had 2 weeks of worsening shortness of breath and lower extremity edema. He has been sleeping in the recliner. BNP was 591.6, chest x ray showed interstitial edema consistent with CHF. He was hypoxic and required supplemental oxygen. Echo was obtained and EF was 30-35% with diffuse hypokinesis, also had grade 2 diastolic dysfunction. PA pressure was increased at 60mmHg. EF was 35-40% in February of this year. He was also found to be in afib with RVR and diltiazem was started for rate control. This was later discontinued in the setting of LV dysfunction and he was started on Torpol XL which was titrated to 37.5mg  daily. Aldactone was also added during this admission. His discharge weight was 230lbs. He was discharged to a SNF due to debilitation.   Today he presents to clinic for follow up.     Past Medical History:  Diagnosis Date  . Acute on chronic systolic CHF (congestive heart failure) (HCC)   . CHF (congestive heart failure) (HCC)   . Dementia   . Diabetes mellitus   . Exogenous obesity   . GERD (gastroesophageal reflux disease)   . Hiatal hernia   . Hyperlipidemia   . Hypertension   . Microcytic anemia   . Osteoarthritis   . Paroxysmal atrial fibrillation (HCC)    cardioversion 01/25/10    Past  Surgical History:  Procedure Laterality Date  . CARDIOVASCULAR STRESS TEST  2007   No ischemia. EF 61%  . CARDIOVERSION  01/25/10  . CARDIOVERSION N/A 08/19/2013   Procedure: CARDIOVERSION;  Surgeon: Cassell Clementhomas Brackbill, MD;  [facility];  Service: Cardiovascular;  Laterality: N/A;  . ESOPHAGOGASTRODUODENOSCOPY N/A 06/02/2014   Procedure: ESOPHAGOGASTRODUODENOSCOPY (EGD);  Surgeon: Barrie FolkJohn C Hayes, MD;  [facility];  Service: Endoscopy;  Laterality: N/A;  . TOTAL KNEE ARTHROPLASTY     B  . US ECHOCARDIOGRAPHY  February 2011   Normal EF; LAE and RVE    Current Medications: Outpatient Medications Prior to Visit  Medication Sig Dispense Refill  . acetaminophen (TYLENOL) 500 MG tablet Take 1,000 mg by mouth every 6 (six) hours as needed. For pain    . atorvastatin (LIPITOR) 40 MG tablet Take 40 mg by mouth daily.    . clopidogrel (PLAVIX) 75 MG tablet Take 1 tablet (75 mg total) by mouth daily. 90 tablet 1  . furosemide (LASIX) 40 MG tablet Take 1 tablet (40 mg total) by mouth 2 (two) times daily. 60 tablet 3  . glimepiride (AMARYL) 1 MG tablet Take 1 mg by mouth daily.    Marland Kitchen. lisinopril (PRINIVIL,ZESTRIL) 40 MG tablet Take 40 mg by mouth every morning.     . metoprolol succinate (TOPROL-XL) 25 MG 24 hr tablet Take 1.5 tablets (37.5  mg total) by mouth daily. 30 tablet 3  . pantoprazole (PROTONIX) 40 MG tablet Take 40 mg by mouth 2 (two) times daily.    . potassium chloride SA (K-DUR,KLOR-CON) 20 MEQ tablet Take 2 tablets (40 mEq total) by mouth daily. 60 tablet 0  . spironolactone (ALDACTONE) 25 MG tablet Take 0.5 tablets (12.5 mg total) by mouth daily. 30 tablet 3  . tiotropium (SPIRIVA) 18 MCG inhalation capsule Place 18 mcg into inhaler and inhale daily.     No facility-administered medications prior to visit.      Allergies:   Aspirin and Trazodone and nefazodone   Social History   Social History  . Marital status: Married    Spouse name: [name]  . Number of  children: 4  . Years of education: 12   Occupational History  . Retired    Social History Main Topics  . Smoking status: Former Smoker    Quit date: 07/29/1981  . Smokeless tobacco: Never Used  . Alcohol use No  . Drug use: No  . Sexual activity: Yes   Other Topics Concern  . Not on file   Social History Narrative   Lives with wife and daughter   Caffeine use: decaf coffee- 2-3 per day     Family History:  The patient's ***family history includes ALS in his brother; Cancer in his brother, mother, and sister; Hypertension in his mother; Pneumonia in his father.     ROS:   Please see the history of present illness.    ROS All other systems reviewed and are negative.   PHYSICAL EXAM:   VS:  There were no vitals taken for this visit.   GEN: Well nourished, well developed, in no acute distress  HEENT: normal  Neck: no JVD, carotid bruits, or masses Cardiac: ***RRR; no murmurs, rubs, or gallops,no edema  Respiratory:  clear to auscultation bilaterally, normal work of breathing GI: soft, nontender, nondistended, + BS MS: no deformity or atrophy  Skin: warm and dry, no rash Neuro:  Alert and Oriented x 3, Strength and sensation are intact Psych: euthymic mood, full affect  Wt Readings from Last 3 Encounters:  03/15/16 230 lb 12.8 oz (104.7 kg)  02/27/16 248 lb 12.8 oz (112.9 kg)  01/12/16 241 lb (109.3 kg)      Studies/Labs Reviewed:   EKG:  EKG is NOT ordered today.    Recent Labs: 03/11/2016: ALT 16; B Natriuretic Peptide 591.6; TSH 3.338 03/14/2016: BUN 23; Creatinine, Ser 1.20; Hemoglobin 12.1; Platelets 223; Potassium 3.7; Sodium 143   Lipid Panel    Component Value Date/Time   CHOL 154 06/06/2011 0852   TRIG 115.0 06/06/2011 0852   HDL 48.90 06/06/2011 0852   CHOLHDL 3 06/06/2011 0852   VLDL 23.0 06/06/2011 0852   LDLCALC 82 06/06/2011 0852    Additional studies/ records that were reviewed today include:  2D Echo: 03/12/2016 Study Conclusions - Left  ventricle: The cavity size was normal. There was mild focal basal hypertrophy of the septum. Systolic function was moderately to severely reduced. The estimated ejection fraction was in the range of 30% to 35%. Diffuse hypokinesis. Features are consistent with a pseudonormal left ventricular filling pattern, with concomitant abnormal relaxation and increased filling pressure (grade 2 diastolic dysfunction). - Ventricular septum: The contour showed diastolic flattening. - Aortic valve: Trileaflet; moderately thickened, mildly calcified leaflets. There was mild regurgitation. - Left atrium: The atrium was moderately dilated. - Right ventricle: The cavity size was mildly dilated. Wall thickness  was normal. Systolic function was mildly reduced. - Right atrium: The atrium was mildly dilated. - Pulmonary arteries: Systolic pressure was moderately increased. PA peak pressure: 60 mm Hg (S). - Pericardium, extracardiac: A trivial pericardial effusion was identified circumferential to the heart. Features were not consistent with tamponade physiology. Impressions: - Compared to the prior study, there has been no significant interval change.   ASSESSMENT & PLAN:   Chronic atrial fibrillation: CHADSVASc score at least 5. Not a candidate for anticoagulation per outpatient cardiology notes (elderly, GI bleeding history, frequent falls). Rate well controlled on Toprol XL 37.5mg .   Acute on chronic systolic heart failure: does not appear volume overloaded on exam today. Echo reviewed as above. Continue Toprol XL, lisinopril.   HTN: BP well controlled currently  Hx of TIA: continue plavix and statin.   Debilitation: prior to admission he was fine walking with a walker. He has significant knee pain after fall and wife doesn't think she is going to be able to take care of him. PT is recommending SNF placement.  Will defer to primary team. Okay to discharge from a cardiac  perspective. I will arrange for 1 week TOC appointment.    Medication Adjustments/Labs and Tests Ordered: Current medicines are reviewed at length with the patient today.  Concerns regarding medicines are outlined above.  Medication changes, Labs and Tests ordered today are listed in the Patient Instructions below. There are no Patient Instructions on file for this visit.   Signed, Cline Crock, PA-C  03/22/2016 1:49 PM    [facility] [address] [place], [place], [place]  [address] Phone: [phone]; Fax: [phone]

## 2016-03-25 ENCOUNTER — Encounter: Payer: Self-pay | Admitting: Physician Assistant

## 2016-03-25 ENCOUNTER — Ambulatory Visit (INDEPENDENT_AMBULATORY_CARE_PROVIDER_SITE_OTHER): Payer: Medicare Other | Admitting: Physician Assistant

## 2016-03-25 ENCOUNTER — Telehealth: Payer: Self-pay | Admitting: Neurology

## 2016-03-25 ENCOUNTER — Institutional Professional Consult (permissible substitution): Payer: Medicare Other | Admitting: Neurology

## 2016-03-25 VITALS — BP 78/55 | HR 83 | Ht 72.0 in

## 2016-03-25 DIAGNOSIS — I480 Paroxysmal atrial fibrillation: Secondary | ICD-10-CM

## 2016-03-25 DIAGNOSIS — I482 Chronic atrial fibrillation, unspecified: Secondary | ICD-10-CM

## 2016-03-25 DIAGNOSIS — Z8673 Personal history of transient ischemic attack (TIA), and cerebral infarction without residual deficits: Secondary | ICD-10-CM | POA: Diagnosis not present

## 2016-03-25 DIAGNOSIS — I5023 Acute on chronic systolic (congestive) heart failure: Secondary | ICD-10-CM | POA: Diagnosis not present

## 2016-03-25 DIAGNOSIS — I959 Hypotension, unspecified: Secondary | ICD-10-CM

## 2016-03-25 LAB — BASIC METABOLIC PANEL
BUN: 48 mg/dL — AB (ref 7–25)
CHLORIDE: 103 mmol/L (ref 98–110)
CO2: 31 mmol/L (ref 20–31)
CREATININE: 1.52 mg/dL — AB (ref 0.70–1.11)
Calcium: 8.7 mg/dL (ref 8.6–10.3)
GLUCOSE: 166 mg/dL — AB (ref 65–99)
Potassium: 4.5 mmol/L (ref 3.5–5.3)
Sodium: 142 mmol/L (ref 135–146)

## 2016-03-25 LAB — CBC
HCT: 38.7 % (ref 38.5–50.0)
Hemoglobin: 12.8 g/dL — ABNORMAL LOW (ref 13.2–17.1)
MCH: 31.4 pg (ref 27.0–33.0)
MCHC: 33.1 g/dL (ref 32.0–36.0)
MCV: 94.9 fL (ref 80.0–100.0)
MPV: 11.4 fL (ref 7.5–12.5)
PLATELETS: 286 10*3/uL (ref 140–400)
RBC: 4.08 MIL/uL — ABNORMAL LOW (ref 4.20–5.80)
RDW: 13.8 % (ref 11.0–15.0)
WBC: 9 10*3/uL (ref 3.8–10.8)

## 2016-03-25 NOTE — Telephone Encounter (Signed)
Review of chart shows he just had CT Head on 03/11/16.  Ok, per Dr. Lucia GaskinsAhern, that his scan here has been canceled.  Pt's wife aware.

## 2016-03-25 NOTE — Patient Instructions (Signed)
Medication Instructions:  Your physician has recommended you make the following change in your medication:  1.  STOP LISINOPRIL 2.  STOP SPIRONOLACTONE   Labwork: TODAY;  BMET & CBC  Testing/Procedures: None ordered  Follow-Up: Your physician recommends that you schedule a follow-up appointment in: 03/29/16 WITH KATIE THOMPSON, PA-C   Any Other Special Instructions Will Be Listed Below (If Applicable).  PLEASE HAVE THE NURSING FACILITY CHECK YOUR BLOOD PRESSURE DAILY AND KEEP A LOG OF IT AND SEND IT WITH YOU ON Friday.   If you need a refill on your cardiac medications before your next appointment, please call your pharmacy.

## 2016-03-25 NOTE — Telephone Encounter (Signed)
Pt's wife called in stating CT had to be cancelled due to pt falling. He is currently in Rehab and she wants to know if Dr. Lucia GaskinsAhern still wants CT. Please call and advise [phone]

## 2016-03-28 NOTE — Progress Notes (Signed)
ID:  Omar Chan, DOB 06-22-1926, MRN [id number]  PCP:  Mickie Hillier, MD  Cardiologist:  Dr. Anne Fu  CC:  follow up  History of Present Illness:  Omar Chan is a 82 y.o. male with a history of chronic systolic CHF, DM, GERD, HLD, HTN, and chronic atrial fibrillation (not on anticoagulation due to falls and GI Bleed) who presents to clinic for follow up.   He recently presented to the [facility] ED on 03/11/16 via EMS after being found outside sitting beside his car door. He was unsure how he got outside, there was also a large skin tear on his arm. He was admitted for further evaluation. According to his wife, the patient has had 2 weeks of worsening shortness of breath and lower extremity edema. He has been sleeping in the recliner. BNP was 591.6, chest x ray showed interstitial edema consistent with CHF. He was hypoxic and required supplemental oxygen. Echo was obtained and EF was 30-35% with diffuse hypokinesis, also had grade 2 diastolic dysfunction. PA pressure was increased at . EF was 35-40% in February of this year. He was also found to be in afib with RVR and diltiazem was started for rate control. This was later discontinued in the setting of LV dysfunction and he was started on Torpol XL which was titrated to 37.5mg  daily. Aldactone was also added during this admission. His discharge weight was 230lbs. He was discharged to a SNF due to debilitation and significant knee pain.   I saw him in clinic on 03/25/16 for post hospital follow up. He was hypotensive with SBP in 70s and feeling weak. I discontinued his spiro and lisinopril. He also had worsening LE edema and lasix was being held at [facility]. I resumed lasix 40mg  daily. Lab work showed worsening renal function from 1.20 to 1.52 and blood counts were stable.  Today he presents to clinic for close follow up. He has been doing okay. Just weak. No CP, SOB, orthopnea, PND, palpitations or dizziness. He just wants to go home. LE  edema is better.     Past Medical History:  Diagnosis Date  . Acute on chronic systolic CHF (congestive heart failure) (HCC)   . CHF (congestive heart failure) (HCC)   . Dementia   . Diabetes mellitus   . Exogenous obesity   . GERD (gastroesophageal reflux disease)   . Hiatal hernia   . Hyperlipidemia   . Hypertension   . Microcytic anemia   . Osteoarthritis   . Paroxysmal atrial fibrillation (HCC)    cardioversion 01/25/10    Past Surgical History:  Procedure Laterality Date  . CARDIOVASCULAR STRESS TEST  2007   No ischemia. EF 61%  . CARDIOVERSION  01/25/10  . CARDIOVERSION N/A 08/19/2013   Procedure: CARDIOVERSION;  Surgeon: Cassell Clement, MD;  Location: [facility] ENDOSCOPY;  Service: Cardiovascular;  Laterality: N/A;  . ESOPHAGOGASTRODUODENOSCOPY N/A 06/02/2014   Procedure: ESOPHAGOGASTRODUODENOSCOPY (EGD);  Surgeon: Barrie Folk, MD;  Location: [facility] ENDOSCOPY;  Service: Endoscopy;  Laterality: N/A;  . TOTAL KNEE ARTHROPLASTY     B  . US ECHOCARDIOGRAPHY  February 2011   Normal EF; LAE and RVE    Current Medications: Outpatient Medications Prior to Visit  Medication Sig Dispense Refill  . acetaminophen (TYLENOL) 500 MG tablet Take 1,000 mg by mouth every 6 (six) hours as needed. For pain    . atorvastatin (LIPITOR) 40 MG tablet Take 40 mg by mouth daily.    . clopidogrel (PLAVIX) 75 MG  tablet Take 1 tablet (75 mg total) by mouth daily. 90 tablet 1  . glimepiride (AMARYL) 1 MG tablet Take 1 mg by mouth daily.    . metoprolol succinate (TOPROL-XL) 25 MG 24 hr tablet Take 1.5 tablets (37.5 mg total) by mouth daily. 30 tablet 3  . pantoprazole (PROTONIX) 40 MG tablet Take 40 mg by mouth 2 (two) times daily.    . potassium chloride SA (K-DUR,KLOR-CON) 20 MEQ tablet Take 2 tablets (40 mEq total) by mouth daily. 60 tablet 0  . tiotropium (SPIRIVA) 18 MCG inhalation capsule Place 18 mcg into inhaler and inhale daily.     No facility-administered medications prior to visit.       Allergies:   Aspirin and Trazodone and nefazodone   Social History   Social History  . Marital status: Married    Spouse name: Omar Chan  . Number of children: 4  . Years of education: 12   Occupational History  . Retired    Social History Main Topics  . Smoking status: Former Smoker    Quit date: 07/29/1981  . Smokeless tobacco: Never Used  . Alcohol use No  . Drug use: No  . Sexual activity: Yes   Other Topics Concern  . None   Social History Narrative   Lives with wife and daughter   Caffeine use: decaf coffee- 2-3 per day     Family History:  The patient's family history includes ALS in his brother; Cancer in his brother, mother, and sister; Hypertension in his mother; Pneumonia in his father.     ROS:   Please see the history of present illness.    ROS  All other systems reviewed and are negative.   PHYSICAL EXAM:   VS:  BP (!) 84/50   Pulse 82   Ht 6' (1.829 m)   Wt 224 lb 12.8 oz (102 kg)   SpO2 97%   BMI 30.49 kg/m    GEN: Well nourished, well developed, in no acute distress  HEENT: normal  Neck: no JVD, carotid bruits, or masses Cardiac: irreg irreg; no murmurs, rubs, or gallops,no edema  Respiratory:  clear to auscultation bilaterally, normal work of breathing GI: soft, nontender, nondistended, + BS MS: no deformity or atrophy  Skin: warm and dry, no rash Neuro:  Alert and Oriented x 3, Strength and sensation are intact Psych: euthymic mood, full affect  Wt Readings from Last 3 Encounters:  03/29/16 224 lb 12.8 oz (102 kg)  03/15/16 230 lb 12.8 oz (104.7 kg)  02/27/16 248 lb 12.8 oz (112.9 kg)      Studies/Labs Reviewed:   EKG:  EKG is NOT ordered today.    Recent Labs: 03/11/2016: ALT 16; B Natriuretic Peptide 591.6; TSH 3.338 03/25/2016: BUN 48; Creat 1.52; Hemoglobin 12.8; Platelets 286; Potassium 4.5; Sodium 142   Lipid Panel    Component Value Date/Time   CHOL 154 06/06/2011 0852   TRIG 115.0 06/06/2011 0852   HDL 48.90 06/06/2011  0852   CHOLHDL 3 06/06/2011 0852   VLDL 23.0 06/06/2011 0852   LDLCALC 82 06/06/2011 0852    Additional studies/ records that were reviewed today include:  2D Echo: 03/12/2016 Study Conclusions - Left ventricle: The cavity size was normal. There was mild focal basal hypertrophy of the septum. Systolic function was moderately to severely reduced. The estimated ejection fraction was in the range of 30% to 35%. Diffuse hypokinesis. Features are consistent with a pseudonormal left ventricular filling pattern, with concomitant abnormal relaxation  and increased filling pressure (grade 2 diastolic dysfunction). - Ventricular septum: The contour showed diastolic flattening. - Aortic valve: Trileaflet; moderately thickened, mildly calcified leaflets. There was mild regurgitation. - Left atrium: The atrium was moderately dilated. - Right ventricle: The cavity size was mildly dilated. Wall thickness was normal. Systolic function was mildly reduced. - Right atrium: The atrium was mildly dilated. - Pulmonary arteries: Systolic pressure was moderately increased. PA peak pressure: 60 mm Hg (S). - Pericardium, extracardiac: A trivial pericardial effusion was identified circumferential to the heart. Features were not consistent with tamponade physiology. Impressions: - Compared to the prior study, there has been no significant interval change.   ASSESSMENT & PLAN:   Chronic atrial fibrillation:CHADSVASc score at least 5. Not a candidate for anticoagulation per prior cardiology notes (elderly, GI bleeding history, frequent falls). Rate well controlled on Toprol XL 37.5mg . HR is in 80s today.  Chronic systolic heart failure:LE edema better. He is still hypotensive so will decrease lasix 40mg  to QOD. Continue Toprol XL 37.5 mg daily   HTN: BP 84/50 today. Continue Toprol XL 37.5mg  daily which I am hesitant to reduce with recent admission for afib with RVR. As above, will  change lasix to every other day.   Hx of TIA: continue plavix and statin.   AKI: creat went up to 1.5 I think due to dehydration. We will drop lasix to 40 QOD due to continued hypotension.   Medication Adjustments/Labs and Tests Ordered: Current medicines are reviewed at length with the patient today.  Concerns regarding medicines are outlined above.  Medication changes, Labs and Tests ordered today are listed in the Patient Instructions below. Patient Instructions  Medication Instructions:  Your physician has recommended you make the following change in your medication:  1.  DECREASE Lasix to 40 mg taking ONLY EVERY OTHER DAY   Labwork: None ordered  Testing/Procedures: None ordered  Follow-Up: Your physician recommends that you schedule a follow-up appointment in: 04/16/16 ARRIVE AT 11:15 TO SEE KATIE Jaiyah Beining, PA-C   Any Other Special Instructions Will Be Listed Below (If Applicable).     If you need a refill on your cardiac medications before your next appointment, please call your pharmacy.      Signed, Cline Crock, PA-C  03/29/2016 12:50 PM    [facility] [address] [place], [place], [place]  [address] Phone: [phone]; Fax: [phone]

## 2016-03-29 ENCOUNTER — Ambulatory Visit (INDEPENDENT_AMBULATORY_CARE_PROVIDER_SITE_OTHER): Payer: Medicare Other | Admitting: Physician Assistant

## 2016-03-29 ENCOUNTER — Encounter: Payer: Self-pay | Admitting: Physician Assistant

## 2016-03-29 VITALS — BP 84/50 | HR 82 | Ht 72.0 in | Wt 224.8 lb

## 2016-03-29 DIAGNOSIS — Z8673 Personal history of transient ischemic attack (TIA), and cerebral infarction without residual deficits: Secondary | ICD-10-CM

## 2016-03-29 DIAGNOSIS — I482 Chronic atrial fibrillation, unspecified: Secondary | ICD-10-CM

## 2016-03-29 DIAGNOSIS — I10 Essential (primary) hypertension: Secondary | ICD-10-CM

## 2016-03-29 DIAGNOSIS — I5022 Chronic systolic (congestive) heart failure: Secondary | ICD-10-CM

## 2016-03-29 DIAGNOSIS — I959 Hypotension, unspecified: Secondary | ICD-10-CM

## 2016-03-29 MED ORDER — FUROSEMIDE 40 MG PO TABS: 40.0000 mg | ORAL_TABLET | ORAL | 3 refills | Status: DC

## 2016-03-29 NOTE — Patient Instructions (Signed)
Medication Instructions:  Your physician has recommended you make the following change in your medication:  1.  DECREASE Lasix to 40 mg taking ONLY EVERY OTHER DAY   Labwork: None ordered  Testing/Procedures: None ordered  Follow-Up: Your physician recommends that you schedule a follow-up appointment in: 04/16/16 ARRIVE AT 11:15 TO SEE KATIE THOMPSON, PA-C   Any Other Special Instructions Will Be Listed Below (If Applicable).     If you need a refill on your cardiac medications before your next appointment, please call your pharmacy.

## 2016-04-14 NOTE — Progress Notes (Signed)
ID:  Omar Chan, DOB 1926-06-10, MRN [id number]  PCP:  [name],[name], MD  Cardiologist:  Dr. Anne FuSkains  CC:  follow up  History of Present Illness:  Omar Chan is a 82 y.o. male with a history of chronic systolic CHF, DM, GERD, HLD, HTN, and chronic atrial fibrillation (not on anticoagulation due to falls and GI Bleed) who presents to clinic for follow up.   He recently was admitted 8/14- 03/15/16 for afib with RVR and A/C combined S/D CHF. Echo showed EF was 30-35% with diffuse hypokinesis, grade 2 diastolic dysfunction and PA pressure 60mmHg. He was diuresed and rate controled. His discharge weight was 230lbs. He was discharged to a SNF due to debilitation and significant knee pain.   I saw him in clinic on 03/25/16 for post hospital follow up. He was hypotensive with SBP in 70s and feeling weak. I discontinued his spiro and lisinopril. He also had worsening LE edema and [facility] I resumed lasix 40mg  daily. Lab work showed worsening renal function from 1.20 to 1.52 and blood counts were stable.  I saw him again in clinic on 03/29/16. He was still hypotensive but improved to 84/50. His LE edema was improved so I changed lasix to every other day.  Today he presents to clinic for follow up. Here with daughter. Still at SNF but going home soon. He doesn't feel "worth a hoot" . SNF increased lasix to 40mg  daily due to weight gain and LE edema. SOB and orthopnea better. No dizziness or syncope. Chronic LE edema that is stable. No chest pain. Per daughter dementia is getting worse. Mobility mostly limited by knee pain.     Past Medical History:  Diagnosis Date  . Acute on chronic systolic CHF (congestive heart failure) (HCC)   . CHF (congestive heart failure) (HCC)   . Dementia   . Diabetes mellitus   . Exogenous obesity   . GERD (gastroesophageal reflux disease)   . Hiatal hernia   . Hyperlipidemia   . Hypertension   . Microcytic anemia   . Osteoarthritis     . Paroxysmal atrial fibrillation (HCC)    cardioversion 01/25/10    Past Surgical History:  Procedure Laterality Date  . CARDIOVASCULAR STRESS TEST  2007   No ischemia. EF 61%  . CARDIOVERSION  01/25/10  . CARDIOVERSION N/A 08/19/2013   Procedure: CARDIOVERSION;  Surgeon: Cassell Clementhomas Brackbill, MD;  [facility];  Service: Cardiovascular;  Laterality: N/A;  . ESOPHAGOGASTRODUODENOSCOPY N/A 06/02/2014   Procedure: ESOPHAGOGASTRODUODENOSCOPY (EGD);  Surgeon: Barrie FolkJohn C Hayes, MD;  Location: [facility] ENDOSCOPY;  Service: Endoscopy;  Laterality: N/A;  . TOTAL KNEE ARTHROPLASTY     B  . US ECHOCARDIOGRAPHY  February 2011   Normal EF; LAE and RVE    Current Medications: Outpatient Medications Prior to Visit  Medication Sig Dispense Refill  . acetaminophen (TYLENOL) 500 MG tablet Take 1,000 mg by mouth every 6 (six) hours as needed. For pain    . atorvastatin (LIPITOR) 40 MG tablet Take 40 mg by mouth daily.    . clopidogrel (PLAVIX) 75 MG tablet Take 1 tablet (75 mg total) by mouth daily. 90 tablet 1  . glimepiride (AMARYL) 1 MG tablet Take 1 mg by mouth daily.    . pantoprazole (PROTONIX) 40 MG tablet Take 40 mg by mouth 2 (two) times daily.    . potassium chloride SA (K-DUR,KLOR-CON) 20 MEQ tablet Take 2 tablets (40 mEq total) by mouth daily.  60 tablet 0  . tiotropium (SPIRIVA) 18 MCG inhalation capsule Place 18 mcg into inhaler and inhale daily.    . furosemide (LASIX) 40 MG tablet Take 1 tablet (40 mg total) by mouth every other day. 45 tablet 3  . metoprolol succinate (TOPROL-XL) 25 MG 24 hr tablet Take 1.5 tablets (37.5 mg total) by mouth daily. 30 tablet 3   No facility-administered medications prior to visit.      Allergies:   Aspirin and Trazodone and nefazodone   Social History   Social History  . Marital status: Married    Spouse name: [name]  . Number of children: 4  . Years of education: 12   Occupational History  . Retired    Social History Main Topics  . Smoking  status: Former Smoker    Quit date: 07/29/1981  . Smokeless tobacco: Never Used  . Alcohol use No  . Drug use: No  . Sexual activity: Yes   Other Topics Concern  . None   Social History Narrative   Lives with wife and daughter   Caffeine use: decaf coffee- 2-3 per day     Family History:  The patient's family history includes ALS in his brother; Cancer in his brother, mother, and sister; Hypertension in his mother; Pneumonia in his father.     ROS:   Please see the history of present illness.    ROS All other systems reviewed and are negative.   PHYSICAL EXAM:   VS:  BP 100/70   Pulse 64   Ht 6' (1.829 m)   Wt 221 lb (100.2 kg)   SpO2 96%   BMI 29.97 kg/m    GEN: Well nourished, well developed, in no acute distress, obese, chronically ill appearing.  HEENT: normal  Neck: no JVD, carotid bruits, or masses Cardiac: irreg irreg; no murmurs, rubs, or gallops,no edema  Respiratory:  clear to auscultation bilaterally, normal work of breathing GI: soft, nontender, nondistended, + BS MS: no deformity or atrophy  Skin: warm and dry, no rash Neuro:  Alert and Oriented x 3, Strength and sensation are intact Psych: euthymic mood, full affect  Wt Readings from Last 3 Encounters:  04/16/16 221 lb (100.2 kg)  03/29/16 224 lb 12.8 oz (102 kg)  03/15/16 230 lb 12.8 oz (104.7 kg)      Studies/Labs Reviewed:   EKG:  EKG is NOT ordered today.    Recent Labs: 03/11/2016: ALT 16; B Natriuretic Peptide 591.6; TSH 3.338 03/25/2016: BUN 48; Creat 1.52; Hemoglobin 12.8; Platelets 286; Potassium 4.5; Sodium 142   Lipid Panel    Component Value Date/Time   CHOL 154 06/06/2011 0852   TRIG 115.0 06/06/2011 0852   HDL 48.90 06/06/2011 0852   CHOLHDL 3 06/06/2011 0852   VLDL 23.0 06/06/2011 0852   LDLCALC 82 06/06/2011 0852    Additional studies/ records that were reviewed today include:  2D Echo: 03/12/2016 Study Conclusions - Left ventricle: The cavity size was normal. There was  mild focal basal hypertrophy of the septum. Systolic function was moderately to severely reduced. The estimated ejection fraction was in the range of 30% to 35%. Diffuse hypokinesis. Features are consistent with a pseudonormal left ventricular filling pattern, with concomitant abnormal relaxation and increased filling pressure (grade 2 diastolic dysfunction). - Ventricular septum: The contour showed diastolic flattening. - Aortic valve: Trileaflet; moderately thickened, mildly calcified leaflets. There was mild regurgitation. - Left atrium: The atrium was moderately dilated. - Right ventricle: The cavity size was mildly  dilated. Wall thickness was normal. Systolic function was mildly reduced. - Right atrium: The atrium was mildly dilated. - Pulmonary arteries: Systolic pressure was moderately increased. PA peak pressure: 60 mm Hg (S). - Pericardium, extracardiac: A trivial pericardial effusion was identified circumferential to the heart. Features were not consistent with tamponade physiology. Impressions: - Compared to the prior study, there has been no significant interval change.   ASSESSMENT & PLAN:   Chronic atrial fibrillation:CHADSVASc score at least 5. Not a candidate for anticoagulation per prior cardiology notes (elderly, GI bleeding history, frequent falls). Rate well controlled on Toprol XL 37.5mg . HR is in 60s today.  Chronic systolic heart failure:wel controlled on lasix 40mg  daily. Continue Toprol XL 50mg  daily (increase by SNF)   HTN: BP much better today. Continue Toprol XL 50 mg daily. If he becomes hypertensive would add back lisinopril as HR limits further in crease in BB.   Hx of TIA: continue plavix and statin.   AKI: creat went up to 1.5 I think due to dehydration. Will check BMET today  Medication Adjustments/Labs and Tests Ordered: Current medicines are reviewed at length with the patient today.  Concerns regarding medicines  are outlined above.  Medication changes, Labs and Tests ordered today are listed in the Patient Instructions below. Patient Instructions  Medication Instructions:  Your physician recommends that you continue on your current medications as directed. Please refer to the Current Medication list given to you today.   Labwork: TODAY;  BMET  Testing/Procedures: None ordered  Follow-Up: Your physician recommends that you schedule a follow-up appointment in: 3 MONTHS WITH DR. Anne Fu   Any Other Special Instructions Will Be Listed Below (If Applicable).   If you need a refill on your cardiac medications before your next appointment, please call your pharmacy.      Byrd Hesselbach, PA-C  04/16/2016 10:54 AM    [facility] [address] [place], [place], [place]  [address] Phone: [phone]; Fax: [phone]

## 2016-04-16 ENCOUNTER — Ambulatory Visit (INDEPENDENT_AMBULATORY_CARE_PROVIDER_SITE_OTHER): Payer: Medicare Other | Admitting: Physician Assistant

## 2016-04-16 ENCOUNTER — Encounter: Payer: Self-pay | Admitting: Physician Assistant

## 2016-04-16 VITALS — BP 100/70 | HR 64 | Ht 72.0 in | Wt 221.0 lb

## 2016-04-16 DIAGNOSIS — I482 Chronic atrial fibrillation, unspecified: Secondary | ICD-10-CM

## 2016-04-16 DIAGNOSIS — Z8673 Personal history of transient ischemic attack (TIA), and cerebral infarction without residual deficits: Secondary | ICD-10-CM

## 2016-04-16 DIAGNOSIS — I959 Hypotension, unspecified: Secondary | ICD-10-CM | POA: Diagnosis not present

## 2016-04-16 DIAGNOSIS — I5022 Chronic systolic (congestive) heart failure: Secondary | ICD-10-CM

## 2016-04-16 DIAGNOSIS — I10 Essential (primary) hypertension: Secondary | ICD-10-CM | POA: Diagnosis not present

## 2016-04-16 LAB — BASIC METABOLIC PANEL
BUN: 28 mg/dL — AB (ref 7–25)
CHLORIDE: 107 mmol/L (ref 98–110)
CO2: 26 mmol/L (ref 20–31)
CREATININE: 1.18 mg/dL — AB (ref 0.70–1.11)
Calcium: 8.3 mg/dL — ABNORMAL LOW (ref 8.6–10.3)
GLUCOSE: 149 mg/dL — AB (ref 65–99)
POTASSIUM: 4.6 mmol/L (ref 3.5–5.3)
Sodium: 142 mmol/L (ref 135–146)

## 2016-04-16 NOTE — Patient Instructions (Addendum)
Medication Instructions:  Your physician recommends that you continue on your current medications as directed. Please refer to the Current Medication list given to you today.   Labwork: TODAY:  BMET  Testing/Procedures: None ordered  Follow-Up: Your physician recommends that you schedule a follow-up appointment in: 3 MONTHS WITH DR. SKAINS   Any Other Special Instructions Will Be Listed Below (If Applicable).     If you need a refill on your cardiac medications before your next appointment, please call your pharmacy.   

## 2016-05-01 ENCOUNTER — Ambulatory Visit (INDEPENDENT_AMBULATORY_CARE_PROVIDER_SITE_OTHER): Payer: Medicare Other | Admitting: Neurology

## 2016-05-01 VITALS — BP 117/79 | HR 119 | Temp 97.0°F | Ht 72.0 in

## 2016-05-01 DIAGNOSIS — F0281 Dementia in other diseases classified elsewhere with behavioral disturbance: Secondary | ICD-10-CM

## 2016-05-01 DIAGNOSIS — G3183 Dementia with Lewy bodies: Secondary | ICD-10-CM | POA: Diagnosis not present

## 2016-05-01 MED ORDER — DONEPEZIL HCL 10 MG PO TABS: 10.0000 mg | ORAL_TABLET | Freq: Every day | ORAL | 12 refills | Status: DC

## 2016-05-01 NOTE — Progress Notes (Signed)
[facility]    Provider:  Dr Lucia GaskinsAhern Referring Provider: Catha GosselinLittle, Kevin, MD Primary Care Physician:  [name],[name], MD  CC:  Hallucinations  Interval history 05/01/2016:  He had a CHF exacerbation and was in the hospital for a week and then to a rehab facility. He could not walk out of the hospital. Improving with therapy. Wife lives with husband and their daughter. Daughter here today. Discussed dementia and management of dementia. Discussed aricept, will start today, discussed side effects.  HPI:  Omar Chan is a 82 y.o. male here as a referral from Dr. Clarene DukeLittle for hallucinations. Past medical history of recently discover B12 deficiency and started getting B12 shots, hypertension, diabetes, high cholesterol, atrial fibrillation, diabetes, COPD. Here with wife and daughter. Hallucinations started after Christmas after he started falling. Wife and daughter provide most information. He hit his head. Unclear, maybe had several falls beforehand. Yesterday he thought the cat was chasing mice in the house. He believes his hallucinations. He sees his dead nephew sometimes. He sees different people in the bedroom. He has som eimaginary friends who sleeps with him. He hears gospel music. Music stays on all the time. Usually happens when he wakes up he sleeps off an on all day long doesn't sleep well at night. He snores and talks in his sleep. He gets up lots of times at night. Snores loudly per daughter. He has sleep apnea, had a test years ago and doesn;t have a cpap. He has short-term memory problems, slowly progressive. More short term memory. He forgets appointments. No known family history of dementia. No significant alcohol use. He has a FHx of ALS. Sister has alzheimers. Wife and daughter agree there is definitely progressive memory changes for several years or more. He has weakness in his legs with falls. He has physical therapy.    Reviewed notes, labs and imaging from  outside physicians, which showed:  LDL 90, A1c 6.3, BUN 20, creatinine 0.89 obtained 02/07/2016, TSH 3.9  A. fib treated with amiodarone and Plavix, not taking Coumadin due to history of GI bleed. For several months patient is seen hallucinations where he sees to coming in the house and at nighttime is sometimes talking to people that he sees and sometimes he hears music. These hallucinations are not affecting his functioning.  He has a history of COPD and at one point was using Spiriva. History of extensive smoking history. Also with weakness in the legs and difficulty standing. Rare alcohol use. Former smoker. 3 pack per day history.  CT of the head 2015, personally reviewed and agree with findings: Bony calvarium appears intact. Mild diffuse cortical atrophy is noted. Mild chronic ischemic white matter disease is noted. No mass effect or midline shift is noted. Ventricular size is within normal limits. There is no evidence of mass lesion, hemorrhage or acute infarction.  IMPRESSION: Mild diffuse cortical atrophy. Mild chronic ischemic white matter disease. No acute intracranial abnormality seen.    Review of Systems: Patient complains of symptoms per HPI as well as the following symptoms: Easy bruising, fatigue, shortness of breath, increased thirst, swelling in legs, hearing loss, spinning sensation, diarrhea, memory loss, confusion, insomnia, snoring, too much sleep, decreased energy, disinterest in activities, hallucinations. Pertinent negatives per HPI. All others negative.   Social History   Social History  . Marital status: Married    Spouse name: Stark Kleinlsie  . Number of children: 4  . Years of education: 12   Occupational History  . Retired  Social History Main Topics  . Smoking status: Former Smoker    Quit date: 07/29/1981  . Smokeless tobacco: Never Used  . Alcohol use No  . Drug use: No  . Sexual activity: Yes   Other Topics Concern  . Not on file   Social  History Narrative   Lives with wife and daughter   Caffeine use: decaf coffee- 2-3 per day    Family History  Problem Relation Age of Onset  . Cancer Mother     deceased  . Hypertension Mother   . Pneumonia Father     deceased  . ALS Brother     deceased  . Cancer Brother     deceased/bone ca  . Cancer Sister     deceased  . Heart attack Neg Hx   . Stroke Neg Hx     Past Medical History:  Diagnosis Date  . Acute on chronic systolic CHF (congestive heart failure) (HCC)   . CHF (congestive heart failure) (HCC)   . Dementia   . Diabetes mellitus   . Exogenous obesity   . GERD (gastroesophageal reflux disease)   . Hiatal hernia   . Hyperlipidemia   . Hypertension   . Microcytic anemia   . Osteoarthritis   . Paroxysmal atrial fibrillation (HCC)    cardioversion 01/25/10    Past Surgical History:  Procedure Laterality Date  . CARDIOVASCULAR STRESS TEST  2007   No ischemia. EF 61%  . CARDIOVERSION  01/25/10  . CARDIOVERSION N/A 08/19/2013   Procedure: CARDIOVERSION;  Surgeon: Cassell Clement, MD;  Location: [facility] ENDOSCOPY;  Service: Cardiovascular;  Laterality: N/A;  . ESOPHAGOGASTRODUODENOSCOPY N/A 06/02/2014   Procedure: ESOPHAGOGASTRODUODENOSCOPY (EGD);  Surgeon: Barrie Folk, MD;  Location: [facility] ENDOSCOPY;  Service: Endoscopy;  Laterality: N/A;  . TOTAL KNEE ARTHROPLASTY     B  . US ECHOCARDIOGRAPHY  February 2011   Normal EF; LAE and RVE    Current Outpatient Prescriptions  Medication Sig Dispense Refill  . acetaminophen (TYLENOL) 500 MG tablet Take 1,000 mg by mouth every 6 (six) hours as needed. For pain    . atorvastatin (LIPITOR) 40 MG tablet Take 40 mg by mouth daily.    . clopidogrel (PLAVIX) 75 MG tablet Take 1 tablet (75 mg total) by mouth daily. 90 tablet 1  . furosemide (LASIX) 40 MG tablet Take 40 mg by mouth daily.    Marland Kitchen glimepiride (AMARYL) 1 MG tablet Take 1 mg by mouth daily.    . metoprolol succinate (TOPROL-XL) 50 MG 24 hr tablet Take 50 mg by  mouth. Take with or immediately following a meal.    . pantoprazole (PROTONIX) 40 MG tablet Take 40 mg by mouth 2 (two) times daily.    . potassium chloride SA (K-DUR,KLOR-CON) 20 MEQ tablet Take 2 tablets (40 mEq total) by mouth daily. 60 tablet 0  . tiotropium (SPIRIVA) 18 MCG inhalation capsule Place 18 mcg into inhaler and inhale daily.     No current facility-administered medications for this visit.     Allergies as of 05/01/2016 - Review Complete 05/01/2016  Allergen Reaction Noted  . Aspirin Hives 08/06/2010  . Trazodone and nefazodone Other (See Comments) 04/26/2015    Vitals: BP 117/79 (BP Location: Left Arm, Patient Position: Sitting, Cuff Size: Normal)   Pulse (!) 119   Temp 97 F (36.1 C) (Oral)   Ht 6' (1.829 m)   SpO2 92%  Last Weight:  Wt Readings from Last 1 Encounters:  04/16/16 221 lb (  100.2 kg)   Last Height:   Ht Readings from Last 1 Encounters:  05/01/16 6' (1.829 m)   MMSE - Mini Mental State Exam 02/27/2016  Orientation to time 4  Orientation to Place 4  Registration 3  Attention/ Calculation 5  Recall 2  Language- name 2 objects 2  Language- repeat 1  Language- follow 3 step command 3  Language- read & follow direction 1  Write a sentence 1  Copy design 0  Total score 26     The patient is oriented to person, place, and time;     recent and remote memory impaired ;     language fluent;     Impaired attention, concentration,     fund of knowledge impaired Cranial Nerves:    The pupils are equal, round, and reactive to light. Attempted fundoscopic exam coul dnot visualize due to small pupils. Visual fields are full to finger confrontation. Extraocular movements are intact. Trigeminal sensation is intact and the muscles of mastication are normal. The face is symmetric. The palate elevates in the midline. Hearing intact. Voice is normal. Shoulder shrug is normal. The tongue has normal motion without fasciculations.   Coordination:    No  dysmetria. Normal rapid alternating movements.   Gait: Cannot get up unassisted. Uses a cane. Antalgic, slightly stooped, not shuffling but small steps, difficulty with balance, wide based.   Motor Observation:    No asymmetry, no atrophy, and no involuntary movements noted. Tone:    Increased muscle tone, slight cogwheeling  Posture:    Posture is stooped    Strength: Mild triceps and hip flexion weakness. Otherwise strength is V/V in the upper and lower limbs.      Sensation: intact to LT     Reflex Exam:  DTR's: Brisk uppers, hypo patellars, absent AJs.    Toes:    The toes are downgoing bilaterally.   Clonus:    Clonus is absent.      Assessment/Plan:  82 year old male with hallucinations, memory loss, gait abnormality. Past medical history of recently discover B12 deficiency and started getting B12 shots, hypertension, diabetes, high cholesterol, atrial fibrillation, diabetes, COPD. Likely dementia, possibly lewy-body dementia. Discussed with family and they suspected dementia as well.    No driving, he acknowledged and family understands Start Aricept F/u 6 months Consider namenda at next appt and repeat MMSE   Naomie Dean, MD  [facility] [address] [place], [place] [address]  Phone [phone] Fax [phone] Naomie Dean, MD  [facility] [address] [place], [place] [address]  Phone [phone] Fax [phone]  A total of 30 minutes was spent face-to-face with this patient. Over half this time was spent on counseling patient on the dementia diagnosis and different diagnostic and therapeutic options available.

## 2016-05-01 NOTE — Patient Instructions (Signed)
Remember to drink plenty of fluid, eat healthy meals and do not skip any meals. Try to eat protein with a every meal and eat a healthy snack such as fruit or nuts in between meals. Try to keep a regular sleep-wake schedule and try to exercise daily, particularly in the form of walking, 20-30 minutes a day, if you can.   As far as your medications are concerned, I would like to suggest: Aricept 1/2 pill and then in 2 weeks increase to a whole pill if no side effects   I would like to see you back in 6 months, sooner if we need to. Please call us with any interim questions, concerns, problems, updates or refill requests.    Our phone number is [phone]. We also have an after hours call service for urgent matters and there is a physician on-call for urgent questions. For any emergencies you know to call 911 or go to the nearest emergency room  Donepezil tablets What is this medicine? DONEPEZIL (doe NEP e zil) is used to treat mild to moderate dementia caused by Alzheimer's disease. This medicine may be used for other purposes; ask your health care provider or pharmacist if you have questions. What should I tell my health care provider before I take this medicine? They need to know if you have any of these conditions: -asthma or other lung disease -difficulty passing urine -head injury -heart disease -history of irregular heartbeat -liver disease -seizures (convulsions) -stomach or intestinal disease, ulcers or stomach bleeding -an unusual or allergic reaction to donepezil, other medicines, foods, dyes, or preservatives -pregnant or trying to get pregnant -breast-feeding How should I use this medicine? Take this medicine by mouth with a glass of water. Follow the directions on the prescription label. You may take this medicine with or without food. Take this medicine at regular intervals. This medicine is usually taken before bedtime. Do not take it more often than directed. Continue to  take your medicine even if you feel better. Do not stop taking except on your doctor's advice. If you are taking the 23 mg donepezil tablet, swallow it whole; do not cut, crush, or chew it. Talk to your pediatrician regarding the use of this medicine in children. Special care may be needed. Overdosage: If you think you have taken too much of this medicine contact a poison control center or emergency room at once. NOTE: This medicine is only for you. Do not share this medicine with others. What if I miss a dose? If you miss a dose, take it as soon as you can. If it is almost time for your next dose, take only that dose, do not take double or extra doses. What may interact with this medicine? Do not take this medicine with any of the following medications: -certain medicines for fungal infections like itraconazole, fluconazole, posaconazole, and voriconazole -cisapride -dextromethorphan; quinidine -dofetilide -dronedarone -pimozide -quinidine -thioridazine -ziprasidone This medicine may also interact with the following medications: -antihistamines for allergy, cough and cold -atropine -bethanechol -carbamazepine -certain medicines for bladder problems like oxybutynin, tolterodine -certain medicines for Parkinson's disease like benztropine, trihexyphenidyl -certain medicines for stomach problems like dicyclomine, hyoscyamine -certain medicines for travel sickness like scopolamine -dexamethasone -ipratropium -NSAIDs, medicines for pain and inflammation, like ibuprofen or naproxen -other medicines for Alzheimer's disease -other medicines that prolong the QT interval (cause an abnormal heart rhythm) -phenobarbital -phenytoin -rifampin, rifabutin or rifapentine This list may not describe all possible interactions. Give your health care provider a list of  all the medicines, herbs, non-prescription drugs, or dietary supplements you use. Also tell them if you smoke, drink alcohol, or use  illegal drugs. Some items may interact with your medicine. What should I watch for while using this medicine? Visit your doctor or health care professional for regular checks on your progress. Check with your doctor or health care professional if your symptoms do not get better or if they get worse. You may get drowsy or dizzy. Do not drive, use machinery, or do anything that needs mental alertness until you know how this drug affects you. What side effects may I notice from receiving this medicine? Side effects that you should report to your doctor or health care professional as soon as possible: -allergic reactions like skin rash, itching or hives, swelling of the face, lips, or tongue -changes in vision -feeling faint or lightheaded, falls -problems with balance -redness, blistering, peeling or loosening of the skin, including inside the mouth -slow heartbeat, or palpitations -stomach pain -unusual bleeding or bruising, red or purple spots on the skin -vomiting -weight loss Side effects that usually do not require medical attention (report to your doctor or health care professional if they continue or are bothersome): -diarrhea, especially when starting treatment -headache -indigestion or heartburn -loss of appetite -muscle cramps -nausea This list may not describe all possible side effects. Call your doctor for medical advice about side effects. You may report side effects to FDA at 1-800-FDA-1088. Where should I keep my medicine? Keep out of reach of children. Store at room temperature between 15 and 30 degrees C (59 and 86 degrees F). Throw away any unused medicine after the expiration date. NOTE: This sheet is a summary. It may not cover all possible information. If you have questions about this medicine, talk to your doctor, pharmacist, or health care provider.    2016, Elsevier/Gold Standard. (2014-02-24 07:51:52)

## 2016-05-27 ENCOUNTER — Telehealth: Payer: Self-pay | Admitting: Neurology

## 2016-05-27 MED ORDER — DONEPEZIL HCL 10 MG PO TABS: 10.0000 mg | ORAL_TABLET | Freq: Every day | ORAL | 3 refills | Status: AC

## 2016-05-27 NOTE — Telephone Encounter (Signed)
Patient's wife is calling to get a new Rx for donepezil (ARICEPT) 10 MG tablet called to [facility] for the patient.

## 2016-05-27 NOTE — Addendum Note (Signed)
Addended by: Hillis RangeKING, Benjamin Merrihew L on: 05/27/2016 04:15 PM   Modules accepted: Orders

## 2016-05-27 NOTE — Telephone Encounter (Signed)
Sent rx aricept to [facility] electronically as requested.

## 2016-05-31 ENCOUNTER — Telehealth: Payer: Self-pay | Admitting: Cardiology

## 2016-05-31 NOTE — Telephone Encounter (Signed)
Follow Up:    Olegario MessierKathy from Dr Wadie Lessenowan's office wants to know if you received surgical clearance on 05-24-16?Marland Kitchen.She needs this back asap please.

## 2016-05-31 NOTE — Telephone Encounter (Signed)
Aware form was completed and taken to MR to be faxed.

## 2016-06-06 NOTE — Telephone Encounter (Signed)
Spouse called to check status of donepezil refill, states they still haven't received this prescription. Spouse advised, Rx was sent electronically by nurse to [facility] on 10/30 4:14 EDT. Spouse will follow up with [facility].

## 2016-06-26 ENCOUNTER — Other Ambulatory Visit: Payer: Self-pay | Admitting: Orthopedic Surgery

## 2016-07-01 ENCOUNTER — Encounter (HOSPITAL_COMMUNITY): Payer: Self-pay | Admitting: Emergency Medicine

## 2016-07-01 ENCOUNTER — Ambulatory Visit (HOSPITAL_COMMUNITY)
Admission: RE | Admit: 2016-07-01 | Discharge: 2016-07-01 | Disposition: A | Discharge status: Home / Self Care | Payer: Medicare Other | Source: Ambulatory Visit | Attending: Orthopedic Surgery | Admitting: Orthopedic Surgery

## 2016-07-01 ENCOUNTER — Encounter (HOSPITAL_COMMUNITY)
Admission: RE | Admit: 2016-07-01 | Discharge: 2016-07-01 | Disposition: A | Discharge status: Home / Self Care | Payer: Medicare Other | Source: Ambulatory Visit | Attending: Orthopedic Surgery | Admitting: Orthopedic Surgery

## 2016-07-01 ENCOUNTER — Encounter (HOSPITAL_COMMUNITY): Payer: Self-pay

## 2016-07-01 DIAGNOSIS — Z01818 Encounter for other preprocedural examination: Secondary | ICD-10-CM | POA: Diagnosis present

## 2016-07-01 DIAGNOSIS — R0989 Other specified symptoms and signs involving the circulatory and respiratory systems: Secondary | ICD-10-CM | POA: Diagnosis not present

## 2016-07-01 DIAGNOSIS — Z01810 Encounter for preprocedural cardiovascular examination: Secondary | ICD-10-CM | POA: Insufficient documentation

## 2016-07-01 DIAGNOSIS — I517 Cardiomegaly: Secondary | ICD-10-CM | POA: Diagnosis not present

## 2016-07-01 DIAGNOSIS — Z01812 Encounter for preprocedural laboratory examination: Secondary | ICD-10-CM | POA: Diagnosis not present

## 2016-07-01 DIAGNOSIS — R936 Abnormal findings on diagnostic imaging of limbs: Secondary | ICD-10-CM | POA: Insufficient documentation

## 2016-07-01 HISTORY — DX: Pneumonia, unspecified organism: J18.9

## 2016-07-01 HISTORY — DX: Other complications of anesthesia, initial encounter: T88.59XA

## 2016-07-01 HISTORY — DX: Unspecified convulsions: R56.9

## 2016-07-01 HISTORY — DX: Adverse effect of unspecified anesthetic, initial encounter: T41.45XA

## 2016-07-01 HISTORY — DX: Chronic obstructive pulmonary disease, unspecified: J44.9

## 2016-07-01 LAB — CBC WITH DIFFERENTIAL/PLATELET
BASOS PCT: 0 %
Basophils Absolute: 0 10*3/uL (ref 0.0–0.1)
EOS ABS: 0.1 10*3/uL (ref 0.0–0.7)
EOS PCT: 2 %
HCT: 40.2 % (ref 39.0–52.0)
HEMOGLOBIN: 12.2 g/dL — AB (ref 13.0–17.0)
LYMPHS ABS: 1.1 10*3/uL (ref 0.7–4.0)
Lymphocytes Relative: 18 %
MCH: 29.4 pg (ref 26.0–34.0)
MCHC: 30.3 g/dL (ref 30.0–36.0)
MCV: 96.9 fL (ref 78.0–100.0)
MONOS PCT: 8 %
Monocytes Absolute: 0.5 10*3/uL (ref 0.1–1.0)
NEUTROS PCT: 72 %
Neutro Abs: 4.5 10*3/uL (ref 1.7–7.7)
PLATELETS: 210 10*3/uL (ref 150–400)
RBC: 4.15 MIL/uL — ABNORMAL LOW (ref 4.22–5.81)
RDW: 15.2 % (ref 11.5–15.5)
WBC: 6.2 10*3/uL (ref 4.0–10.5)

## 2016-07-01 LAB — BASIC METABOLIC PANEL
Anion gap: 9 (ref 5–15)
BUN: 23 mg/dL — AB (ref 6–20)
CALCIUM: 8.7 mg/dL — AB (ref 8.9–10.3)
CHLORIDE: 111 mmol/L (ref 101–111)
CO2: 23 mmol/L (ref 22–32)
CREATININE: 1.16 mg/dL (ref 0.61–1.24)
GFR calc non Af Amer: 54 mL/min — ABNORMAL LOW (ref >60)
Glucose, Bld: 155 mg/dL — ABNORMAL HIGH (ref 65–99)
Potassium: 4.2 mmol/L (ref 3.5–5.1)
SODIUM: 143 mmol/L (ref 135–145)

## 2016-07-01 LAB — TYPE AND SCREEN
ABO/RH(D): O NEG
Antibody Screen: NEGATIVE

## 2016-07-01 LAB — GLUCOSE, CAPILLARY: GLUCOSE-CAPILLARY: 162 mg/dL — AB (ref 65–99)

## 2016-07-01 LAB — URINE MICROSCOPIC-ADD ON

## 2016-07-01 LAB — URINALYSIS, ROUTINE W REFLEX MICROSCOPIC
Glucose, UA: NEGATIVE mg/dL
Hgb urine dipstick: NEGATIVE
Ketones, ur: NEGATIVE mg/dL
Leukocytes, UA: NEGATIVE
Nitrite: NEGATIVE
Protein, ur: 100 mg/dL — AB
Specific Gravity, Urine: 1.026 (ref 1.005–1.030)
pH: 5.5 (ref 5.0–8.0)

## 2016-07-01 LAB — APTT: aPTT: 37 seconds — ABNORMAL HIGH (ref 24–36)

## 2016-07-01 LAB — PROTIME-INR
INR: 1.27
Prothrombin Time: 16 seconds — ABNORMAL HIGH (ref 11.4–15.2)

## 2016-07-01 LAB — SURGICAL PCR SCREEN
MRSA, PCR: NEGATIVE
STAPHYLOCOCCUS AUREUS: NEGATIVE

## 2016-07-01 NOTE — Pre-Procedure Instructions (Addendum)
Omar Chan  07/01/2016      [id number] [address] [place], [place] - [phone] AT [facility] [address] [place] [address] Phone: [phone] Fax: [phone]  [facility] [place] - [address] [place] [id number] Phone: [phone] Fax: [phone]    Your procedure is scheduled on 07/08/16  [facility].M.  Call this number if you have problems the morning of surgery:  [phone]   Remember:  Do not eat food or drink liquids after midnight.  Take these medicines the morning of surgery with A SIP OF WATER pantoprazole(protonix),spiriva  STOP all herbel meds, nsaids (aleve,naproxen,advil,ibuprofen) 7 days prior to surgery starting 06/01/16 today including aspirin,all vitamins  Stop plavix per dr 07/03/16  No glimpiride day of surgery    How to Manage Your Diabetes Before and After Surgery  Why is it important to control my blood sugar before and after surgery? . Improving blood sugar levels before and after surgery helps healing and can limit problems. . A way of improving blood sugar control is eating a healthy diet by: o  Eating less sugar and carbohydrates o  Increasing activity/exercise o  Talking with your doctor about reaching your blood sugar goals . High blood sugars (greater than 180 mg/dL) can raise your risk of infections and slow your recovery, so you will need to focus on controlling your diabetes during the weeks before surgery. . Make sure that the doctor who takes care of your diabetes knows about your planned surgery including the date and location.  How do I manage my blood sugar before surgery? . Check your blood sugar at least 4 times a day, starting 2 days before surgery, to make sure that the level is not too high or low. o Check your blood sugar the morning of your surgery when you wake up and every  2 hours until you get to the Short Stay unit. . If your blood sugar is less than 70 mg/dL, you will need to treat for low blood sugar: o Do not take insulin. o Treat a low blood sugar (less than 70 mg/dL) with  cup of clear juice (cranberry or apple), 4 glucose tablets, OR glucose gel. o Recheck blood sugar in 15 minutes after treatment (to make sure it is greater than 70 mg/dL). If your blood sugar is not greater than 70 mg/dL on recheck, call [phone] for further instructions. . Report your blood sugar to the short stay nurse when you get to Short Stay.  . If you are admitted to the hospital after surgery: o Your blood sugar will be checked by the staff and you will probably be given insulin after surgery (instead of oral diabetes medicines) to make sure you have good blood sugar levels. o The goal for blood sugar control after surgery is 80-180 mg/dL.  WHAT DO I DO ABOUT MY DIABETES MEDICATION?   Marland Kitchen. Do not take oral diabetes medicines (pills) the morning of surgery.(glimpiride)    Do not wear jewelry, make-up or nail polish.  Do not wear lotions, powders, or perfumes, or deoderant.  Do not shave 48 hours prior to surgery.  Men may shave face and neck.  Do not bring valuables to the hospital.  [facility] is not responsible for any belongings or valuables.  Contacts, dentures or bridgework may not be worn into surgery.  Leave your  suitcase in the car.  After surgery it may be brought to your room.  For patients admitted to the hospital, discharge time will be determined by your treatment team.  Patients discharged the day of surgery will not be allowed to drive home.   Special instructions:  * Special Instructions: [place] - Preparing for Surgery  Before surgery, you can play an important role.  Because skin is not sterile, your skin needs to be as free of germs as possible.  You can reduce the number of germs on you skin by washing with CHG (chlorahexidine gluconate) soap  before surgery.  CHG is an antiseptic cleaner which kills germs and bonds with the skin to continue killing germs even after washing.  Please DO NOT use if you have an allergy to CHG or antibacterial soaps.  If your skin becomes reddened/irritated stop using the CHG and inform your nurse when you arrive at Short Stay.  Do not shave (including legs and underarms) for at least 48 hours prior to the first CHG shower.  You may shave your face.  Please follow these instructions carefully:   1.  Shower with CHG Soap the night before surgery and the morning of Surgery.  2.  If you choose to wash your hair, wash your hair first as usual with your normal shampoo.  3.  After you shampoo, rinse your hair and body thoroughly to remove the Shampoo.  4.  Use CHG as you would any other liquid soap.  You can apply chg directly  to the skin and wash gently with scrungie or a clean washcloth.  5.  Apply the CHG Soap to your body ONLY FROM THE NECK DOWN.  Do not use on open wounds or open sores.  Avoid contact with your eyes ears, mouth and genitals (private parts).  Wash genitals (private parts)       with your normal soap.  6.  Wash thoroughly, paying special attention to the area where your surgery will be performed.  7.  Thoroughly rinse your body with warm water from the neck down.  8.  DO NOT shower/wash with your normal soap after using and rinsing off the CHG Soap.  9.  Pat yourself dry with a clean towel.            10.  Wear clean pajamas.            11.  Place clean sheets on your bed the night of your first shower and do not sleep with pets.  Day of Surgery  Do not apply any lotions/deodorants the morning of surgery.  Please wear clean clothes to the hospital/surgery center.  Please read over the  fact sheets that you were given.

## 2016-07-01 NOTE — Progress Notes (Deleted)
   07/01/16 1419  OBSTRUCTIVE SLEEP APNEA  Have you ever been diagnosed with sleep apnea through a sleep study? No  Do you snore loudly (loud enough to be heard through closed doors)?  1  Do you often feel tired, fatigued, or sleepy during the daytime (such as falling asleep during driving or talking to someone)? 0  Has anyone observed you stop breathing during your sleep? 0  Do you have, or are you being treated for high blood pressure? 0  BMI more than 35 kg/m2? 1  Age > 50 (1-yes) 1  Neck circumference greater than:Male 16 inches or larger, Male 17inches or larger? 1 (18.5)  Male Gender (Yes=1) 1  Obstructive Sleep Apnea Score 5  Score 5 or greater  Results sent to PCP

## 2016-07-02 LAB — HEMOGLOBIN A1C
Hgb A1c MFr Bld: 7.1 % — ABNORMAL HIGH (ref 4.8–5.6)
MEAN PLASMA GLUCOSE: 157 mg/dL

## 2016-07-02 NOTE — Progress Notes (Signed)
Anesthesia Chart Review:  Pt is an 82 year old male scheduled for L total knee revision on 07/08/2016 with Gean BirchwoodFrank Rowan, MD.   - PCP is Catha GosselinKevin Little, MD. - Cardiologist is Donato SchultzMark Skains, MD. Pt has appointment 12/8 for pre-op eval.    PMH includes:  PAF (not on anticoagulation due to falls and GI bleed), CHF, HTN, DM, hyperlipidemia, dementia, GERD. Former smoker. BMI 31.   BP (!) 130/96   Pulse (!) 119   Temp 36.3 C (Oral)   Resp 20   Ht 6' (1.829 m)   Wt 230 lb (104.3 kg)   SpO2 99%   BMI 31.19 kg/m    Anesthesia history: pt reports he "flatlined" 24 hours after surgery in 2005. Consult note in Epic dated 08/26/03 indicates pt developed SVT after TKA that was treated with adenosine.   Pt hospitalized 8/14-8/18/17 for acute respiratory failure with hypoxia, acute on chronic CHF, acute encephalopathy  Medications include: lipitor, plavix, aricept, lasix, glimepiride, protonix, potassium, spiriva. Pt to stop plavix 5 days before surgery. Case is posted for spinal anesthesia; I have notified Olegario MessierKathy in Dr. Wadie Lessenowan's office pt is ineligible for spinal.   Preoperative labs reviewed.  - HgbA1c 7.1, glucose 155 - PT 16.0, PTT 37. Will recheck DOS.   CXR 07/01/16:  1. Stable cardiomegaly with mild vascular congestion and trace right effusion. 2. Small ovoid and rounded lucencies of the left humeral shaft may be secondary to osteoporosis however lytic disease such as myeloma is not entirely excluded.  EKG 07/01/16: Atrial flutter, HR is 120 bpm. LAD. Septal infarct, age undetermined. Inferior infarct, age undetermined.   - I routed the EKG to Dr. Anne FuSkains for review as pt's rate of aflutter is uncontrolled.   Echo 03/12/16:  - Left ventricle: The cavity size was normal. There was mild focal basal hypertrophy of the septum. Systolic function was moderately to severely reduced. The estimated ejection fraction was in the range of 30% to 35%. Diffuse hypokinesis. Features are consistent with a  pseudonormal left ventricular filling pattern, with concomitant abnormal relaxation and increased filling pressure (grade 2 diastolic dysfunction). - Ventricular septum: The contour showed diastolic flattening. - Aortic valve: Trileaflet; moderately thickened, mildly calcified leaflets. There was mild regurgitation. - Left atrium: The atrium was moderately dilated. - Right ventricle: The cavity size was mildly dilated. Wall thickness was normal. Systolic function was mildly reduced. - Right atrium: The atrium was mildly dilated. - Pulmonary arteries: Systolic pressure was moderately increased. PA peak pressure: 60 mm Hg (S). - Pericardium, extracardiac: A trivial pericardial effusion was identified circumferential to the heart. Features were not consistent with tamponade physiology.  Cardiac event monitor 09/12/15:   Atrial fibrillation/flutter persistent  Occasional PVC's  No adverse pauses  Good rate control of AFIB/Flutter (56-91 BPM)  No cardiac rhythm explanation for syncope.  In reading [name] note, not on anticoagulation because of prior GI bleeding  Carotid duplex 03/15/15: 1-39% bilateral ICA stenosis.  Pt to see Dr. Anne FuSkains for office visit 07/28/2016. I will revisit chart after that appointment.   Rica Mastngela Dynastee Brummell, FNP-BC [facility] Phone: [phone] 07/02/2016 4:12 PM

## 2016-07-04 ENCOUNTER — Ambulatory Visit: Payer: Medicare Other | Admitting: Cardiology

## 2016-07-04 NOTE — H&P (Signed)
TOTAL KNEE REVISION ADMISSION H&P  Patient is being admitted for left revision total knee arthroplasty.  Subjective:  Chief Complaint:left knee pain.  HPI: Omar Chan, 82 y.o. male, has a history of pain and functional disability in the left knee(s) due to failed previous arthroplasty and patient has failed non-surgical conservative treatments for greater than 12 weeks to include NSAID's and/or analgesics, use of assistive devices and activity modification. The indications for the revision of the total knee arthroplasty are loosening of one or more components. Onset of symptoms was abrupt starting 1 years ago with rapidlly worsening course since that time.  Prior procedures on the left knee(s) include arthroplasty.  Patient currently rates pain in the left knee(s) at 10 out of 10 with activity. There is night pain, worsening of pain with activity and weight bearing, pain that interferes with activities of daily living and pain with passive range of motion.  Patient has evidence of prosthetic loosening by imaging studies. This condition presents safety issues increasing the risk of falls.   There is no current active infection.  Patient Active Problem List   Diagnosis Date Noted  . Acute respiratory failure with hypoxia (HCC) 03/11/2016  . Acute encephalopathy 03/11/2016  . Fall 03/11/2016  . GERD (gastroesophageal reflux disease)   . Acute on chronic systolic CHF (congestive heart failure) (HCC)   . Dementia 02/28/2016  . Bilateral carotid bruits 03/08/2015  . Anemia 05/25/2014  . Dyslipidemia 10/08/2013  . Bradycardia 09/12/2013  . Chronic atrial fibrillation (HCC)   . Diabetes mellitus with complication (HCC)   . Exogenous obesity   . Hypertension    Past Medical History:  Diagnosis Date  . Acute on chronic systolic CHF (congestive heart failure) (HCC)   . CHF (congestive heart failure) (HCC)   . Complication of anesthesia    last 2 surgeries "flatlined" in room 24 hrs after  anesthesia.-2005 and ?  Marland Kitchen. COPD (chronic obstructive pulmonary disease) (HCC)    ?  Marland Kitchen. Dementia   . Diabetes mellitus   . Exogenous obesity   . GERD (gastroesophageal reflux disease)   . Hiatal hernia   . Hyperlipidemia   . Hypertension   . Microcytic anemia   . Osteoarthritis   . Paroxysmal atrial fibrillation (HCC)    cardioversion 01/25/10  . Pneumonia    hx  . Seizures (HCC)    once yrs ago >20 yrs ? no med given    Past Surgical History:  Procedure Laterality Date  . CARDIOVASCULAR STRESS TEST  2007   No ischemia. EF 61%  . CARDIOVERSION  01/25/10  . CARDIOVERSION N/A 08/19/2013   Procedure: CARDIOVERSION;  Surgeon: Cassell Clementhomas Brackbill, MD;  [facility];  Service: Cardiovascular;  Laterality: N/A;  . ESOPHAGOGASTRODUODENOSCOPY N/A 06/02/2014   Procedure: ESOPHAGOGASTRODUODENOSCOPY (EGD);  Surgeon: Barrie FolkJohn C Hayes, MD;  [facility];  Service: Endoscopy;  Laterality: N/A;  . JOINT REPLACEMENT Right    x2 and then surgery to clea up  . TOTAL KNEE ARTHROPLASTY Left    B  . US ECHOCARDIOGRAPHY  February 2011   Normal EF; LAE and RVE    No prescriptions prior to admission.   Allergies  Allergen Reactions  . Aspirin Hives  . Trazodone And Nefazodone Other (See Comments)    "hyperactivity"    Social History  Substance Use Topics  . Smoking status: Former Smoker    Packs/day: 2.00    Quit date: 07/29/1981  . Smokeless tobacco: Never Used  . Alcohol use No  Family History  Problem Relation Age of Onset  . Cancer Mother     deceased  . Hypertension Mother   . Pneumonia Father     deceased  . ALS Brother     deceased  . Cancer Brother     deceased/bone ca  . Cancer Sister     deceased  . Heart attack Neg Hx   . Stroke Neg Hx       Review of Systems  Constitutional: Positive for malaise/fatigue.  HENT: Positive for hearing loss.   Eyes: Negative.   Respiratory: Positive for shortness of breath.   Cardiovascular: Positive for leg swelling.        Htn, irregular heart beat  Gastrointestinal: Negative.   Genitourinary: Positive for frequency.  Musculoskeletal: Positive for joint pain.  Skin: Negative.   Neurological: Positive for focal weakness.  Endo/Heme/Allergies: Bruises/bleeds easily.       Blood sugar problem  Psychiatric/Behavioral: The patient has insomnia.      Objective:  Physical Exam  Constitutional: He is oriented to person, place, and time. He appears well-developed and well-nourished.  HENT:  Head: Normocephalic and atraumatic.  Eyes: Pupils are equal, round, and reactive to light.  Neck: Normal range of motion. Neck supple.  Cardiovascular: Intact distal pulses.   Respiratory: Effort normal.  Musculoskeletal: He exhibits tenderness.  The left knee has 1+ swelling tender along the medial joint line.  Obvious varus deformity.  Range of motion the office today is from 20-95 varus stress and enhances his pain.  Valgus stress diminish as his pain.  I cannot palpate any obvious effusion.  His BMI is 30.4.  Neurological: He is alert and oriented to person, place, and time.  Skin: Skin is warm and dry.  Psychiatric: He has a normal mood and affect. His behavior is normal. Judgment and thought content normal.    Vital signs in last 24 hours:    Labs:  Estimated body mass index is 31.19 kg/m as calculated from the following:   Height as of 07/01/16: 6' (1.829 m).   Weight as of 07/01/16: 104.3 kg (230 lb).  Imaging Review Plain radiographs demonstrate gross loosening of the tibial component and callus was seen medial and posterior to the implant which had settled into fairly significant varus deformity.    Assessment/Plan:  End stage arthritis, left knee(s) with failed previous arthroplasty.   The patient history, physical examination, clinical judgment of the provider and imaging studies are consistent with end stage degenerative joint disease of the left knee(s), previous total knee arthroplasty. Revision  total knee arthroplasty is deemed medically necessary. The treatment options including medical management, injection therapy, arthroscopy and revision arthroplasty were discussed at length. The risks and benefits of revision total knee arthroplasty were presented and reviewed. The risks due to aseptic loosening, infection, stiffness, patella tracking problems, thromboembolic complications and other imponderables were discussed. The patient acknowledged the explanation, agreed to proceed with the plan and consent was signed. Patient is being admitted for inpatient treatment for surgery, pain control, PT, OT, prophylactic antibiotics, VTE prophylaxis, progressive ambulation and ADL's and discharge planning.The patient is planning to be discharged to skilled nursing facility

## 2016-07-05 ENCOUNTER — Ambulatory Visit (HOSPITAL_COMMUNITY): Payer: Medicare Other | Admitting: Cardiology

## 2016-07-05 ENCOUNTER — Inpatient Hospital Stay (HOSPITAL_COMMUNITY)
Admission: AD | Admit: 2016-07-05 | Discharge: 2016-07-29 | DRG: 291 | Disposition: E | Payer: Medicare Other | Source: Ambulatory Visit | Attending: Cardiology | Admitting: Cardiology

## 2016-07-05 ENCOUNTER — Encounter (HOSPITAL_COMMUNITY): Payer: Self-pay | Admitting: *Deleted

## 2016-07-05 ENCOUNTER — Encounter: Payer: Self-pay | Admitting: Cardiology

## 2016-07-05 ENCOUNTER — Telehealth: Payer: Self-pay | Admitting: Cardiology

## 2016-07-05 VITALS — BP 128/70 | HR 120 | Ht 72.0 in | Wt 244.0 lb

## 2016-07-05 DIAGNOSIS — I2729 Other secondary pulmonary hypertension: Secondary | ICD-10-CM | POA: Diagnosis present

## 2016-07-05 DIAGNOSIS — K219 Gastro-esophageal reflux disease without esophagitis: Secondary | ICD-10-CM | POA: Diagnosis present

## 2016-07-05 DIAGNOSIS — I484 Atypical atrial flutter: Secondary | ICD-10-CM | POA: Diagnosis present

## 2016-07-05 DIAGNOSIS — Z87891 Personal history of nicotine dependence: Secondary | ICD-10-CM | POA: Diagnosis not present

## 2016-07-05 DIAGNOSIS — E663 Overweight: Secondary | ICD-10-CM | POA: Diagnosis present

## 2016-07-05 DIAGNOSIS — N183 Chronic kidney disease, stage 3 (moderate): Secondary | ICD-10-CM | POA: Diagnosis present

## 2016-07-05 DIAGNOSIS — Z7984 Long term (current) use of oral hypoglycemic drugs: Secondary | ICD-10-CM

## 2016-07-05 DIAGNOSIS — I130 Hypertensive heart and chronic kidney disease with heart failure and stage 1 through stage 4 chronic kidney disease, or unspecified chronic kidney disease: Secondary | ICD-10-CM | POA: Diagnosis present

## 2016-07-05 DIAGNOSIS — Z6832 Body mass index (BMI) 32.0-32.9, adult: Secondary | ICD-10-CM | POA: Diagnosis not present

## 2016-07-05 DIAGNOSIS — I5021 Acute systolic (congestive) heart failure: Secondary | ICD-10-CM | POA: Diagnosis not present

## 2016-07-05 DIAGNOSIS — I482 Chronic atrial fibrillation: Secondary | ICD-10-CM | POA: Diagnosis present

## 2016-07-05 DIAGNOSIS — M25569 Pain in unspecified knee: Secondary | ICD-10-CM | POA: Diagnosis present

## 2016-07-05 DIAGNOSIS — R296 Repeated falls: Secondary | ICD-10-CM | POA: Diagnosis present

## 2016-07-05 DIAGNOSIS — E1122 Type 2 diabetes mellitus with diabetic chronic kidney disease: Secondary | ICD-10-CM | POA: Diagnosis present

## 2016-07-05 DIAGNOSIS — I420 Dilated cardiomyopathy: Secondary | ICD-10-CM | POA: Diagnosis present

## 2016-07-05 DIAGNOSIS — Z7902 Long term (current) use of antithrombotics/antiplatelets: Secondary | ICD-10-CM | POA: Diagnosis not present

## 2016-07-05 DIAGNOSIS — I5023 Acute on chronic systolic (congestive) heart failure: Secondary | ICD-10-CM | POA: Diagnosis present

## 2016-07-05 DIAGNOSIS — Z886 Allergy status to analgesic agent status: Secondary | ICD-10-CM | POA: Diagnosis not present

## 2016-07-05 DIAGNOSIS — I272 Pulmonary hypertension, unspecified: Secondary | ICD-10-CM | POA: Diagnosis present

## 2016-07-05 DIAGNOSIS — R001 Bradycardia, unspecified: Secondary | ICD-10-CM | POA: Diagnosis not present

## 2016-07-05 DIAGNOSIS — I481 Persistent atrial fibrillation: Secondary | ICD-10-CM | POA: Diagnosis not present

## 2016-07-05 DIAGNOSIS — F05 Delirium due to known physiological condition: Secondary | ICD-10-CM | POA: Diagnosis not present

## 2016-07-05 DIAGNOSIS — Z9181 History of falling: Secondary | ICD-10-CM

## 2016-07-05 DIAGNOSIS — Z888 Allergy status to other drugs, medicaments and biological substances status: Secondary | ICD-10-CM

## 2016-07-05 DIAGNOSIS — Z8249 Family history of ischemic heart disease and other diseases of the circulatory system: Secondary | ICD-10-CM | POA: Diagnosis not present

## 2016-07-05 DIAGNOSIS — I4821 Permanent atrial fibrillation: Secondary | ICD-10-CM

## 2016-07-05 DIAGNOSIS — N179 Acute kidney failure, unspecified: Secondary | ICD-10-CM | POA: Diagnosis not present

## 2016-07-05 DIAGNOSIS — I509 Heart failure, unspecified: Secondary | ICD-10-CM | POA: Diagnosis present

## 2016-07-05 DIAGNOSIS — Z8673 Personal history of transient ischemic attack (TIA), and cerebral infarction without residual deficits: Secondary | ICD-10-CM | POA: Diagnosis not present

## 2016-07-05 DIAGNOSIS — E785 Hyperlipidemia, unspecified: Secondary | ICD-10-CM | POA: Diagnosis present

## 2016-07-05 DIAGNOSIS — Z66 Do not resuscitate: Secondary | ICD-10-CM | POA: Diagnosis not present

## 2016-07-05 DIAGNOSIS — R000 Tachycardia, unspecified: Secondary | ICD-10-CM | POA: Diagnosis present

## 2016-07-05 DIAGNOSIS — J449 Chronic obstructive pulmonary disease, unspecified: Secondary | ICD-10-CM | POA: Diagnosis present

## 2016-07-05 DIAGNOSIS — F0390 Unspecified dementia without behavioral disturbance: Secondary | ICD-10-CM | POA: Diagnosis present

## 2016-07-05 DIAGNOSIS — R0681 Apnea, not elsewhere classified: Secondary | ICD-10-CM | POA: Diagnosis not present

## 2016-07-05 LAB — TSH: TSH: 6.496 u[IU]/mL — ABNORMAL HIGH (ref 0.350–4.500)

## 2016-07-05 LAB — COMPREHENSIVE METABOLIC PANEL
ALK PHOS: 149 U/L — AB (ref 38–126)
ALT: 14 U/L — ABNORMAL LOW (ref 17–63)
ANION GAP: 10 (ref 5–15)
AST: 19 U/L (ref 15–41)
Albumin: 3.8 g/dL (ref 3.5–5.0)
BILIRUBIN TOTAL: 0.8 mg/dL (ref 0.3–1.2)
BUN: 25 mg/dL — ABNORMAL HIGH (ref 6–20)
CALCIUM: 9 mg/dL (ref 8.9–10.3)
CO2: 25 mmol/L (ref 22–32)
Chloride: 108 mmol/L (ref 101–111)
Creatinine, Ser: 1.28 mg/dL — ABNORMAL HIGH (ref 0.61–1.24)
GFR calc non Af Amer: 48 mL/min — ABNORMAL LOW (ref >60)
GFR, EST AFRICAN AMERICAN: 56 mL/min — AB (ref >60)
Glucose, Bld: 100 mg/dL — ABNORMAL HIGH (ref 65–99)
POTASSIUM: 4.5 mmol/L (ref 3.5–5.1)
SODIUM: 143 mmol/L (ref 135–145)
TOTAL PROTEIN: 6.8 g/dL (ref 6.5–8.1)

## 2016-07-05 LAB — CBC
HCT: 39.1 % (ref 39.0–52.0)
HEMOGLOBIN: 12.2 g/dL — AB (ref 13.0–17.0)
MCH: 29.8 pg (ref 26.0–34.0)
MCHC: 31.2 g/dL (ref 30.0–36.0)
MCV: 95.4 fL (ref 78.0–100.0)
Platelets: 225 10*3/uL (ref 150–400)
RBC: 4.1 MIL/uL — ABNORMAL LOW (ref 4.22–5.81)
RDW: 15.2 % (ref 11.5–15.5)
WBC: 6.9 10*3/uL (ref 4.0–10.5)

## 2016-07-05 LAB — GLUCOSE, CAPILLARY: Glucose-Capillary: 85 mg/dL (ref 65–99)

## 2016-07-05 LAB — BRAIN NATRIURETIC PEPTIDE: B NATRIURETIC PEPTIDE 5: 772.4 pg/mL — AB (ref 0.0–100.0)

## 2016-07-05 MED ORDER — TIOTROPIUM BROMIDE MONOHYDRATE 18 MCG IN CAPS
18.0000 ug | ORAL_CAPSULE | Freq: Every day | RESPIRATORY_TRACT | Status: DC
  Administered 2016-07-07 – 2016-07-08 (×2): 18 ug via RESPIRATORY_TRACT
  Filled 2016-07-05 (×2): qty 5

## 2016-07-05 MED ORDER — CEFAZOLIN SODIUM-DEXTROSE 2-4 GM/100ML-% IV SOLN: 2.0000 g | INTRAVENOUS | Status: DC

## 2016-07-05 MED ORDER — ONDANSETRON HCL 4 MG/2ML IJ SOLN
4.0000 mg | Freq: Four times a day (QID) | INTRAMUSCULAR | Status: DC | PRN
  Administered 2016-07-08: 4 mg via INTRAVENOUS
  Filled 2016-07-05: qty 2

## 2016-07-05 MED ORDER — PANTOPRAZOLE SODIUM 40 MG PO TBEC
40.0000 mg | DELAYED_RELEASE_TABLET | Freq: Two times a day (BID) | ORAL | Status: DC
  Administered 2016-07-05 – 2016-07-08 (×7): 40 mg via ORAL
  Filled 2016-07-05 (×7): qty 1

## 2016-07-05 MED ORDER — ACETAMINOPHEN 500 MG PO TABS: 1000.0000 mg | ORAL_TABLET | Freq: Four times a day (QID) | ORAL | Status: DC | PRN

## 2016-07-05 MED ORDER — GLIMEPIRIDE 1 MG PO TABS
1.0000 mg | ORAL_TABLET | Freq: Every day | ORAL | Status: DC
  Administered 2016-07-06 – 2016-07-07 (×2): 1 mg via ORAL
  Filled 2016-07-05 (×3): qty 1

## 2016-07-05 MED ORDER — GLIMEPIRIDE 1 MG PO TABS: 1.0000 mg | ORAL_TABLET | Freq: Every day | ORAL | Status: DC

## 2016-07-05 MED ORDER — SODIUM CHLORIDE 0.9 % IV SOLN: 250.0000 mL | INTRAVENOUS | Status: DC | PRN

## 2016-07-05 MED ORDER — DONEPEZIL HCL 10 MG PO TABS
10.0000 mg | ORAL_TABLET | Freq: Every day | ORAL | Status: DC
  Administered 2016-07-05 – 2016-07-08 (×4): 10 mg via ORAL
  Filled 2016-07-05 (×4): qty 1

## 2016-07-05 MED ORDER — FUROSEMIDE 10 MG/ML IJ SOLN
40.0000 mg | Freq: Three times a day (TID) | INTRAMUSCULAR | Status: DC
  Administered 2016-07-05 – 2016-07-06 (×2): 40 mg via INTRAVENOUS
  Filled 2016-07-05 (×2): qty 4

## 2016-07-05 MED ORDER — CHLORHEXIDINE GLUCONATE 4 % EX LIQD: 60.0000 mL | Freq: Once | CUTANEOUS | Status: DC

## 2016-07-05 MED ORDER — ATORVASTATIN CALCIUM 40 MG PO TABS
40.0000 mg | ORAL_TABLET | Freq: Every day | ORAL | Status: DC
  Administered 2016-07-06 – 2016-07-08 (×3): 40 mg via ORAL
  Filled 2016-07-05 (×3): qty 1

## 2016-07-05 MED ORDER — POTASSIUM CHLORIDE CRYS ER 20 MEQ PO TBCR
20.0000 meq | EXTENDED_RELEASE_TABLET | Freq: Two times a day (BID) | ORAL | Status: DC
  Administered 2016-07-05 – 2016-07-08 (×7): 20 meq via ORAL
  Filled 2016-07-05 (×7): qty 1

## 2016-07-05 MED ORDER — DEXTROSE-NACL 5-0.45 % IV SOLN: INTRAVENOUS | Status: DC

## 2016-07-05 MED ORDER — SODIUM CHLORIDE 0.9% FLUSH: 3.0000 mL | INTRAVENOUS | Status: DC | PRN

## 2016-07-05 MED ORDER — SODIUM CHLORIDE 0.9% FLUSH
3.0000 mL | Freq: Two times a day (BID) | INTRAVENOUS | Status: DC
  Administered 2016-07-05 – 2016-07-08 (×6): 3 mL via INTRAVENOUS

## 2016-07-05 MED ORDER — METOPROLOL SUCCINATE ER 25 MG PO TB24
25.0000 mg | ORAL_TABLET | Freq: Every day | ORAL | Status: DC
  Administered 2016-07-05 – 2016-07-06 (×2): 25 mg via ORAL
  Filled 2016-07-05 (×2): qty 1

## 2016-07-05 MED ORDER — HEPARIN SODIUM (PORCINE) 5000 UNIT/ML IJ SOLN
5000.0000 [IU] | Freq: Three times a day (TID) | INTRAMUSCULAR | Status: DC
  Administered 2016-07-05 – 2016-07-08 (×10): 5000 [IU] via SUBCUTANEOUS
  Filled 2016-07-05 (×10): qty 1

## 2016-07-05 MED ORDER — ACETAMINOPHEN 325 MG PO TABS
650.0000 mg | ORAL_TABLET | ORAL | Status: DC | PRN
  Administered 2016-07-06 – 2016-07-08 (×6): 650 mg via ORAL
  Filled 2016-07-05 (×6): qty 2

## 2016-07-05 NOTE — Progress Notes (Signed)
History and physical   Date:  2016-07-21   ID:  Omar Chan, DOB [birth year], MRN [id number]  PCP:  Mickie Hillier, MD  Cardiologist:   Donato Schultz, MD   Chief complaint: Shortness of breath  History of Present Illness:  Omar Chan is a 82 y.o. male former patient of Dr. Yevonne Chan who Is here today prior to potential knee operation with tachycardia, atrial fibrillation/flutter with rapid ventricular response, acute systolic heart failure, dyspnea.   He had a fall a few months ago that resulted in a stress fracture in his previously replaced left knee. This is been quite painful for him. He was post undergo surgery by Dr. Marya Chan of orthopedic surgery however during the preoperative evaluation was noted to be tachycardic. He comes to my office today for further evaluation. He is sitting in a wheelchair, visibly mildly increased work of breathing. His wife is currently with him as well as his daughter who has been helping to take care of him. He has been struggling at home.   He was in the hospital in August with rapid ventricular response atrial fibrillation. His blood pressure on subsequent clinic visit was 70 systolic, weak, stopped his spironolactone and lisinopril.  He previously has not been on anticoagulation secondary to falls and GI bleeding. Echocardiogram showed mild LVH with normal LV function. Mild pulmonary hypertension.   Past Medical History:  Diagnosis Date  . Acute on chronic systolic CHF (congestive heart failure) (HCC)   . CHF (congestive heart failure) (HCC)   . Complication of anesthesia    last 2 surgeries "flatlined" in room 24 hrs after anesthesia.-2005 and ?  Marland Kitchen COPD (chronic obstructive pulmonary disease) (HCC)    ?  Marland Kitchen Dementia   . Diabetes mellitus   . Exogenous obesity   . GERD (gastroesophageal reflux disease)   . Hiatal hernia   . Hyperlipidemia   . Hypertension   . Microcytic anemia   . Osteoarthritis   . Paroxysmal atrial  fibrillation (HCC)    cardioversion 01/25/10  . Pneumonia    hx  . Seizures (HCC)    once yrs ago >20 yrs ? no med given    Past Surgical History:  Procedure Laterality Date  . CARDIOVASCULAR STRESS TEST  2007   No ischemia. EF 61%  . CARDIOVERSION  01/25/10  . CARDIOVERSION N/A 08/19/2013   Procedure: CARDIOVERSION;  Surgeon: Omar Clement, MD;  Location: [facility] ENDOSCOPY;  Service: Cardiovascular;  Laterality: N/A;  . ESOPHAGOGASTRODUODENOSCOPY N/A 06/02/2014   Procedure: ESOPHAGOGASTRODUODENOSCOPY (EGD);  Surgeon: Omar Folk, MD;  Location[facility] ENDOSCOPY;  Service: Endoscopy;  Laterality: N/A;  . JOINT REPLACEMENT Right    x2 and then surgery to clea up  . TOTAL KNEE ARTHROPLASTY Left    B  . US ECHOCARDIOGRAPHY  February 2011   Normal EF; LAE and RVE    Current Medications: Outpatient Medications Prior to Visit  Medication Sig Dispense Refill  . acetaminophen (TYLENOL) 500 MG tablet Take 1,000 mg by mouth every 6 (six) hours as needed. For pain    . atorvastatin (LIPITOR) 40 MG tablet Take 40 mg by mouth daily.    . carboxymethylcellulose (REFRESH TEARS) 0.5 % SOLN Place 1 drop into both eyes 3 (three) times daily as needed (for dry eyes).    Marland Kitchen donepezil (ARICEPT) 10 MG tablet Take 1 tablet (10 mg total) by mouth at bedtime. 90 tablet 3  . furosemide (LASIX) 40 MG tablet Take 60 mg by mouth  daily.     . glimepiride (AMARYL) 1 MG tablet Take 1 mg by mouth daily.    . pantoprazole (PROTONIX) 40 MG tablet Take 40 mg by mouth 2 (two) times daily.    Marland Kitchen. tiotropium (SPIRIVA) 18 MCG inhalation capsule Place 18 mcg into inhaler and inhale daily.    . potassium chloride SA (K-DUR,KLOR-CON) 20 MEQ tablet Take 2 tablets (40 mEq total) by mouth daily. (Patient taking differently: Take 20 mEq by mouth daily. ) 60 tablet 0  . clopidogrel (PLAVIX) 75 MG tablet Take 1 tablet (75 mg total) by mouth daily. 90 tablet 1   No facility-administered medications prior to visit.      Allergies:    Aspirin and Trazodone and nefazodone   Social History   Social History  . Marital status: Married    Spouse name: Omar Chan  . Number of children: 4  . Years of education: 12   Occupational History  . Retired    Social History Main Topics  . Smoking status: Former Smoker    Packs/day: 2.00    Quit date: 07/29/1981  . Smokeless tobacco: Never Used  . Alcohol use No  . Drug use: No  . Sexual activity: Yes   Other Topics Concern  . None   Social History Narrative   Lives with wife and daughter   Caffeine use: decaf coffee- 2-3 per day     Family History:  The patient's family history includes ALS in his brother; Cancer in his brother, mother, and sister; Hypertension in his mother; Pneumonia in his father.   ROS:   Please see the history of present illness.   Frequent falls, shortness of breath, knee pain, denies syncope, denies fevers, denies bleeding. ROS All other systems reviewed and are negative.   PHYSICAL EXAM:   VS:  BP 128/70   Pulse (!) 120   Ht 6' (1.829 m)   Wt 244 lb (110.7 kg)   BMI 33.09 kg/m   pulse was approximately 120 not 52. GEN: Well nourished, well developed, in no acute distress  HEENT: normal  Neck: no JVD, carotid bruits, or masses Cardiac: Tachycardic, fairly regular, no murmurs, rubs, or gallops, 2+ bilateral lower extremity edema  Respiratory:  Mildly increased work of breathing, mild crackles heard bilateral bases GI: soft, nontender, nondistended, + BS, overweight MS: no deformity or atrophy  Skin: warm and dry, no rash Neuro:  Alert and Oriented x 3, Strength and sensation are intact Psych: euthymic mood, full affect  Wt Readings from Last 3 Encounters:  09-11-15 244 lb (110.7 kg)  07/01/16 230 lb (104.3 kg)  04/16/16 221 lb (100.2 kg)      Studies/Labs Reviewed:   EKG:  None today  Recent Labs: 03/11/2016: ALT 16; B Natriuretic Peptide 591.6; TSH 3.338 07/01/2016: BUN 23; Creatinine, Ser 1.16; Hemoglobin 12.2; Platelets 210;  Potassium 4.2; Sodium 143   Lipid Panel    Component Value Date/Time   CHOL 154 06/06/2011 0852   TRIG 115.0 06/06/2011 0852   HDL 48.90 06/06/2011 0852   CHOLHDL 3 06/06/2011 0852   VLDL 23.0 06/06/2011 0852   LDLCALC 82 06/06/2011 0852    Additional studies/ records that were reviewed today include:  Event monitor 09/12/15  Atrial fibrillation/flutter persistent  Occasional PVC's  No adverse pauses  Good rate control of AFIB/Flutter (56-91 BPM)  No cardiac rhythm explanation for syncope.  In reading [name] note, not on anticoagulation because of prior GI bleeding.  Donato SchultzSKAINS, MARK, MD  Echocardiogram 09/12/15: - Left ventricle: The cavity size was normal. Wall thickness was  increased in a pattern of mild LVH. Systolic function was  moderately reduced. The estimated ejection fraction was in the  range of 35% to 40%. Diffuse hypokinesis. - Aortic valve: Valve mobility was restricted. - Left atrium: The atrium was severely dilated. - Right atrium: The atrium was mildly dilated.  Impressions:  - Moderate global reduction in LV function; biatrial enlargement;  trace MR and TR. Compared to 08/04/13, LV function is worse.   ASSESSMENT:    1. Acute on chronic systolic CHF (congestive heart failure) (HCC)   2. Permanent atrial fibrillation (HCC)   3. Dilated cardiomyopathy (HCC)      PLAN:  In order of problems listed above:  Acute on chronic systolic heart failure  - Admitting him to hospital  - IV diuresis 40 mg IV every 8 hours  - Start metoprolol XL 25 mg to help with rate control which will help with heart failure as well.  - Watch for signs of bradycardia  - Needs to be improved prior to thinking about surgery.  - Previously lisinopril and spironolactone were discontinued because of marked hypotension.  Permanent atrial fibrillation  -He is not anticoagulation because of GI bleeding occult as well as frequent falls.  -Previously had had  bradycardia with beta blocker and calcium channel blocker.  - We will be careful with Toprol.  Questionable history of TIA  - On Plavix  Hyperlipidemia  - I will go ahead and discontinue his gemfibrozil drug regimen especially at his age. He has not had any episodes of pancreatitis. Dr. Clarene DukeLittle will be checking his physical exam blood work in August.  Deconditioning  - Significant issue for him. Knee pain.  Preoperative cardiac risk stratification  - He needs to improve from a heart failure standpoint and from a rate control standpoint prior to proceeding with revision of knee prosthesis. I will send this note to Dr. Wadie Lessenowan's team.   Medication Adjustments/Labs and Tests Ordered: Current medicines are reviewed at length with the patient today.  Concerns regarding medicines are outlined above.  Medication changes, Labs and Tests ordered today are listed in the Patient Instructions below. Patient Instructions  Medication Instructions:  Your physician recommends that you continue on your current medications as directed. Please refer to the Current Medication list given to you today.   Labwork: None ordered   Testing/Procedures: None ordered   Follow-Up: Your physician recommends that you schedule a follow-up appointment to be determined after discharge         Signed, Donato SchultzMark Skains, MD  07/16/2016 3:28 PM    [facility] [address], [place], [place]  [address] Phone: [phone]; Fax: [phone]

## 2016-07-05 NOTE — Telephone Encounter (Signed)
Will forward to Dr Anne FuSkains however the patient is being admitted today for CHF.

## 2016-07-05 NOTE — Telephone Encounter (Signed)
Agree with above. Admitted to the hospital for heart failure, and rate control of atrial fibrillation. Unfortunately, his knee surgery will need to be postponed.  Omar SchultzMark Xiong Haidar, MD

## 2016-07-05 NOTE — Telephone Encounter (Signed)
New Message  Nurse called and voiced to make sure MD-Skains checks his staff messages due to him not replied back but it's in regards to pts surgery so MD-Skains can address what's needing during pts appt today.  Please f/u

## 2016-07-05 NOTE — Patient Instructions (Addendum)
Medication Instructions:  Your physician recommends that you continue on your current medications as directed. Please refer to the Current Medication list given to you today.   Labwork: None ordered   Testing/Procedures: None ordered   Follow-Up: Your physician recommends that you schedule a follow-up appointment to be determined after discharge

## 2016-07-05 NOTE — H&P (Signed)
History and physical   Date:  07/03/2016   ID:  Omar Chan Phillippi, DOB [birth year], MRN [id number]  PCP:  [name],[name], MD     Cardiologist:   Donato SchultzMark Machi Whittaker, MD   Chief complaint: Shortness of breath  History of Present Illness:  Omar Chan Beyene is a 82 y.o. male former patient of Dr. Yevonne PaxBrackbill's who Is here today prior to potential knee operation with tachycardia, atrial fibrillation/flutter with rapid ventricular response, acute systolic heart failure, dyspnea.   He had a fall a few months ago that resulted in a stress fracture in his previously replaced left knee. This is been quite painful for him. He was post undergo surgery by Dr. Marya Fossaolen of orthopedic surgery however during the preoperative evaluation was noted to be tachycardic. He comes to my office today for further evaluation. He is sitting in a wheelchair, visibly mildly increased work of breathing. His wife is currently with him as well as his daughter who has been helping to take care of him. He has been struggling at home.   He was in the hospital in August with rapid ventricular response atrial fibrillation. His blood pressure on subsequent clinic visit was 70 systolic, weak, stopped his spironolactone and lisinopril.  He previously has not been on anticoagulation secondary to falls and GI bleeding. Echocardiogram showed mild LVH with normal LV function. Mild pulmonary hypertension.       Past Medical History:  Diagnosis Date  . Acute on chronic systolic CHF (congestive heart failure) (HCC)   . CHF (congestive heart failure) (HCC)   . Complication of anesthesia    last 2 surgeries "flatlined" in room 24 hrs after anesthesia.-2005 and ?  Marland Kitchen. COPD (chronic obstructive pulmonary disease) (HCC)    ?  Marland Kitchen. Dementia   . Diabetes mellitus   . Exogenous obesity   . GERD (gastroesophageal reflux disease)   . Hiatal hernia   . Hyperlipidemia   . Hypertension   . Microcytic anemia   .  Osteoarthritis   . Paroxysmal atrial fibrillation (HCC)    cardioversion 01/25/10  . Pneumonia    hx  . Seizures (HCC)    once yrs ago >20 yrs ? no med given         Past Surgical History:  Procedure Laterality Date  . CARDIOVASCULAR STRESS TEST  2007   No ischemia. EF 61%  . CARDIOVERSION  01/25/10  . CARDIOVERSION N/A 08/19/2013   Procedure: CARDIOVERSION;  Surgeon: Cassell Clementhomas Brackbill, MD;  [facility];  Service: Cardiovascular;  Laterality: N/A;  . ESOPHAGOGASTRODUODENOSCOPY N/A 06/02/2014   Procedure: ESOPHAGOGASTRODUODENOSCOPY (EGD);  Surgeon: Barrie FolkJohn C Hayes, MD;  [facility];  Service: Endoscopy;  Laterality: N/A;  . JOINT REPLACEMENT Right    x2 and then surgery to clea up  . TOTAL KNEE ARTHROPLASTY Left    B  . US ECHOCARDIOGRAPHY  February 2011   Normal EF; LAE and RVE    Current Medications:       Outpatient Medications Prior to Visit  Medication Sig Dispense Refill  . acetaminophen (TYLENOL) 500 MG tablet Take 1,000 mg by mouth every 6 (six) hours as needed. For pain    . atorvastatin (LIPITOR) 40 MG tablet Take 40 mg by mouth daily.    . carboxymethylcellulose (REFRESH TEARS) 0.5 % SOLN Place 1 drop into both eyes 3 (three) times daily as needed (for dry eyes).    Marland Kitchen. donepezil (ARICEPT) 10 MG tablet Take 1 tablet (10 mg total)  by mouth at bedtime. 90 tablet 3  . furosemide (LASIX) 40 MG tablet Take 60 mg by mouth daily.     Marland Kitchen glimepiride (AMARYL) 1 MG tablet Take 1 mg by mouth daily.    . pantoprazole (PROTONIX) 40 MG tablet Take 40 mg by mouth 2 (two) times daily.    Marland Kitchen tiotropium (SPIRIVA) 18 MCG inhalation capsule Place 18 mcg into inhaler and inhale daily.    . potassium chloride SA (K-DUR,KLOR-CON) 20 MEQ tablet Take 2 tablets (40 mEq total) by mouth daily. (Patient taking differently: Take 20 mEq by mouth daily. ) 60 tablet 0  . clopidogrel (PLAVIX) 75 MG tablet Take 1 tablet (75 mg total) by mouth daily. 90  tablet 1   No facility-administered medications prior to visit.      Allergies:   Aspirin and Trazodone and nefazodone   Social History        Social History  . Marital status: Married    Spouse name: [name]  . Number of children: 4  . Years of education: 12   Occupational History  . Retired         Social History Main Topics  . Smoking status: Former Smoker    Packs/day: 2.00    Quit date: 07/29/1981  . Smokeless tobacco: Never Used  . Alcohol use No  . Drug use: No  . Sexual activity: Yes       Other Topics Concern  . None      Social History Narrative   Lives with wife and daughter   Caffeine use: decaf coffee- 2-3 per day     Family History:  The patient's family history includes ALS in his brother; Cancer in his brother, mother, and sister; Hypertension in his mother; Pneumonia in his father.   ROS:   Please see the history of present illness.   Frequent falls, shortness of breath, knee pain, denies syncope, denies fevers, denies bleeding. ROS All other systems reviewed and are negative.   PHYSICAL EXAM:   VS:  BP 128/70   Pulse (!) 120   Ht 6' (1.829 m)   Wt 244 lb (110.7 kg)   BMI 33.09 kg/m   pulse was approximately 120 not 52. GEN: Well nourished, well developed, in no acute distress  HEENT: normal  Neck: no JVD, carotid bruits, or masses Cardiac: Tachycardic, fairly regular, no murmurs, rubs, or gallops, 2+ bilateral lower extremity edema  Respiratory:  Mildly increased work of breathing, mild crackles heard bilateral bases GI: soft, nontender, nondistended, + BS, overweight MS: no deformity or atrophy  Skin: warm and dry, no rash Neuro:  Alert and Oriented x 3, Strength and sensation are intact Psych: euthymic mood, full affect     Wt Readings from Last 3 Encounters:  07/29/2016 244 lb (110.7 kg)  07/01/16 230 lb (104.3 kg)  04/16/16 221 lb (100.2 kg)      Studies/Labs Reviewed:   EKG:  None today  Recent  Labs: 03/11/2016: ALT 16; B Natriuretic Peptide 591.6; TSH 3.338 07/01/2016: BUN 23; Creatinine, Ser 1.16; Hemoglobin 12.2; Platelets 210; Potassium 4.2; Sodium 143   Lipid Panel Labs (Brief)          Component Value Date/Time   CHOL 154 06/06/2011 0852   TRIG 115.0 06/06/2011 0852   HDL 48.90 06/06/2011 0852   CHOLHDL 3 06/06/2011 0852   VLDL 23.0 06/06/2011 0852   LDLCALC 82 06/06/2011 0852      Additional studies/ records that were reviewed today include:  Event monitor 09/12/15  Atrial fibrillation/flutter persistent  Occasional PVC's  No adverse pauses  Good rate control of AFIB/Flutter (56-91 BPM)  No cardiac rhythm explanation for syncope.  In reading [name] note, not on anticoagulation because of prior GI bleeding.  Donato SchultzSKAINS, Deaira Leckey, MD  Echocardiogram 09/12/15: - Left ventricle: The cavity size was normal. Wall thickness was  increased in a pattern of mild LVH. Systolic function was  moderately reduced. The estimated ejection fraction was in the  range of 35% to 40%. Diffuse hypokinesis. - Aortic valve: Valve mobility was restricted. - Left atrium: The atrium was severely dilated. - Right atrium: The atrium was mildly dilated.  Impressions:  - Moderate global reduction in LV function; biatrial enlargement;  trace MR and TR. Compared to 08/04/13, LV function is worse.   ASSESSMENT:    1. Acute on chronic systolic CHF (congestive heart failure) (HCC)   2. Permanent atrial fibrillation (HCC)   3. Dilated cardiomyopathy (HCC)      PLAN:  In order of problems listed above:  Acute on chronic systolic heart failure  - Admitting him to hospital  - IV diuresis 40 mg IV every 8 hours  - Start metoprolol XL 25 mg to help with rate control which will help with heart failure as well.  - Watch for signs of bradycardia  - Needs to be improved prior to thinking about surgery.  - Previously lisinopril and spironolactone were discontinued  because of marked hypotension.  Permanent atrial fibrillation  -He is not anticoagulation because of GI bleeding occult as well as frequent falls.  -Previously had had bradycardia with beta blocker and calcium channel blocker.  - We will be careful with Toprol.  Questionable history of TIA  - On Plavix  Hyperlipidemia  - I will go ahead and discontinue his gemfibrozil drug regimen especially at his age. He has not had any episodes of pancreatitis. Dr. Clarene DukeLittle will be checking his physical exam blood work in August.  Deconditioning  - Significant issue for him. Knee pain.  Preoperative cardiac risk stratification  - He needs to improve from a heart failure standpoint and from a rate control standpoint prior to proceeding with revision of knee prosthesis. I will send this note to Dr. Wadie Lessenowan's team.   Medication Adjustments/Labs and Tests Ordered: Current medicines are reviewed at length with the patient today.  Concerns regarding medicines are outlined above.  Medication changes, Labs and Tests ordered today are listed in the Patient Instructions below. Patient Instructions  Medication Instructions:  Your physician recommends that you continue on your current medications as directed. Please refer to the Current Medication list given to you today.   Labwork: None ordered   Testing/Procedures: None ordered   Follow-Up: Your physician recommends that you schedule a follow-up appointment to be determined after discharge         Signed, Donato SchultzMark Madia Carvell, MD  07/10/2016 3:28 PM    [facility] [address], [place], [place]  [address] Phone: [phone]; Fax: [phone]

## 2016-07-06 ENCOUNTER — Other Ambulatory Visit: Payer: Self-pay

## 2016-07-06 DIAGNOSIS — I482 Chronic atrial fibrillation: Secondary | ICD-10-CM

## 2016-07-06 DIAGNOSIS — I5021 Acute systolic (congestive) heart failure: Secondary | ICD-10-CM

## 2016-07-06 LAB — GLUCOSE, CAPILLARY
GLUCOSE-CAPILLARY: 120 mg/dL — AB (ref 65–99)
GLUCOSE-CAPILLARY: 158 mg/dL — AB (ref 65–99)
Glucose-Capillary: 101 mg/dL — ABNORMAL HIGH (ref 65–99)
Glucose-Capillary: 163 mg/dL — ABNORMAL HIGH (ref 65–99)

## 2016-07-06 LAB — BASIC METABOLIC PANEL
ANION GAP: 9 (ref 5–15)
BUN: 30 mg/dL — ABNORMAL HIGH (ref 6–20)
CO2: 25 mmol/L (ref 22–32)
Calcium: 8.7 mg/dL — ABNORMAL LOW (ref 8.9–10.3)
Chloride: 107 mmol/L (ref 101–111)
Creatinine, Ser: 1.36 mg/dL — ABNORMAL HIGH (ref 0.61–1.24)
GFR calc Af Amer: 52 mL/min — ABNORMAL LOW (ref >60)
GFR calc non Af Amer: 45 mL/min — ABNORMAL LOW (ref >60)
GLUCOSE: 140 mg/dL — AB (ref 65–99)
POTASSIUM: 4.4 mmol/L (ref 3.5–5.1)
Sodium: 141 mmol/L (ref 135–145)

## 2016-07-06 MED ORDER — FUROSEMIDE 10 MG/ML IJ SOLN
80.0000 mg | Freq: Two times a day (BID) | INTRAMUSCULAR | Status: DC
  Administered 2016-07-06 – 2016-07-07 (×2): 80 mg via INTRAVENOUS
  Filled 2016-07-06: qty 8

## 2016-07-06 MED ORDER — METOPROLOL SUCCINATE ER 25 MG PO TB24: 25.0000 mg | ORAL_TABLET | Freq: Every day | ORAL | Status: DC

## 2016-07-06 MED ORDER — METOPROLOL SUCCINATE ER 50 MG PO TB24
50.0000 mg | ORAL_TABLET | Freq: Every day | ORAL | Status: DC
  Administered 2016-07-07: 50 mg via ORAL
  Filled 2016-07-06: qty 1

## 2016-07-06 MED ORDER — FUROSEMIDE 10 MG/ML IJ SOLN: 80.0000 mg | Freq: Three times a day (TID) | INTRAMUSCULAR | Status: DC

## 2016-07-06 MED ORDER — METOPROLOL SUCCINATE ER 25 MG PO TB24
25.0000 mg | ORAL_TABLET | Freq: Once | ORAL | Status: AC
  Administered 2016-07-06: 25 mg via ORAL
  Filled 2016-07-06: qty 1

## 2016-07-06 NOTE — Progress Notes (Addendum)
Patient Name: [name] D Verge Date of Encounter: 07/06/2016  [facility]  Hospital Problem List     Active Problems:   Acute systolic heart failure (HCC)     Subjective   Still mild SOB, no CP. Poor output. Here with his family.  Inpatient Medications    Scheduled Meds: . atorvastatin  40 mg Oral Daily  . donepezil  10 mg Oral QHS  . furosemide  40 mg Intravenous Q8H  . glimepiride  1 mg Oral Q breakfast  . heparin  5,000 Units Subcutaneous Q8H  . metoprolol succinate  25 mg Oral Daily  . pantoprazole  40 mg Oral BID  . potassium chloride  20 mEq Oral BID  . sodium chloride flush  3 mL Intravenous Q12H  . tiotropium  18 mcg Inhalation Daily   Continuous Infusions:  PRN Meds: sodium chloride, acetaminophen, ondansetron (ZOFRAN) IV, sodium chloride flush   Vital Signs    Vitals:   07/04/2016 2055 07/14/2016 2352 07/06/16 0402 07/06/16 0924  BP: 99/67 108/85 111/83 107/82  Pulse: (!) 115 (!) 113 (!) 115 (!) 115  Resp: 18 18 18 18   Temp: 98 F (36.7 C) 97.4 F (36.3 C) 97.8 F (36.6 C) 97.4 F (36.3 C)  TempSrc: Oral Oral Oral Oral  SpO2: 96% 93% 94% 94%  Weight:   245 lb 6 oz (111.3 kg)   Height:        Intake/Output Summary (Last 24 hours) at 07/06/16 1113 Last data filed at 07/06/16 0900  Gross per 24 hour  Intake              480 ml  Output              375 ml  Net              105 ml   Filed Weights   06/30/2016 1653 07/06/16 0402  Weight: 240 lb 1.3 oz (108.9 kg) 245 lb 6 oz (111.3 kg)    Physical Exam    GEN: Well nourished, well developed, in no acute distress. Elderly  HEENT: Grossly normal.  Neck: Supple, no JVD, carotid bruits, or masses. Cardiac: RRR, no murmurs, rubs, or gallops. No clubbing, cyanosis, edema.  Radials/DP/PT 2+ and equal bilaterally.  Respiratory:  Respirations regular and unlabored, clear to auscultation bilaterally. GI: Soft, nontender, nondistended, BS + x 4. MS: no deformity or atrophy. Skin: warm and  dry, no rash. 3+ edema BLE Neuro:  Strength and sensation are intact. Psych: AAOx3.  Normal affect.  Labs    CBC  Recent Labs  07/17/2016 1658  WBC 6.9  HGB 12.2*  HCT 39.1  MCV 95.4  PLT 225   Basic Metabolic Panel  Recent Labs  06/29/2016 1658 07/06/16 0432  NA 143 141  K 4.5 4.4  CL 108 107  CO2 25 25  GLUCOSE 100* 140*  BUN 25* 30*  CREATININE 1.28* 1.36*  CALCIUM 9.0 8.7*   Liver Function Tests  Recent Labs  07/08/2016 1658  AST 19  ALT 14*  ALKPHOS 149*  BILITOT 0.8  PROT 6.8  ALBUMIN 3.8   Thyroid Function Tests  Recent Labs  07/26/2016 1658  TSH 6.496*    Telemetry    No pauses, atrial fibrillation/flutter heart rate 115 - Personally Reviewed  ECG    07/06/16 Atrial fib/flutter heart rate 115 - Personally Reviewed  Radiology    No results found.  Cardiac Studies   Echocardiogram: 03/12/16 - Left ventricle: The  cavity size was normal. There was mild focal   basal hypertrophy of the septum. Systolic function was moderately   to severely reduced. The estimated ejection fraction was in the   range of 30% to 35%. Diffuse hypokinesis. Features are consistent   with a pseudonormal left ventricular filling pattern, with   concomitant abnormal relaxation and increased filling pressure   (grade 2 diastolic dysfunction). - Ventricular septum: The contour showed diastolic flattening. - Aortic valve: Trileaflet; moderately thickened, mildly calcified   leaflets. There was mild regurgitation. - Left atrium: The atrium was moderately dilated. - Right ventricle: The cavity size was mildly dilated. Wall   thickness was normal. Systolic function was mildly reduced. - Right atrium: The atrium was mildly dilated. - Pulmonary arteries: Systolic pressure was moderately increased.   PA peak pressure: 60 mm Hg (S). - Pericardium, extracardiac: A trivial pericardial effusion was   identified circumferential to the heart. Features were not   consistent with  tamponade physiology.  Impressions:  - Compared to the prior study, there has been no significant   interval change.  Patient Profile     82 year old male with acute on chronic systolic heart failure, permanent atrial fibrillation with rapid ventricular response, prior history of TIA, hyperlipidemia, deconditioning with stress fracture of left knee following fall, not on chronic anticoagulation here to optimize cardiac status prior to elective knee joint revision.  Assessment & Plan    Acute on chronic systolic heart failure  - IV diuresis 40Increase to 80 mg IV BID-poor output ?Has condom cath. Weight 245 up from 240? - metoprolol XL 25 mg to help with rate control which will help with heart failure as well.I will increase this to 50 mg once a day. (I will give him an extra 25 mg now to match 50 today). Watch for any signs of bradycardia. He is still tachycardic with heart rates in the 110s to 120s. - Watch for signs of bradycardia - Needs to be improved prior to surgery. - Previously lisinopril and spironolactone were discontinued because of marked hypotension.  Permanentatrial fibrillation -He is not anticoagulationbecause of GI bleeding occult as well as frequent falls. -Previously had had bradycardia with beta blocker and calcium channel blocker. - We will be careful with Toprol.  Questionable history of TIA - On Plavix  Hyperlipidemia -Continue atorvastatin 40  Deconditioning - Significant issue for him. Knee pain.  Preoperative cardiac risk stratification - He needs to improve from a heart failure standpoint and from a rate control standpoint prior to proceeding with revision of knee prosthesis. I will send this note to Dr. Wadie Lessenowan's team.   Signed, Donato SchultzMark Skains, MD  07/06/2016, 11:13 AM

## 2016-07-07 DIAGNOSIS — I481 Persistent atrial fibrillation: Secondary | ICD-10-CM

## 2016-07-07 LAB — BASIC METABOLIC PANEL
Anion gap: 11 (ref 5–15)
BUN: 36 mg/dL — AB (ref 6–20)
CALCIUM: 8.7 mg/dL — AB (ref 8.9–10.3)
CHLORIDE: 103 mmol/L (ref 101–111)
CO2: 24 mmol/L (ref 22–32)
CREATININE: 1.48 mg/dL — AB (ref 0.61–1.24)
GFR calc non Af Amer: 40 mL/min — ABNORMAL LOW (ref >60)
GFR, EST AFRICAN AMERICAN: 47 mL/min — AB (ref >60)
Glucose, Bld: 128 mg/dL — ABNORMAL HIGH (ref 65–99)
Potassium: 4.3 mmol/L (ref 3.5–5.1)
Sodium: 138 mmol/L (ref 135–145)

## 2016-07-07 LAB — GLUCOSE, CAPILLARY
GLUCOSE-CAPILLARY: 110 mg/dL — AB (ref 65–99)
GLUCOSE-CAPILLARY: 141 mg/dL — AB (ref 65–99)
Glucose-Capillary: 101 mg/dL — ABNORMAL HIGH (ref 65–99)
Glucose-Capillary: 94 mg/dL (ref 65–99)

## 2016-07-07 MED ORDER — METOPROLOL SUCCINATE ER 50 MG PO TB24
50.0000 mg | ORAL_TABLET | Freq: Once | ORAL | Status: DC
  Filled 2016-07-07: qty 1

## 2016-07-07 MED ORDER — METOPROLOL SUCCINATE ER 100 MG PO TB24
100.0000 mg | ORAL_TABLET | Freq: Every day | ORAL | Status: DC
  Administered 2016-07-08: 100 mg via ORAL
  Filled 2016-07-07: qty 1

## 2016-07-07 MED ORDER — FUROSEMIDE 10 MG/ML IJ SOLN
80.0000 mg | Freq: Three times a day (TID) | INTRAMUSCULAR | Status: DC
  Administered 2016-07-07 – 2016-07-08 (×5): 80 mg via INTRAVENOUS
  Filled 2016-07-07 (×5): qty 8

## 2016-07-07 MED ORDER — POLYVINYL ALCOHOL 1.4 % OP SOLN
1.0000 [drp] | OPHTHALMIC | Status: DC | PRN
  Administered 2016-07-08: 1 [drp] via OPHTHALMIC
  Filled 2016-07-07: qty 15

## 2016-07-07 NOTE — Progress Notes (Signed)
Patient Name: Omar Chan Date of Encounter: 07/07/2016  [facility]  Hospital Problem List     Active Problems:   Acute systolic heart failure (HCC)     Subjective   Still mild SOB, no CP. Poor output. Here with his family.  Inpatient Medications    Scheduled Meds: . atorvastatin  40 mg Oral Daily  . donepezil  10 mg Oral QHS  . furosemide  80 mg Intravenous BID  . glimepiride  1 mg Oral Q breakfast  . heparin  5,000 Units Subcutaneous Q8H  . metoprolol succinate  50 mg Oral Daily  . pantoprazole  40 mg Oral BID  . potassium chloride  20 mEq Oral BID  . sodium chloride flush  3 mL Intravenous Q12H  . tiotropium  18 mcg Inhalation Daily   Continuous Infusions:  PRN Meds: sodium chloride, acetaminophen, ondansetron (ZOFRAN) IV, sodium chloride flush   Vital Signs    Vitals:   07/06/16 2014 07/07/16 0531 07/07/16 0755 07/07/16 0839  BP: 97/75 115/74  105/77  Pulse: (!) 110 (!) 112  (!) 11  Resp: 20 18    Temp: 97.3 F (36.3 C) 97.3 F (36.3 C)    TempSrc: Oral Oral    SpO2: 96% 95% 95%   Weight:  243 lb 1.6 oz (110.3 kg)    Height:        Intake/Output Summary (Last 24 hours) at 07/07/16 1117 Last data filed at 07/07/16 0057  Gross per 24 hour  Intake              680 ml  Output              550 ml  Net              130 ml   Filed Weights   06/30/2016 1653 07/06/16 0402 07/07/16 0531  Weight: 240 lb 1.3 oz (108.9 kg) 245 lb 6 oz (111.3 kg) 243 lb 1.6 oz (110.3 kg)    Physical Exam    GEN: Well nourished, well developed, in no acute distress. Elderly  HEENT: Grossly normal.  Neck: Supple, no JVD, carotid bruits, or masses. Cardiac: RRR, no murmurs, rubs, or gallops. No clubbing, cyanosis, edema.  Radials/DP/PT 2+ and equal bilaterally.  Respiratory:  Respirations regular and unlabored, clear to auscultation bilaterally. GI: Soft, nontender, nondistended, BS + x 4. MS: no deformity or atrophy. Skin: warm and dry, no rash. 3+  edema BLE Neuro:  Strength and sensation are intact. Psych: AAOx3.  Normal affect.  Labs    CBC  Recent Labs  07/10/2016 1658  WBC 6.9  HGB 12.2*  HCT 39.1  MCV 95.4  PLT 225   Basic Metabolic Panel  Recent Labs  07/06/16 0432 07/07/16 0211  NA 141 138  K 4.4 4.3  CL 107 103  CO2 25 24  GLUCOSE 140* 128*  BUN 30* 36*  CREATININE 1.36* 1.48*  CALCIUM 8.7* 8.7*   Liver Function Tests  Recent Labs  07/19/2016 1658  AST 19  ALT 14*  ALKPHOS 149*  BILITOT 0.8  PROT 6.8  ALBUMIN 3.8   Thyroid Function Tests  Recent Labs  07/20/2016 1658  TSH 6.496*    Telemetry    No pauses, atrial fibrillation/flutter heart rate 115 - Personally Reviewed  ECG    07/06/16 Atrial fib/flutter heart rate 115 - Personally Reviewed  Radiology    No results found.  Cardiac Studies   Echocardiogram: 03/12/16 - Left ventricle:  The cavity size was normal. There was mild focal   basal hypertrophy of the septum. Systolic function was moderately   to severely reduced. The estimated ejection fraction was in the   range of 30% to 35%. Diffuse hypokinesis. Features are consistent   with a pseudonormal left ventricular filling pattern, with   concomitant abnormal relaxation and increased filling pressure   (grade 2 diastolic dysfunction). - Ventricular septum: The contour showed diastolic flattening. - Aortic valve: Trileaflet; moderately thickened, mildly calcified   leaflets. There was mild regurgitation. - Left atrium: The atrium was moderately dilated. - Right ventricle: The cavity size was mildly dilated. Wall   thickness was normal. Systolic function was mildly reduced. - Right atrium: The atrium was mildly dilated. - Pulmonary arteries: Systolic pressure was moderately increased.   PA peak pressure: 60 mm Hg (S). - Pericardium, extracardiac: A trivial pericardial effusion was   identified circumferential to the heart. Features were not   consistent with tamponade  physiology.  Impressions:  - Compared to the prior study, there has been no significant   interval change.  Patient Profile     82 year old male with acute on chronic systolic heart failure, permanent atrial fibrillation with rapid ventricular response, prior history of TIA, hyperlipidemia, deconditioning with stress fracture of left knee following fall, not on chronic anticoagulation here to optimize cardiac status prior to elective knee joint revision.  Assessment & Plan    Acute on chronic systolic heart failure  - IV diuresis 80 mg IV BID-poor output increase to TID ?Has condom cath. Weight 243 up from 240? - metoprolol XL  increase this to 100 mg once a day. (I will give him an extra 50mg  now to match 50 today). Watch for any signs of bradycardia. He is still tachycardic with heart rates in the 110s to 120s. - Watch for signs of bradycardia - Needs to be improved prior to surgery. - Previously lisinopril and spironolactone were discontinued because of marked hypotension.  Permanentatrial fibrillation -He is not anticoagulationbecause of GI bleeding occult as well as frequent falls. -Previously had had bradycardia with beta blocker and calcium channel blocker. - We will be careful with Toprol.  Questionable history of TIA - On Plavix  Hyperlipidemia -Continue atorvastatin 40  Deconditioning - Significant issue for him. Knee pain.  Preoperative cardiac risk stratification - He needs to improve from a heart failure standpoint and from a rate control standpoint prior to proceeding with revision of knee prosthesis. I will send this note to Dr. Wadie Lessenowan's team.   Signed, Donato SchultzMark Quenton Recendez, MD  07/07/2016, 11:17 AM

## 2016-07-08 ENCOUNTER — Encounter (HOSPITAL_COMMUNITY): Admission: RE | Payer: Self-pay | Source: Ambulatory Visit

## 2016-07-08 ENCOUNTER — Inpatient Hospital Stay (HOSPITAL_COMMUNITY): Admission: RE | Admit: 2016-07-08 | Payer: Medicare Other | Source: Ambulatory Visit | Admitting: Orthopedic Surgery

## 2016-07-08 LAB — GLUCOSE, CAPILLARY
GLUCOSE-CAPILLARY: 99 mg/dL (ref 65–99)
GLUCOSE-CAPILLARY: 97 mg/dL (ref 65–99)
Glucose-Capillary: 139 mg/dL — ABNORMAL HIGH (ref 65–99)
Glucose-Capillary: 81 mg/dL (ref 65–99)
Glucose-Capillary: 122 mg/dL — ABNORMAL HIGH (ref 65–99)

## 2016-07-08 LAB — BASIC METABOLIC PANEL
Anion gap: 13 (ref 5–15)
BUN: 39 mg/dL — AB (ref 6–20)
CO2: 23 mmol/L (ref 22–32)
Calcium: 8.6 mg/dL — ABNORMAL LOW (ref 8.9–10.3)
Chloride: 104 mmol/L (ref 101–111)
Creatinine, Ser: 1.42 mg/dL — ABNORMAL HIGH (ref 0.61–1.24)
GFR calc Af Amer: 49 mL/min — ABNORMAL LOW (ref >60)
GFR, EST NON AFRICAN AMERICAN: 43 mL/min — AB (ref >60)
GLUCOSE: 94 mg/dL (ref 65–99)
POTASSIUM: 4 mmol/L (ref 3.5–5.1)
Sodium: 140 mmol/L (ref 135–145)

## 2016-07-08 SURGERY — TOTAL KNEE REVISION
Anesthesia: Spinal | Laterality: Left

## 2016-07-08 MED ORDER — AMIODARONE HCL IN DEXTROSE 360-4.14 MG/200ML-% IV SOLN
60.0000 mg/h | INTRAVENOUS | Status: AC
  Administered 2016-07-08: 60 mg/h via INTRAVENOUS
  Filled 2016-07-08: qty 200

## 2016-07-08 MED ORDER — AMIODARONE HCL IN DEXTROSE 360-4.14 MG/200ML-% IV SOLN
30.0000 mg/h | INTRAVENOUS | Status: DC
  Administered 2016-07-08 – 2016-07-09 (×2): 30 mg/h via INTRAVENOUS
  Filled 2016-07-08 (×2): qty 200

## 2016-07-08 MED ORDER — AMIODARONE LOAD VIA INFUSION
150.0000 mg | Freq: Once | INTRAVENOUS | Status: AC
  Administered 2016-07-08: 150 mg via INTRAVENOUS
  Filled 2016-07-08: qty 83.34

## 2016-07-08 MED ORDER — GLIMEPIRIDE 1 MG PO TABS
1.0000 mg | ORAL_TABLET | Freq: Every day | ORAL | Status: DC
  Administered 2016-07-08: 1 mg via ORAL
  Filled 2016-07-08 (×2): qty 1

## 2016-07-08 NOTE — Progress Notes (Signed)
Patient Name: Omar Chan Date of Encounter: 07/08/2016  [facility]  H[facility] Problem List     Active Problems:   Acute systolic heart failure (HCC)     Subjective   Still mild SOB, no CP. Here with his family. Had some confusion. Trouble sleeping.   Inpatient Medications    Scheduled Meds: . amiodarone  150 mg Intravenous Once  . atorvastatin  40 mg Oral Daily  . donepezil  10 mg Oral QHS  . furosemide  80 mg Intravenous TID  . glimepiride  1 mg Oral Q breakfast  . heparin  5,000 Units Subcutaneous Q8H  . metoprolol succinate  100 mg Oral Daily  . metoprolol succinate  50 mg Oral Once  . pantoprazole  40 mg Oral BID  . potassium chloride  20 mEq Oral BID  . sodium chloride flush  3 mL Intravenous Q12H  . tiotropium  18 mcg Inhalation Daily   Continuous Infusions: . amiodarone     Followed by  . amiodarone     PRN Meds: sodium chloride, acetaminophen, ondansetron (ZOFRAN) IV, polyvinyl alcohol, sodium chloride flush   Vital Signs    Vitals:   07/07/16 1527 07/07/16 1935 07/08/16 0430 07/08/16 0759  BP: 97/69 93/64 114/81   Pulse: (!) 113 (!) 111 (!) 113   Resp:  20 18   Temp:  97.5 F (36.4 C) 97.8 F (36.6 C)   TempSrc:  Oral Oral   SpO2: 94% 100% 98% 98%  Weight:   242 lb 9.6 oz (110 kg)   Height:        Intake/Output Summary (Last 24 hours) at 07/08/16 0824 Last data filed at 07/08/16 0443  Gross per 24 hour  Intake             1070 ml  Output             3276 ml  Net            -2206 ml   Filed Weights   07/06/16 0402 07/07/16 0531 07/08/16 0430  Weight: 245 lb 6 oz (111.3 kg) 243 lb 1.6 oz (110.3 kg) 242 lb 9.6 oz (110 kg)    Physical Exam    GEN: Well nourished, well developed, in no acute distress. Elderly  HEENT: Grossly normal.  Neck: Supple, no JVD, carotid bruits, or masses. Cardiac: RRR, no murmurs, rubs, or gallops. No clubbing, cyanosis, edema.  Radials/DP/PT 2+ and equal bilaterally.  Respiratory:   Respirations regular and unlabored, clear to auscultation bilaterally. GI: Soft, nontender, nondistended, BS + x 4. MS: no deformity or atrophy. Skin: warm and dry, no rash. 1+ edema BLE (improved in bed) Neuro:  Strength and sensation are intact. Psych: AAOx3.  Normal affect.  Labs    CBC  Recent Labs  07/10/2016 1658  WBC 6.9  HGB 12.2*  HCT 39.1  MCV 95.4  PLT 225   Basic Metabolic Panel  Recent Labs  07/07/16 0211 07/08/16 0413  NA 138 140  K 4.3 4.0  CL 103 104  CO2 24 23  GLUCOSE 128* 94  BUN 36* 39*  CREATININE 1.48* 1.42*  CALCIUM 8.7* 8.6*   Liver Function Tests  Recent Labs  07/21/2016 1658  AST 19  ALT 14*  ALKPHOS 149*  BILITOT 0.8  PROT 6.8  ALBUMIN 3.8   Thyroid Function Tests  Recent Labs  07/12/2016 1658  TSH 6.496*    Telemetry    No pauses, atrial fibrillation/flutter heart rate  115 - Personally Reviewed  ECG    07/06/16 Atrial fib/flutter heart rate 115 - Personally Reviewed  Radiology    No results found.  Cardiac Studies   Echocardiogram: 03/12/16 - Left ventricle: The cavity size was normal. There was mild focal   basal hypertrophy of the septum. Systolic function was moderately   to severely reduced. The estimated ejection fraction was in the   range of 30% to 35%. Diffuse hypokinesis. Features are consistent   with a pseudonormal left ventricular filling pattern, with   concomitant abnormal relaxation and increased filling pressure   (grade 2 diastolic dysfunction). - Ventricular septum: The contour showed diastolic flattening. - Aortic valve: Trileaflet; moderately thickened, mildly calcified   leaflets. There was mild regurgitation. - Left atrium: The atrium was moderately dilated. - Right ventricle: The cavity size was mildly dilated. Wall   thickness was normal. Systolic function was mildly reduced. - Right atrium: The atrium was mildly dilated. - Pulmonary arteries: Systolic pressure was moderately increased.    PA peak pressure: 60 mm Hg (S). - Pericardium, extracardiac: A trivial pericardial effusion was   identified circumferential to the heart. Features were not   consistent with tamponade physiology.  Impressions:  - Compared to the prior study, there has been no significant   interval change.  Patient Profile     82 year old male with acute on chronic systolic heart failure, permanent atrial fibrillation with rapid ventricular response, prior history of TIA, hyperlipidemia, deconditioning with stress fracture of left knee following fall, not on chronic anticoagulation here to optimize cardiac status prior to elective knee joint revision.  Assessment & Plan    Delirium  - common in elderly in hospital.   - spoke with family  Acute on chronic systolic heart failure  - IV diuresis 80 mg IV BID-poor output increase to TID ?Has condom cath. Weight 243 up from 240? Creat stable.  - metoprolol XL 100 mg once a day. Watch for any signs of bradycardia. He is still tachycardic with heart rates in the 110s to 120s. - Watch for signs of bradycardia - Needs to be improved prior to surgery. - Previously lisinopril and spironolactone were discontinued because of marked hypotension.  Permanentatrial fibrillation -Will start IV amiodarone. Not having success with metoprolol. May need to try cardioversion (has had in the past). Daughter aware that we may see bradycardia again. HR incessant 115. If we do perform cardioversion will likely give course of anticoagulation (perhaps short term) -He is not anticoagulationbecause of GI bleeding occult as well as frequent falls. -Previously had had bradycardia with beta blocker and calcium channel blocker. - We will be careful with Toprol. Now at 100.   Questionable history of TIA - On Plavix  Hyperlipidemia -Continue atorvastatin 40  Deconditioning - Significant issue for him. Knee pain.  Preoperative cardiac risk stratification -  He needs to improve from a heart failure standpoint and from a rate control standpoint prior to proceeding with revision of knee prosthesis. Dr. Wadie Lessenowan's. In all, is a poor candidate for any surgery at this point.   Dispo  - has help at home (aid comes in). Wife is exhausted.   Signed, Donato SchultzMark Uel Davidow, MD  07/08/2016, 8:24 AM

## 2016-07-09 LAB — BASIC METABOLIC PANEL
ANION GAP: 16 — AB (ref 5–15)
BUN: 50 mg/dL — ABNORMAL HIGH (ref 6–20)
CALCIUM: 8.6 mg/dL — AB (ref 8.9–10.3)
CO2: 18 mmol/L — AB (ref 22–32)
CREATININE: 2.15 mg/dL — AB (ref 0.61–1.24)
Chloride: 103 mmol/L (ref 101–111)
GFR calc non Af Amer: 26 mL/min — ABNORMAL LOW (ref >60)
GFR, EST AFRICAN AMERICAN: 30 mL/min — AB (ref >60)
Glucose, Bld: 67 mg/dL (ref 65–99)
Potassium: 6.4 mmol/L (ref 3.5–5.1)
SODIUM: 137 mmol/L (ref 135–145)

## 2016-07-10 ENCOUNTER — Telehealth: Payer: Self-pay | Admitting: Cardiology

## 2016-07-10 NOTE — Telephone Encounter (Signed)
Original d/c received yesterday 12/12- sent Dr.Skains staff message to see when he will be available to come sign.

## 2016-07-11 NOTE — Telephone Encounter (Signed)
Dr.Skians has signed and completed d/c on patient called [facility] [phone]) home made them aware ready for pick up. Faxed copy also to Pat at [phone].

## 2016-07-29 NOTE — Progress Notes (Signed)
Pt. apnic and non-responsive.  Breath sounds and heart rate auscultated by 2 RNs. On call MD for cardiology contacted to inform of patients status. Pts. TOD [phone]. Pts. Daughter, Omar Chan, at bedside.

## 2016-07-29 NOTE — Progress Notes (Signed)
Pt. With HR in the 40's-50's on Amiodarone drip. On call MD for Cardiology made aware. No new orders at this time. RN will continue to monitor the pt. For changes in condition. Omar Chan, Cheryll DessertKaren Cherrell

## 2016-07-29 NOTE — Discharge Summary (Signed)
    DEATH SUMMARY  Patient ID: Omar Chan MRN: [id number] DOB/AGE: 81-Jun-1927 81 y.o.  Admit date: 07/02/2016 Discharge date: 06/30/2016  TIME OF DEATH 0432  Primary Discharge Diagnosis: Acute on chronic systolic heart failure Secondary Discharge Diagnosis: Atrial fibrillation/flutter    Hospital Course: 83 year old male former patient of Dr. Yevonne PaxBrackbill's who was admitted from clinic on 07/05/16 with acute on chronic systolic heart failure, increased work of breathing, atrial fibrillation with incessant pulse of approximately 120 bpm. He was diuresed with Lasix starting at 40 mg IV 3 times a day and did not respond. This was increased to 80 mg IV twice a day and then to 3 times a day. Output improved for total of about 3 L out.  We increased his Toprol throughout the admission to try to help reduce his tachycardia with the previous knowledge of bradycardia in the past. After increasing to 100 mg daily and with no significant decrease in his incessant tachycardia, amiodarone IV was then added on 07/08/16.  He reported some mild confusion early in the morning on 07/08/16 which eventually cleared throughout the day. Discussed with family. There was an open and honest discussion about their realistic possibilities of him being well enough to undergo knee surgery.  Late in the evening/in the early morning of 06/29/2016 he began to demonstrate periods of bradycardia. His family was at bedside. He was able to talk with them and say his goodbyes. He asked them to pray for him. Comfortably, he died at 4:32 AM on 07/20/2016. He was, nonresponsive, without pulse. Nurses were present.  I called Talmadge ChadKathy Johnson, his daughter at [phone] and expressed my condolences. She was appreciative.   Discharge Exam: By nurse's exam at 4: 32 AM, he was apneic, pulseless, no breathing. Family was at bedside.  Labs:   Lab Results  Component Value Date   WBC 6.9 September 11, 2015   HGB 12.2 (L) September 11, 2015   HCT 39.1  September 11, 2015   MCV 95.4 September 11, 2015   PLT 225 September 11, 2015    Recent Labs Lab Jan 17, 2016 1658  07/24/2016 0314  NA 143  < > 137  K 4.5  < > 6.4*  CL 108  < > 103  CO2 25  < > 18*  BUN 25*  < > 50*  CREATININE 1.28*  < > 2.15*  CALCIUM 9.0  < > 8.6*  PROT 6.8  --   --   BILITOT 0.8  --   --   ALKPHOS 149*  --   --   ALT 14*  --   --   AST 19  --   --   GLUCOSE 100*  < > 67  < > = values in this interval not displayed.  Omar Chan 07/26/2016, 7:58 AM

## 2016-07-29 DEATH — deceased

## 2016-10-11 ENCOUNTER — Ambulatory Visit (INDEPENDENT_AMBULATORY_CARE_PROVIDER_SITE_OTHER): Payer: Medicare Other | Admitting: Ophthalmology

## 2016-10-30 ENCOUNTER — Ambulatory Visit: Payer: Medicare Other | Admitting: Adult Health
